# Patient Record
Sex: Male | Born: 1970 | Race: Black or African American | Hispanic: No | Marital: Married | Smoking: Never smoker
Health system: Southern US, Community
[De-identification: ages and names within clinical notes are randomized; demographics above are authoritative.]

## PROBLEM LIST (undated history)

## (undated) DIAGNOSIS — Z87442 Personal history of urinary calculi: Secondary | ICD-10-CM

## (undated) DIAGNOSIS — R7303 Prediabetes: Secondary | ICD-10-CM

## (undated) DIAGNOSIS — K222 Esophageal obstruction: Secondary | ICD-10-CM

## (undated) DIAGNOSIS — K760 Fatty (change of) liver, not elsewhere classified: Secondary | ICD-10-CM

## (undated) DIAGNOSIS — I10 Essential (primary) hypertension: Secondary | ICD-10-CM

## (undated) DIAGNOSIS — F419 Anxiety disorder, unspecified: Secondary | ICD-10-CM

## (undated) DIAGNOSIS — M7989 Other specified soft tissue disorders: Secondary | ICD-10-CM

## (undated) DIAGNOSIS — N189 Chronic kidney disease, unspecified: Secondary | ICD-10-CM

## (undated) DIAGNOSIS — K219 Gastro-esophageal reflux disease without esophagitis: Secondary | ICD-10-CM

## (undated) DIAGNOSIS — M2550 Pain in unspecified joint: Secondary | ICD-10-CM

## (undated) DIAGNOSIS — E7800 Pure hypercholesterolemia, unspecified: Secondary | ICD-10-CM

## (undated) DIAGNOSIS — G4730 Sleep apnea, unspecified: Secondary | ICD-10-CM

## (undated) HISTORY — DX: Esophageal obstruction: K22.2

## (undated) HISTORY — DX: Prediabetes: R73.03

## (undated) HISTORY — PX: KNEE ARTHROPLASTY: SHX992

## (undated) HISTORY — DX: Anxiety disorder, unspecified: F41.9

## (undated) HISTORY — DX: Sleep apnea, unspecified: G47.30

## (undated) HISTORY — DX: Personal history of urinary calculi: Z87.442

## (undated) HISTORY — DX: Pain in unspecified joint: M25.50

## (undated) HISTORY — DX: Pure hypercholesterolemia, unspecified: E78.00

## (undated) HISTORY — DX: Fatty (change of) liver, not elsewhere classified: K76.0

## (undated) HISTORY — DX: Chronic kidney disease, unspecified: N18.9

## (undated) HISTORY — DX: Other specified soft tissue disorders: M79.89

---

## 2000-04-26 ENCOUNTER — Ambulatory Visit (HOSPITAL_BASED_OUTPATIENT_CLINIC_OR_DEPARTMENT_OTHER): Admission: RE | Admit: 2000-04-26 | Discharge: 2000-04-26 | Payer: Self-pay | Admitting: Orthopaedic Surgery

## 2002-07-30 ENCOUNTER — Encounter: Payer: Self-pay | Admitting: Emergency Medicine

## 2002-07-30 ENCOUNTER — Emergency Department (HOSPITAL_COMMUNITY): Admission: EM | Admit: 2002-07-30 | Discharge: 2002-07-30 | Payer: Self-pay | Admitting: Emergency Medicine

## 2004-11-03 ENCOUNTER — Ambulatory Visit: Payer: Self-pay | Admitting: Family Medicine

## 2006-01-23 ENCOUNTER — Ambulatory Visit: Payer: Self-pay | Admitting: Internal Medicine

## 2006-02-19 ENCOUNTER — Ambulatory Visit: Payer: Self-pay | Admitting: Family Medicine

## 2006-03-06 ENCOUNTER — Ambulatory Visit: Payer: Self-pay | Admitting: Gastroenterology

## 2006-03-16 ENCOUNTER — Encounter (INDEPENDENT_AMBULATORY_CARE_PROVIDER_SITE_OTHER): Payer: Self-pay | Admitting: Specialist

## 2006-03-16 ENCOUNTER — Ambulatory Visit: Payer: Self-pay | Admitting: Gastroenterology

## 2006-03-16 HISTORY — PX: COLONOSCOPY: SHX174

## 2006-08-01 ENCOUNTER — Ambulatory Visit: Payer: Self-pay | Admitting: Family Medicine

## 2007-07-12 ENCOUNTER — Encounter: Payer: Self-pay | Admitting: Internal Medicine

## 2008-03-10 ENCOUNTER — Ambulatory Visit: Payer: Self-pay | Admitting: Family Medicine

## 2008-03-10 DIAGNOSIS — K219 Gastro-esophageal reflux disease without esophagitis: Secondary | ICD-10-CM | POA: Insufficient documentation

## 2008-03-10 DIAGNOSIS — R1031 Right lower quadrant pain: Secondary | ICD-10-CM | POA: Insufficient documentation

## 2008-03-10 DIAGNOSIS — Z8679 Personal history of other diseases of the circulatory system: Secondary | ICD-10-CM | POA: Insufficient documentation

## 2008-03-10 DIAGNOSIS — Z87442 Personal history of urinary calculi: Secondary | ICD-10-CM | POA: Insufficient documentation

## 2008-04-14 DIAGNOSIS — K649 Unspecified hemorrhoids: Secondary | ICD-10-CM | POA: Insufficient documentation

## 2008-04-15 ENCOUNTER — Ambulatory Visit: Payer: Self-pay | Admitting: Gastroenterology

## 2008-04-15 DIAGNOSIS — K625 Hemorrhage of anus and rectum: Secondary | ICD-10-CM | POA: Insufficient documentation

## 2009-07-12 ENCOUNTER — Telehealth: Payer: Self-pay | Admitting: Family Medicine

## 2009-09-21 ENCOUNTER — Ambulatory Visit: Payer: Self-pay | Admitting: Family Medicine

## 2009-09-21 DIAGNOSIS — I10 Essential (primary) hypertension: Secondary | ICD-10-CM | POA: Insufficient documentation

## 2009-12-06 ENCOUNTER — Ambulatory Visit: Payer: Self-pay | Admitting: Family Medicine

## 2009-12-06 LAB — CONVERTED CEMR LAB
Bilirubin Urine: NEGATIVE
Blood in Urine, dipstick: NEGATIVE
Glucose, Urine, Semiquant: NEGATIVE
Ketones, urine, test strip: NEGATIVE
Nitrite: NEGATIVE
Specific Gravity, Urine: >=1.03
Urobilinogen, UA: 0.2
WBC Urine, dipstick: NEGATIVE
pH: 6.5

## 2009-12-09 LAB — CONVERTED CEMR LAB
ALT: 33 units/L (ref 0–53)
AST: 29 units/L (ref 0–37)
Albumin: 4.3 g/dL (ref 3.5–5.2)
Alkaline Phosphatase: 63 units/L (ref 39–117)
BUN: 8 mg/dL (ref 6–23)
Basophils Absolute: 0.1 10*3/uL (ref 0.0–0.1)
Basophils Relative: 0.7 % (ref 0.0–3.0)
Bilirubin, Direct: 0 mg/dL (ref 0.0–0.3)
CO2: 28 meq/L (ref 19–32)
Calcium: 9.3 mg/dL (ref 8.4–10.5)
Chloride: 110 meq/L (ref 96–112)
Cholesterol: 193 mg/dL (ref 0–200)
Creatinine, Ser: 1 mg/dL (ref 0.4–1.5)
Eosinophils Absolute: 0.1 10*3/uL (ref 0.0–0.7)
Eosinophils Relative: 1.1 % (ref 0.0–5.0)
GFR calc non Af Amer: 107.03 mL/min (ref >60)
Glucose, Bld: 87 mg/dL (ref 70–99)
HCT: 43.1 % (ref 39.0–52.0)
HDL: 35.7 mg/dL — ABNORMAL LOW (ref >39.00)
Hemoglobin: 14.5 g/dL (ref 13.0–17.0)
LDL Cholesterol: 126 mg/dL — ABNORMAL HIGH (ref 0–99)
Lymphocytes Relative: 25.5 % (ref 12.0–46.0)
Lymphs Abs: 2.2 10*3/uL (ref 0.7–4.0)
MCHC: 33.6 g/dL (ref 30.0–36.0)
MCV: 93.8 fL (ref 78.0–100.0)
Monocytes Absolute: 0.7 10*3/uL (ref 0.1–1.0)
Monocytes Relative: 8.3 % (ref 3.0–12.0)
Neutro Abs: 5.5 10*3/uL (ref 1.4–7.7)
Neutrophils Relative %: 64.4 % (ref 43.0–77.0)
Platelets: 178 10*3/uL (ref 150.0–400.0)
Potassium: 4.1 meq/L (ref 3.5–5.1)
RBC: 4.6 M/uL (ref 4.22–5.81)
RDW: 11.9 % (ref 11.5–14.6)
Sodium: 143 meq/L (ref 135–145)
TSH: 1.36 microintl units/mL (ref 0.35–5.50)
Total Bilirubin: 0.7 mg/dL (ref 0.3–1.2)
Total CHOL/HDL Ratio: 5
Total Protein: 7.4 g/dL (ref 6.0–8.3)
Triglycerides: 156 mg/dL — ABNORMAL HIGH (ref 0.0–149.0)
VLDL: 31.2 mg/dL (ref 0.0–40.0)
WBC: 8.6 10*3/uL (ref 4.5–10.5)

## 2009-12-14 ENCOUNTER — Ambulatory Visit: Payer: Self-pay | Admitting: Family Medicine

## 2010-09-23 ENCOUNTER — Ambulatory Visit: Payer: Self-pay | Admitting: Family Medicine

## 2010-11-22 NOTE — Assessment & Plan Note (Signed)
Summary: BP running high/nn   Vital Signs:  Patient profile:   40 year old male Weight:      227 pounds O2 Sat:      98 % Temp:     98.7 degrees F Pulse rate:   75 / minute BP sitting:   160 / 100  (left arm) Cuff size:   large  Vitals Entered By: Pura Spice, RN (September 23, 2010 10:50 AM) CC: BP   running high c/o headaches   History of Present Illness: Here with his wife for elevated BPs. He has put on some weight in the past few months, and he has not exercised as much as usual due to some plantar fasciitis. he has had some HAs, but no chest pain or SOB.   Allergies (verified): No Known Drug Allergies  Past History:  Past Medical History: GERD Nephrolithiasis, hx of Hx of heart murmur Hypertension plantar fasciitis, sees Dr. Forest Becker  Past Surgical History: Reviewed history from 03/10/2008 and no changes required. Colonoscopy 03-16-06 per Dr. Arlyce Dice, showed nonspecific colitis and int. hemorrhoids Lateral release surgery lt knee 2001  Review of Systems  The patient denies anorexia, fever, weight loss, weight gain, vision loss, decreased hearing, hoarseness, chest pain, syncope, dyspnea on exertion, peripheral edema, prolonged cough, hemoptysis, abdominal pain, melena, hematochezia, severe indigestion/heartburn, hematuria, incontinence, genital sores, muscle weakness, suspicious skin lesions, transient blindness, difficulty walking, depression, unusual weight change, abnormal bleeding, enlarged lymph nodes, angioedema, breast masses, and testicular masses.    Physical Exam  General:  Well-developed,well-nourished,in no acute distress; alert,appropriate and cooperative throughout examination Neck:  No deformities, masses, or tenderness noted. Lungs:  Normal respiratory effort, chest expands symmetrically. Lungs are clear to auscultation, no crackles or wheezes. Heart:  Normal rate and regular rhythm. S1 and S2 normal without gallop, murmur, click, rub or other extra  sounds.   Impression & Recommendations:  Problem # 1:  HYPERTENSION (ICD-401.9)  The following medications were removed from the medication list:    Norvasc 5 Mg Tabs (Amlodipine besylate) ..... Once daily His updated medication list for this problem includes:    Amlodipine Besylate 10 Mg Tabs (Amlodipine besylate) ..... Once daily    Hydrochlorothiazide 25 Mg Tabs (Hydrochlorothiazide) ..... Once daily  Complete Medication List: 1)  Omeprazole 40 Mg Cpdr (Omeprazole) .... Once daily 2)  Zantac 150 Maximum Strength 150 Mg Tabs (Ranitidine hcl) .... At bedtime 3)  Amlodipine Besylate 10 Mg Tabs (Amlodipine besylate) .... Once daily 4)  Hydrochlorothiazide 25 Mg Tabs (Hydrochlorothiazide) .... Once daily  Patient Instructions: 1)  Please schedule a follow-up appointment in 1 month.  2)  It is important that you exercise reguarly at least 20 minutes 5 times a week. If you develop chest pain, have severe difficulty breathing, or feel very tired, stop exercising immediately and seek medical attention.  3)  You need to lose weight. Consider a lower calorie diet and regular exercise.  Prescriptions: HYDROCHLOROTHIAZIDE 25 MG TABS (HYDROCHLOROTHIAZIDE) once daily  #30 x 11   Entered and Authorized by:   Nelwyn Salisbury MD   Signed by:   Nelwyn Salisbury MD on 09/23/2010   Method used:   Electronically to        [facility] [phone]* (retail)       [address]       [place], [place]  [address]       Ph: [phone] or  [phone]       Fax: [phone]   RxID:   [id number] AMLODIPINE BESYLATE 10 MG TABS (AMLODIPINE BESYLATE) once daily  #30 x 11   Entered and Authorized by:   Nelwyn Salisbury MD   Signed by:   Nelwyn Salisbury MD on 09/23/2010   Method used:   Electronically to        [facility] [phone]* (retail)       [address]       [place], [place]  [address]       Ph: [phone] or [phone]       Fax:  [phone]   RxID:   [id number]    Orders Added: 1)  Est. Patient Level IV [16010]

## 2010-11-22 NOTE — Assessment & Plan Note (Signed)
Summary: n/v/bp elevated/njr   Vital Signs:  Patient profile:   40 year old male Weight:      216.8 pounds BMI:     32.13 Temp:     98.9 degrees F oral BP sitting:   130 / 96  (left arm) Cuff size:   large  Vitals Entered By: Alfred Levins, CMA (September 21, 2009 4:19 PM) CC: bp check, pt ran out of norvasc a while back and has not been taking it   History of Present Illness: Here to follow up HTN after an absence of well over a year. He ran out of his meds 6 months ago, and just got around to checking his BP this week. His pressures have been a little high, not surprisingly. he has felt fine, although he had a few days of vomitting and diarrhea last week. This has resolved.   Current Medications (verified): 1)  Zegerid 40-1100 Mg Caps (Omeprazole-Sodium Bicarbonate) .Marland Kitchen.. 1 By Mouth Daily 2)  Norvasc 5 Mg Tabs (Amlodipine Besylate) .... Take 1 Tablet By Mouth Once A Day Needs Ov  Allergies (verified): No Known Drug Allergies  Past History:  Past Medical History: GERD Nephrolithiasis, hx of Hx of heart murmur Hypertension  Review of Systems  The patient denies anorexia, fever, weight loss, weight gain, vision loss, decreased hearing, hoarseness, chest pain, syncope, dyspnea on exertion, peripheral edema, prolonged cough, headaches, hemoptysis, abdominal pain, melena, hematochezia, severe indigestion/heartburn, hematuria, incontinence, genital sores, muscle weakness, suspicious skin lesions, transient blindness, difficulty walking, depression, unusual weight change, abnormal bleeding, enlarged lymph nodes, angioedema, breast masses, and testicular masses.    Physical Exam  General:  Well-developed,well-nourished,in no acute distress; alert,appropriate and cooperative throughout examination Neck:  No deformities, masses, or tenderness noted. Lungs:  Normal respiratory effort, chest expands symmetrically. Lungs are clear to auscultation, no crackles or wheezes. Heart:  Normal  rate and regular rhythm. S1 and S2 normal without gallop, murmur, click, rub or other extra sounds.   Impression & Recommendations:  Problem # 1:  HYPERTENSION (ICD-401.9)  His updated medication list for this problem includes:    Norvasc 5 Mg Tabs (Amlodipine besylate) ..... Once daily  Complete Medication List: 1)  Zegerid 40-1100 Mg Caps (Omeprazole-sodium bicarbonate) .Marland Kitchen.. 1 by mouth daily 2)  Norvasc 5 Mg Tabs (Amlodipine besylate) .... Once daily  Patient Instructions: 1)  Refilled his meds as before. Asked him to set up a cpx with fasting labs sometime soon.  Prescriptions: NORVASC 5 MG TABS (AMLODIPINE BESYLATE) once daily  #30 x 11   Entered and Authorized by:   Nelwyn Salisbury MD   Signed by:   Nelwyn Salisbury MD on 09/21/2009   Method used:   Electronically to        [facility] [phone]* (retail)       [address]       [place], [place]  [address]       Ph: [phone] or [phone]       Fax: [phone]   RxID:   [id number]

## 2010-11-22 NOTE — Miscellaneous (Signed)
  Medications Added NEXIUM 40 MG  PACK (ESOMEPRAZOLE MAGNESIUM) 1 once daily       Clinical Lists Changes  Medications: Added new medication of NEXIUM 40 MG  PACK (ESOMEPRAZOLE MAGNESIUM) 1 once daily

## 2010-11-22 NOTE — Procedures (Signed)
Summary: colonoscopy  Patient Name: Mildred, Bollard. MRN:  Procedure Procedures: Colonoscopy CPT: [phone].    with biopsy. CPT: [id number].  Personnel: Endoscopist: Barbette Hair. Arlyce Dice, MD.  Referred By: Gershon Crane, MD.  Patient Consent: Procedure, Alternatives, Risks and Benefits discussed, consent obtained, from patient.  Indications Symptoms: Diarrhea Hematochezia.  History  Current Medications: Patient is not currently taking Coumadin.  Pre-Exam Physical: Performed Mar 16, 2006. Cardio-pulmonary exam, HEENT exam , Abdominal exam, Mental status exam WNL.  Comments: Patient history reviewed/updated, physical performed prior to initiation of sedation?Yes Exam Exam: Extent of exam reached: Cecum, extent intended: Cecum.  The cecum was identified by IC valve. The Cecum was reached at 8:46 AM. ended at 8: 52 AM. Time for Withdrawl: 00:06. Colon retroflexion performed. ASA Classification: I. Tolerance: good.  Monitoring: Pulse and BP monitoring, Oximetry used. Supplemental O2 given. at 2 Liters.  Colon Prep Used Miralax for colon prep. Prep results: excellent.  Sedation Meds: Patient assessed and found to be appropriate for moderate (conscious) sedation. Sedation was managed by the Endoscopist. Fentanyl 100 mcg. given IV. Versed 10 mg. given IV.  Findings NORMAL EXAM: Cecum.  - MUCOSAL ABNORMALITY: Rectum. Erythema present. Edema present. Biopsy/Mucosal Abn. taken. ICD9: Colitis, Unspecified: 558.9. Comments: Questionable minimal erythema and edema just inside the anal canal.  HEMORRHOIDS: Internal. ICD9: Hemorrhoids, Internal: 455.0.   Assessment Abnormal examination, see findings above.  Diagnoses: 455.0: Hemorrhoids, Internal.  558.9: Colitis, Unspecified.   Events  Unplanned Interventions: No intervention was required.  Unplanned Events: There were no complications. Plans  Post Exam Instructions: Post sedation instructions given.  Medication Plan: Await  pathology. Hemorrhoidal Medications: [place] HC 1 HS, starting Mar 16, 2006 for 10 days.   Disposition: After procedure patient sent to recovery. After recovery patient sent home.  Scheduling/Referral: Office Visit, to [facility]. Arlyce Dice, MD, around Mar 30, 2006.

## 2010-11-22 NOTE — Assessment & Plan Note (Signed)
Summary: RLQ PAIN/YF    History of Present Illness Visit Type: new patient Primary GI MD: Melvia Heaps MD [facility] Primary Provider: Dr. Gershon Crane Requesting Provider: Dr. Gershon Crane Chief Complaint: Patient c/o several weeks RLQ abdominal ache.  Patient says pain in constant and is made worse by having a bowel movement.  Patient notes a large amount of brbpr that last for several days.  He does deny, however, having any change in bowel habits.  Patient also denies any nausea or vomiting History of Present Illness:   Mr. Iglesia is a pleasant 40 year old Afro-American male referred for evaluation of abdominal pain and rectal bleeding. In 2007 he underwent colonoscopy for rectal bleeding and diarrhea. Mild erythema just inside the anal canal was seen. Biopsies were consistent with rectal prolapse. At this time he has very intermittent, mild right lower quadrant pain. There has been no change in his bowel habits. He also has noted moderate amounts of bright red blood per rectum that had lasted 3-4 days that time. The bleeding is not correlated with his abdominal pain. Stools remain solid.    GI Review of Systems    Reports abdominal pain, acid reflux, belching, bloating, and  heartburn.     Location of  Abdominal pain: RLQ.    Denies chest pain, dysphagia with liquids, dysphagia with solids, loss of appetite, nausea, vomiting, vomiting blood, weight loss, and  weight gain.           Prior Medications Reviewed Using: Patient Recall  Updated Prior Medication List: NEXIUM 40 MG  PACK (ESOMEPRAZOLE MAGNESIUM) Take 1 tablet by mouth once a day NORVASC 5 MG TABS (AMLODIPINE BESYLATE) Take 1 tablet by mouth once a day  Current Allergies (reviewed today): No known allergies   Past Medical History:    Reviewed history from 03/10/2008 and no changes required:       GERD       Nephrolithiasis, hx of       Hx of heart murmur  Past Surgical History:    Reviewed history from 03/10/2008 and  no changes required:       Colonoscopy 03-16-06 per Dr. Arlyce Dice, showed nonspecific colitis and int. hemorrhoids       Lateral release surgery lt knee 2001   Family History:    Family History of CAD Male 1st degree relative <50    Family History Diabetes 1st degree relative    Family History High cholesterol    Family History Hypertension    Family History Kidney disease    Family History of Prostate CA 1st degree relative <50    Family History of Colon Cancer: Maternal Aunt    Family History of Pancreatic Cancer: Maternal Grandmother    Family History of Prostate Cancer: Maternal Grandfather    Family History of Colitis/Crohn's: Niece (crohns)    Family History of Diabetes: Mother, Brother, Maternal Grandmother, Maternal Grandfather, Uncles x 2, Aunt x1    Family History of Heart Disease: Mother    Family History of Kidney Disease: Mother  Social History:    Married    Never Smoked    Alcohol use-no    Drug use-no    Occupation:  Social research officer, 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   Risk Factors:  Exercise:  yes   Review of Systems       The patient complains of fatigue.     Vital Signs:  Patient Profile:  40 Years Old Male Height:     69 inches Weight:      223 pounds BMI:     33.05 BSA:     2.17 Pulse rate:   72 / minute Pulse rhythm:   regular BP sitting:   126 / 80  (left arm)  Vitals Entered By: Hortense Ramal CMA (April 15, 2008 9:24 AM)                  Physical Exam  He is a healthy-appearing male  skin: anicteric HEENT: normocephalic; PEERLA; no nasal or pharyngeal abnormalities neck: supple nodes: no cervical lymphadenopathy chest: clear to ausculatation and percussion heart: no murmurs, gallops, or rubs abd: soft, nontender; BS normoactive; no abdominal masses, tenderness, organomegaly rectal: deferred ext: no cynanosis, clubbing, edema skeletal: no deformities neuro: oriented x 3; no  focal abnormalities     Impression & Recommendations:  Problem # 1:  ABDOMINAL PAIN, RIGHT LOWER QUADRANT (ICD-789.03) Pain is quite a nonspecific. I think it is unlikely that he has underlying small or large bowel lesion  Recommendations #1 trial of hyoscyamine  Problem # 2:  RECTAL BLEEDING (ICD-569.3) Rectal bleeding is for a likely to 2 internal hemorrhoids.  Recommendations #1 Anusol HC suppositories #2 I carefully instructed Mr. Robarts to return in approximately one month for reevaluation. If bleeding persists then I would consider sigmoidoscopy and possible band ligation     Prescriptions: HYOSCYAMINE SULFATE 0.125 MG/5ML  ELIX (HYOSCYAMINE SULFATE) 2 tabs sublingual q.4 H. p.r.n.  #15 x 2   Entered and Authorized by:   Louis Meckel MD   Signed by:   Louis Meckel MD on 04/15/2008   Method used:   Electronically sent to ...       [facility] [address]       [address] [place], [place]  [address]       Ph: [phone]       Fax: [phone]   RxID:   [id number] ANUSOL-HC 25 MG  SUPP (HYDROCORTISONE ACETATE) 1 pr at bedtime  #10 x 2   Entered and Authorized by:   Louis Meckel MD   Signed by:   Louis Meckel MD on 04/15/2008   Method used:   Electronically sent to ...       [facility] [address]*       [address] [place], [place]  [address]       Ph: [phone]       Fax: [phone]   RxID:   [id number]  ]

## 2010-11-22 NOTE — Assessment & Plan Note (Signed)
Summary: STOMACH PAIN/MHF   Vital Signs:  Patient Profile:   40 Years Old Male Weight:      227 pounds Temp:     98.6 degrees F oral Pulse rate:   74 / minute Pulse rhythm:   regular BP sitting:   134 / 82  (left arm) Cuff size:   large  Vitals Entered By: Alfred Levins, CMA (Mar 10, 2008 2:08 PM)                 Chief Complaint:  stomach pain, swelling on rt, no nause, and no fever.  History of Present Illness: For the past 2 months has had intermittent burning type pains in the RLQ which sometimes radiate up to the umbilicus. No change in BM's, although he may on occasion have a little blood in the stool. No urinary problems. No fever or nausea. Weight is stable. Appetite normal. Takes Nexium every am, but gets a lot of breakthrough heartburn in the evenings. Had a colonoscopy in 2007 showing internal hemorrhids and nonspecific inflammation. Eating food has no effect on this pain either way.    Past Medical History:    Reviewed history and no changes required:       GERD       Nephrolithiasis, hx of       Hx of heart murmur  Past Surgical History:    Reviewed history and no changes required:       Colonoscopy 03-16-06 per Dr. Arlyce Dice, showed nonspecific colitis and int. hemorrhoids       Lateral release surgery lt knee 2001   Family History:    Family History of CAD Male 1st degree relative <50    Family History Diabetes 1st degree relative    Family History High cholesterol    Family History Hypertension    Family History Kidney disease    Family History of Prostate CA 1st degree relative <50  Social History:    Married    Never Smoked    Alcohol use-no    Drug use-no   Risk Factors:  Tobacco use:  never Drug use:  no Alcohol use:  no   Review of Systems      See HPI   Physical Exam  General:     Well-developed,well-nourished,in no acute distress; alert,appropriate and cooperative throughout examination Abdomen:     soft, normal bowel sounds, no  distention, no masses, no guarding, no rigidity, no rebound tenderness, no abdominal hernia, no hepatomegaly, and no splenomegaly.  Mildly tender in RLQ.    Impression & Recommendations:  Problem # 1:  ABDOMINAL PAIN, RIGHT LOWER QUADRANT (ICD-789.03)  Orders: Gastroenterology Referral (GI)   Problem # 2:  GERD (ICD-530.81)  His updated medication list for this problem includes:    Nexium 40 Mg Pack (Esomeprazole magnesium) .Marland Kitchen... 1 once daily   Complete Medication List: 1)  Nexium 40 Mg Pack (Esomeprazole magnesium) .Marland Kitchen.. 1 once daily 2)  Norvasc 5 Mg Tabs (Amlodipine besylate)   Patient Instructions: 1)  Will try doubling up on Nexium to two times a day for a few weeks. His RLQ pain could represent inflamatory bowel disease. He has a cousin with Crohn's, and he is familiar with it. Will send him back to Dr. Arlyce Dice to evaluate.   ]

## 2010-11-22 NOTE — Progress Notes (Signed)
Summary: pt req refill of Norvasc called in to [facility] on [address]  Phone Note Call from Patient Call back at [phone]   Caller: Patient Call For: Nelwyn Salisbury MD Summary of Call: Pt called and wants refill of Norvasc 5mg . Pt has changed Pharmacies. Please call this in to [facility] on [address] Let pt know when this has been done.  Initial call taken by: Lucy Antigua,  July 12, 2009 1:01 PM  Follow-up for Phone Call        Phone Call Completed, Appt Scheduled Follow-up by: Alfred Levins, CMA,  July 12, 2009 1:33 PM

## 2010-11-22 NOTE — Assessment & Plan Note (Signed)
Summary: CPX/NJR----PT [facility] // RS   Vital Signs:  Patient profile:   40 year old male Height:      69 inches Weight:      223 pounds Temp:     98.9 degrees F oral Pulse rate:   67 / minute BP sitting:   122 / 84  (left arm) Cuff size:   large  Vitals Entered By: Alfred Levins, CMA (December 14, 2009 1:54 PM) CC: cpx   History of Present Illness: 40 yr old male for cpx. He feels good except for some nighttime breakthrough reflux symptoms. he takes OTC Zegerid in the am, and this does a reasonable job during the day. Sometimes he takes TUMS at night.  He remains active and plays basketball 5 days a week.    Current Medications (verified): 1)  Zegerid 40-1100 Mg Caps (Omeprazole-Sodium Bicarbonate) .Marland Kitchen.. 1 By Mouth Daily 2)  Norvasc 5 Mg Tabs (Amlodipine Besylate) .... Once Daily  Allergies (verified): No Known Drug Allergies  Past History:  Past Medical History: Reviewed history from 09/21/2009 and no changes required. GERD Nephrolithiasis, hx of Hx of heart murmur Hypertension  Past Surgical History: Reviewed history from 03/10/2008 and no changes required. Colonoscopy 03-16-06 per Dr. Arlyce Dice, showed nonspecific colitis and int. hemorrhoids Lateral release surgery lt knee 2001  Family History: Reviewed history from 04/15/2008 and no changes required. Family History of CAD Male 1st degree relative <50 Family History Diabetes 1st degree relative Family History High cholesterol Family History Hypertension Family History Kidney disease Family History of Prostate CA 1st degree relative <50 Family History of Colon Cancer: Maternal Aunt Family History of Pancreatic Cancer: Maternal Grandmother Family History of Prostate Cancer: Maternal Grandfather Family History of Colitis/Crohn's: Niece (crohns) Family History of Diabetes: Mother, Brother, Maternal Grandmother, Maternal Grandfather, Uncles x 2, Aunt x1 Family History of Heart Disease: Mother Family History of Kidney  Disease: Mother  Social History: Reviewed history from 04/15/2008 and no changes required. 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  Review of Systems  The patient denies anorexia, fever, weight loss, weight gain, vision loss, decreased hearing, hoarseness, chest pain, syncope, dyspnea on exertion, peripheral edema, prolonged cough, headaches, hemoptysis, abdominal pain, melena, hematochezia, severe indigestion/heartburn, hematuria, incontinence, genital sores, muscle weakness, suspicious skin lesions, transient blindness, difficulty walking, depression, unusual weight change, abnormal bleeding, enlarged lymph nodes, angioedema, breast masses, and testicular masses.    Physical Exam  General:  Well-developed,well-nourished,in no acute distress; alert,appropriate and cooperative throughout examination Head:  Normocephalic and atraumatic without obvious abnormalities. No apparent alopecia or balding. Eyes:  No corneal or conjunctival inflammation noted. EOMI. Perrla. Funduscopic exam benign, without hemorrhages, exudates or papilledema. Vision grossly normal. Ears:  External ear exam shows no significant lesions or deformities.  Otoscopic examination reveals clear canals, tympanic membranes are intact bilaterally without bulging, retraction, inflammation or discharge. Hearing is grossly normal bilaterally. Nose:  External nasal examination shows no deformity or inflammation. Nasal mucosa are pink and moist without lesions or exudates. Mouth:  Oral mucosa and oropharynx without lesions or exudates.  Teeth in good repair. Neck:  No deformities, masses, or tenderness noted. Chest Wall:  No deformities, masses, tenderness or gynecomastia noted. Lungs:  Normal respiratory effort, chest expands symmetrically. Lungs are clear to auscultation, no crackles or wheezes. Heart:  Normal rate  and regular rhythm. S1 and S2 normal without gallop, murmur, click, rub or other extra sounds. Abdomen:  Bowel sounds positive,abdomen soft and non-tender without masses, organomegaly or hernias noted. Genitalia:  Testes bilaterally descended without nodularity, tenderness or masses. No scrotal masses or lesions. No penis lesions or urethral discharge. Msk:  No deformity or scoliosis noted of thoracic or lumbar spine.   Pulses:  R and L carotid,radial,femoral,dorsalis pedis and posterior tibial pulses are full and equal bilaterally Extremities:  No clubbing, cyanosis, edema, or deformity noted with normal full range of motion of all joints.   Neurologic:  No cranial nerve deficits noted. Station and gait are normal. Plantar reflexes are down-going bilaterally. DTRs are symmetrical throughout. Sensory, motor and coordinative functions appear intact. Skin:  Intact without suspicious lesions or rashes. has some vitiligo on the genitals Cervical Nodes:  No lymphadenopathy noted Axillary Nodes:  No palpable lymphadenopathy Inguinal Nodes:  No significant adenopathy Psych:  Cognition and judgment appear intact. Alert and cooperative with normal attention span and concentration. No apparent delusions, illusions, hallucinations   Impression & Recommendations:  Problem # 1:  HEALTH MAINTENANCE EXAM (ICD-V70.0)  Complete Medication List: 1)  Norvasc 5 Mg Tabs (Amlodipine besylate) .... Once daily 2)  Omeprazole 40 Mg Cpdr (Omeprazole) .... Once daily 3)  Zantac 150 Maximum Strength 150 Mg Tabs (Ranitidine hcl) .... At bedtime  Patient Instructions: 1)  try adding some Zantac at night 2)  Please schedule a follow-up appointment in 6 months .  Prescriptions: OMEPRAZOLE 40 MG CPDR (OMEPRAZOLE) once daily  #30 x 11   Entered and Authorized by:   Nelwyn Salisbury MD   Signed by:   Nelwyn Salisbury MD on 12/14/2009   Method used:   Electronically to        [facility] [phone]* (retail)        [address]       [place], [place]  [address]       Ph: [phone] or [phone]       Fax: [phone]   RxID:   [id number]

## 2010-12-26 ENCOUNTER — Other Ambulatory Visit: Payer: Self-pay | Admitting: Family Medicine

## 2011-03-06 ENCOUNTER — Emergency Department (HOSPITAL_COMMUNITY): Payer: 59

## 2011-03-06 ENCOUNTER — Other Ambulatory Visit: Payer: Self-pay | Admitting: Orthopedic Surgery

## 2011-03-06 ENCOUNTER — Emergency Department (HOSPITAL_COMMUNITY)
Admission: EM | Admit: 2011-03-06 | Discharge: 2011-03-06 | Disposition: A | Discharge status: Home / Self Care | Payer: 59 | Attending: Emergency Medicine | Admitting: Emergency Medicine

## 2011-03-06 DIAGNOSIS — X500XXA Overexertion from strenuous movement or load, initial encounter: Secondary | ICD-10-CM | POA: Insufficient documentation

## 2011-03-06 DIAGNOSIS — K219 Gastro-esophageal reflux disease without esophagitis: Secondary | ICD-10-CM | POA: Insufficient documentation

## 2011-03-06 DIAGNOSIS — M25569 Pain in unspecified knee: Secondary | ICD-10-CM | POA: Insufficient documentation

## 2011-03-06 DIAGNOSIS — Y9367 Activity, basketball: Secondary | ICD-10-CM | POA: Insufficient documentation

## 2011-03-06 DIAGNOSIS — M25469 Effusion, unspecified knee: Secondary | ICD-10-CM | POA: Insufficient documentation

## 2011-03-06 DIAGNOSIS — M25562 Pain in left knee: Secondary | ICD-10-CM

## 2011-03-06 DIAGNOSIS — IMO0002 Reserved for concepts with insufficient information to code with codable children: Secondary | ICD-10-CM | POA: Insufficient documentation

## 2011-03-06 DIAGNOSIS — I10 Essential (primary) hypertension: Secondary | ICD-10-CM | POA: Insufficient documentation

## 2011-03-09 ENCOUNTER — Ambulatory Visit
Admission: RE | Admit: 2011-03-09 | Discharge: 2011-03-09 | Disposition: A | Discharge status: Home / Self Care | Payer: 59 | Source: Ambulatory Visit | Attending: Orthopedic Surgery | Admitting: Orthopedic Surgery

## 2011-03-09 DIAGNOSIS — M25562 Pain in left knee: Secondary | ICD-10-CM

## 2011-03-10 NOTE — Op Note (Signed)
[place]  Patient:    Andrew Sharp, Andrew Sharp                     MRN: [id number] Proc. Date: 04/26/00 Attending:  Lubertha Basque. Jerl Santos, M.D.                           Operative Report  PREOPERATIVE DIAGNOSIS:  Left knee chondromalacia patella.  POSTOPERATIVE DIAGNOSIS:  Left knee chondromalacia patella.  PROCEDURES: 1. Left knee chondroplasty. 2. Left knee arthroscopic lateral release.  ANESTHESIA:  Knee block.  ATTENDING SURGEON:  Lubertha Basque. Jerl Santos, M.D.  ASSISTANT:  Prince Rome, P.A.  INDICATIONS:  The patient is a 40 year old man with a very lengthy history of left anterior knee pain.  This has persisted despite extensive physical therapy, bracing, oral anti-inflammatories and activity restriction.  At this point, he is OFFERED operative intervention to consist of an arthroscopy with a lateral release.  The procedure was discussed with the patient and informed operative consent was obtained after discussion of possible complications of reaction to anesthesia and infection.  DESCRIPTION OF PROCEDURE:  The patient was taken to the operating suite, where a knee block anesthetic was applied without difficulty. He was positioned supine and prepped and draped in normal sterile fashion.  After the administration of preoperative IV antibiotics, an arthroscopy of the left knee was performed through a total of three portals.  Suprapatellar pouch was benign, while the patellofemoral joint tracked did track in a slightly lateral position.  He had some grade 3 chondromalacia of the inferior pole and the medial aspect, which was addressed with a thorough chondroplasty.  There was no exposed bone.  Medial and lateral compartment exhibited no evidence of meniscal or articular cartilage injury.  ACL and PCL were intact.  A lateral release was performed through the additional third portal.  Once this was accomplished, the knee cap did track in a  central position.  The knee was thoroughly irrigated at the end of the case.  Pump pressure was decreased and some small bleeders along the release site were cauterized.  Marcaine with epinephrine and morphine were injected at the end of the case.  Adaptic was placed on the portals, followed by dry gauze and loose Ace wrap.  Estimated blood loss and intraoperative fluids can be obtained from anesthesia records.  DISPOSITION:  The patient was taken to recovery room in stable condition.  PLANS:  For him to go home same day and follow up in the office in less than a week.  I will contact him by phone tonight. DD:  04/26/00 TD:  04/26/00 Job: [id number]

## 2011-09-19 ENCOUNTER — Observation Stay (HOSPITAL_COMMUNITY)
Admission: EM | Admit: 2011-09-19 | Discharge: 2011-09-19 | Disposition: A | Discharge status: Home / Self Care | Payer: 59 | Attending: Emergency Medicine | Admitting: Emergency Medicine

## 2011-09-19 ENCOUNTER — Emergency Department (HOSPITAL_COMMUNITY): Payer: 59

## 2011-09-19 DIAGNOSIS — R079 Chest pain, unspecified: Principal | ICD-10-CM | POA: Insufficient documentation

## 2011-09-19 DIAGNOSIS — K219 Gastro-esophageal reflux disease without esophagitis: Secondary | ICD-10-CM | POA: Insufficient documentation

## 2011-09-19 DIAGNOSIS — I10 Essential (primary) hypertension: Secondary | ICD-10-CM | POA: Insufficient documentation

## 2011-09-19 HISTORY — DX: Essential (primary) hypertension: I10

## 2011-09-19 HISTORY — PX: CT CORONARY CA SCORING: HXRAD806

## 2011-09-19 HISTORY — DX: Gastro-esophageal reflux disease without esophagitis: K21.9

## 2011-09-19 LAB — CBC
HCT: 42 % (ref 39.0–52.0)
Hemoglobin: 15 g/dL (ref 13.0–17.0)
MCH: 30.9 pg (ref 26.0–34.0)
MCHC: 35.7 g/dL (ref 30.0–36.0)
MCV: 86.4 fL (ref 78.0–100.0)
Platelets: 206 10*3/uL (ref 150–400)
RBC: 4.86 MIL/uL (ref 4.22–5.81)
RDW: 12.6 % (ref 11.5–15.5)
WBC: 6.9 10*3/uL (ref 4.0–10.5)

## 2011-09-19 LAB — DIFFERENTIAL
Basophils Absolute: 0 10*3/uL (ref 0.0–0.1)
Basophils Relative: 0 % (ref 0–1)
Eosinophils Absolute: 0.2 10*3/uL (ref 0.0–0.7)
Eosinophils Relative: 3 % (ref 0–5)
Lymphocytes Relative: 34 % (ref 12–46)
Lymphs Abs: 2.3 10*3/uL (ref 0.7–4.0)
Monocytes Absolute: 0.7 10*3/uL (ref 0.1–1.0)
Monocytes Relative: 10 % (ref 3–12)
Neutro Abs: 3.7 10*3/uL (ref 1.7–7.7)
Neutrophils Relative %: 53 % (ref 43–77)

## 2011-09-19 LAB — BASIC METABOLIC PANEL
BUN: 11 mg/dL (ref 6–23)
CO2: 26 mEq/L (ref 19–32)
Calcium: 9.2 mg/dL (ref 8.4–10.5)
Chloride: 103 mEq/L (ref 96–112)
Creatinine, Ser: 0.87 mg/dL (ref 0.50–1.35)
GFR calc Af Amer: >90 mL/min (ref >90)
GFR calc non Af Amer: >90 mL/min (ref >90)
Glucose, Bld: 114 mg/dL — ABNORMAL HIGH (ref 70–99)
Potassium: 3.4 mEq/L — ABNORMAL LOW (ref 3.5–5.1)
Sodium: 138 mEq/L (ref 135–145)

## 2011-09-19 LAB — POCT I-STAT TROPONIN I: Troponin i, poc: 0 ng/mL (ref 0.00–0.08)

## 2011-09-19 LAB — TROPONIN I: Troponin I: <0.3 ng/mL (ref <0.30)

## 2011-09-19 MED ORDER — METOPROLOL TARTRATE 25 MG PO TABS
100.0000 mg | ORAL_TABLET | Freq: Once | ORAL | Status: AC
  Administered 2011-09-19: 100 mg via ORAL
  Filled 2011-09-19: qty 4

## 2011-09-19 MED ORDER — MORPHINE SULFATE 4 MG/ML IJ SOLN
4.0000 mg | Freq: Once | INTRAMUSCULAR | Status: AC
  Administered 2011-09-19: 4 mg via INTRAVENOUS
  Filled 2011-09-19: qty 1

## 2011-09-19 MED ORDER — NITROGLYCERIN 0.4 MG SL SUBL
0.4000 mg | SUBLINGUAL_TABLET | Freq: Once | SUBLINGUAL | Status: AC
  Administered 2011-09-19: 0.4 mg via SUBLINGUAL
  Filled 2011-09-19: qty 25

## 2011-09-19 MED ORDER — METOPROLOL TARTRATE 1 MG/ML IV SOLN
5.0000 mg | Freq: Once | INTRAVENOUS | Status: AC
  Administered 2011-09-19: 5 mg via INTRAVENOUS
  Filled 2011-09-19: qty 10

## 2011-09-19 MED ORDER — SODIUM CHLORIDE 0.9 % IV SOLN
INTRAVENOUS | Status: DC
  Administered 2011-09-19: 14:00:00 via INTRAVENOUS
  Administered 2011-09-19: 1000 mL via INTRAVENOUS

## 2011-09-19 MED ORDER — IOHEXOL 350 MG/ML SOLN
80.0000 mL | Freq: Once | INTRAVENOUS | Status: AC | PRN
  Administered 2011-09-19: 80 mL via INTRAVENOUS

## 2011-09-19 MED ORDER — MORPHINE SULFATE 4 MG/ML IJ SOLN
4.0000 mg | Freq: Once | INTRAMUSCULAR | Status: AC
  Administered 2011-09-19: 4 mg via INTRAVENOUS
  Filled 2011-09-19 (×2): qty 1

## 2011-09-19 NOTE — Progress Notes (Signed)
Observation review completed. 

## 2011-09-19 NOTE — ED Provider Notes (Signed)
Medical screening examination/treatment/procedure(s) were conducted as a shared visit with non-physician practitioner(s) and myself.  I personally evaluated the patient during the encounter   Chapin Arduini A Aking Klabunde, MD 09/19/11 1551 

## 2011-09-19 NOTE — ED Provider Notes (Signed)
Pt with chest pressure, low risk for CAD except family history at young age in mother and grandparents.  Pt has normal ECG.  Will plan to place in CDU chest pain protocol for coronary CT.  Pt with normal physical exam.    Nat Christen, MD 09/19/11 1311

## 2011-09-19 NOTE — ED Notes (Signed)
Chest pain began last night, constant pressure radaites into lt. Arm  Denies any n/v/ or sob, skin is w/d,. Resp.e/u

## 2011-09-19 NOTE — ED Notes (Signed)
Pt here with c/o left sided chest pain under left breast as well as pain to back side of left upper arm.  Pt describes the pain as a nagging aches and reports that it has been constant since yesterday.  Pt denies any other symptoms and rates pain 6/10.

## 2011-09-19 NOTE — ED Provider Notes (Signed)
History     CSN: [id number] Arrival date & time: 09/19/2011 10:38 AM   First MD Initiated Contact with Patient 09/19/11 1101      Chief Complaint  Patient presents with  . Chest Pain    chest pain began last night constant radiates into lt. arm denies any sob or nausea     (Consider location/radiation/quality/duration/timing/severity/associated sxs/prior treatment) HPI  Past Medical History  Diagnosis Date  . Hypertension   . GERD (gastroesophageal reflux disease)     Past Surgical History  Procedure Date  . Knee arthroplasty     History reviewed. No pertinent family history.  History  Substance Use Topics  . Smoking status: Never Smoker   . Smokeless tobacco: Not on file  . Alcohol Use: No      Review of Systems  Allergies  Review of patient's allergies indicates no known allergies.  Home Medications   Current Outpatient Rx  Name Route Sig Dispense Refill  . AMLODIPINE BESYLATE 10 MG PO TABS Oral Take 10 mg by mouth daily.      Marland Kitchen HYDROCHLOROTHIAZIDE 25 MG PO TABS Oral Take 25 mg by mouth daily.      Marland Kitchen OMEPRAZOLE 40 MG PO CPDR  TAKE 1 CAPSULE BY MOUTH ONCE DAILY 30 capsule 11    BP 148/90  Pulse 80  Temp(Src) 98.5 F (36.9 C) (Oral)  Resp 20  Ht 5\' 9"  (1.753 m)  Wt 230 lb (104.327 kg)  BMI 33.96 kg/m2  SpO2 97%  Physical Exam  ED Course  Procedures (including critical care time)  Labs Reviewed  BASIC METABOLIC PANEL - Abnormal; Notable for the following:    Potassium 3.4 (*)    Glucose, Bld 114 (*)    All other components within normal limits  CBC  DIFFERENTIAL  POCT I-STAT TROPONIN I  I-STAT TROPONIN I  TROPONIN I   Dg Chest 2 View  09/19/2011  *RADIOLOGY REPORT*  Clinical Data: Chest pain.  Cough.  Hypertension.  CHEST - 2 VIEW  Comparison: None.  Findings: Poor inspiration.  Grossly normal sized heart.  Minimal linear density in the right lower lung zone.  Otherwise, clear lungs.  Lower thoracic spine degenerative changes.   IMPRESSION: Poor inspiration with minimal right basilar atelectasis.  Original Report Authenticated By: Darrol Angel, M.D.     No diagnosis found.    MDM          Nat Christen, MD 09/19/11 [id number]

## 2011-09-19 NOTE — ED Provider Notes (Signed)
History     CSN: [id number] Arrival date & time: 09/19/2011 10:38 AM   First MD Initiated Contact with Patient 09/19/11 1101      Chief Complaint  Patient presents with  . Chest Pain    chest pain began last night constant radiates into lt. arm denies any sob or nausea     (Consider location/radiation/quality/duration/timing/severity/associated sxs/prior treatment) Patient is a 40 y.o. male presenting with chest pain. The history is provided by the patient.  Chest Pain The chest pain began yesterday. Chest pain occurs constantly. The chest pain is resolved. At its most intense, the pain is at 8/10. The pain is currently at 0/10. The severity of the pain is moderate. The quality of the pain is described as pressure-like. The pain radiates to the left arm. Pertinent negatives for primary symptoms include no fever, no fatigue, no syncope, no shortness of breath, no cough, no wheezing, no palpitations, no abdominal pain, no nausea, no vomiting, no dizziness and no altered mental status.  Pertinent negatives for associated symptoms include no claudication, no diaphoresis, no lower extremity edema, no near-syncope, no numbness, no orthopnea, no paroxysmal nocturnal dyspnea and no weakness. He tried aspirin for the symptoms. Risk factors include no known risk factors.  Pertinent negatives for past medical history include no aortic dissection, no arrhythmia, no COPD, no CHF, no hypertension, no pacemaker, no PE and no TIA.     Past Medical History  Diagnosis Date  . Hypertension   . GERD (gastroesophageal reflux disease)     Past Surgical History  Procedure Date  . Knee arthroplasty     History reviewed. No pertinent family history.  History  Substance Use Topics  . Smoking status: Never Smoker   . Smokeless tobacco: Not on file  . Alcohol Use: No      Review of Systems  Constitutional: Negative for fever, diaphoresis and fatigue.  Respiratory: Negative for cough, shortness of  breath and wheezing.   Cardiovascular: Positive for chest pain. Negative for palpitations, orthopnea, claudication, syncope and near-syncope.  Gastrointestinal: Negative for nausea, vomiting and abdominal pain.  Neurological: Negative for dizziness, weakness and numbness.  Psychiatric/Behavioral: Negative for altered mental status.    Allergies  Review of patient's allergies indicates no known allergies.  Home Medications   Current Outpatient Rx  Name Route Sig Dispense Refill  . AMLODIPINE BESYLATE 10 MG PO TABS Oral Take 10 mg by mouth daily.      Marland Kitchen HYDROCHLOROTHIAZIDE 25 MG PO TABS Oral Take 25 mg by mouth daily.      Marland Kitchen OMEPRAZOLE 40 MG PO CPDR  TAKE 1 CAPSULE BY MOUTH ONCE DAILY 30 capsule 11    BP 148/90  Pulse 80  Temp(Src) 98.5 F (36.9 C) (Oral)  Resp 20  Ht 5\' 9"  (1.753 m)  Wt 230 lb (104.327 kg)  BMI 33.96 kg/m2  SpO2 97%  Physical Exam  Nursing note and vitals reviewed. Constitutional: He is oriented to person, place, and time. He appears well-developed and well-nourished.  HENT:  Head: Normocephalic and atraumatic.  Eyes: EOM are normal. Pupils are equal, round, and reactive to light.  Neck: Normal range of motion.  Cardiovascular: Normal rate and regular rhythm.   Pulmonary/Chest: Effort normal and breath sounds normal.  Abdominal: Soft. Bowel sounds are normal.  Musculoskeletal: Normal range of motion.  Neurological: He is alert and oriented to person, place, and time.  Skin: Skin is warm and dry.    ED Course  Procedures (including critical  care time)   Labs Reviewed  BASIC METABOLIC PANEL  CBC  DIFFERENTIAL  I-STAT TROPONIN I   No results found.   No diagnosis found.    MDM   Date: 09/19/2011  Rate: 86  Rhythm: normal sinus rhythm  QRS Axis: right  Intervals: normal  ST/T Wave abnormalities: nonspecific T wave changes  Conduction Disutrbances:none  Narrative Interpretation:   Old EKG Reviewed: none available    PT HAS BEEN  PLACED ON CHEST PAIN PROTOCOL AND MOVED TO CDU.       Dorthula Matas, PA 09/19/11 1350

## 2011-09-19 NOTE — ED Provider Notes (Signed)
Medical screening examination/treatment/procedure(s) were conducted as a shared visit with non-physician practitioner(s) and myself.  I personally evaluated the patient during the encounter   Nat Christen, MD 09/19/11 [phone]

## 2011-09-19 NOTE — ED Notes (Signed)
Patient transported to CT 

## 2011-09-19 NOTE — ED Provider Notes (Signed)
Pt care resumed from [facility] & Dr. Golda Acre. Plan is to rule out acute cardiac causes of CP via the CDU protocol. Pt is currently stable and has no complaints  EXTRACARDIAC FINDINGS: No adenopathy in the visualized mediastinum or hila. Heart is normal size. Lungs are clear. No pleural effusions. Early degenerative changes in the thoracic spine. No focal bony abnormality. Visualized pulmonary arteries and veins unremarkable.  IMPRESSION:  1. No significant coronary artery disease. The patient's total coronary artery calcium score is 0, which is 0 percentile for patient's matched age and gender. 2. No significant extracardiac abnormality. 3. Right coronary artery dominance.  Pt states he has no CP SOB or other complaints currently; Stable and will follow up with Dr. Abran Cantor.    Andrew Sharp, [place] 09/19/11 1452

## 2011-09-25 ENCOUNTER — Other Ambulatory Visit: Payer: Self-pay | Admitting: Family Medicine

## 2011-09-27 ENCOUNTER — Ambulatory Visit: Payer: 59 | Admitting: Family Medicine

## 2011-10-03 ENCOUNTER — Ambulatory Visit: Payer: 59 | Admitting: Family Medicine

## 2011-10-03 DIAGNOSIS — Z0289 Encounter for other administrative examinations: Secondary | ICD-10-CM

## 2011-12-17 ENCOUNTER — Other Ambulatory Visit: Payer: Self-pay | Admitting: Family Medicine

## 2011-12-18 NOTE — Telephone Encounter (Signed)
Can we refill this? 

## 2012-01-01 ENCOUNTER — Encounter: Payer: Self-pay | Admitting: Family Medicine

## 2012-01-01 ENCOUNTER — Ambulatory Visit (INDEPENDENT_AMBULATORY_CARE_PROVIDER_SITE_OTHER): Payer: 59 | Admitting: Family Medicine

## 2012-01-01 VITALS — BP 134/88 | HR 74 | Temp 98.3°F | Wt 238.0 lb

## 2012-01-01 DIAGNOSIS — K219 Gastro-esophageal reflux disease without esophagitis: Secondary | ICD-10-CM

## 2012-01-01 MED ORDER — OMEPRAZOLE 40 MG PO CPDR: 40.0000 mg | DELAYED_RELEASE_CAPSULE | Freq: Two times a day (BID) | ORAL | Status: DC

## 2012-01-01 NOTE — Progress Notes (Signed)
  Subjective:    Patient ID: Andrew Sharp, male    DOB: 12-24-1970, 40 y.o.   MRN: [id number]  HPI Here for 2 weeks of intermittent chest pressure and choking immediately after eating a meal. It feels like his food is coming back up into his mouth. No SOB or nausea. He takes Omeprazole once a day in the morning.    Review of Systems  Constitutional: Negative.   HENT: Positive for trouble swallowing. Negative for sore throat.   Eyes: Negative.   Respiratory: Negative.   Cardiovascular: Negative.   Gastrointestinal: Negative.        Objective:   Physical Exam  Constitutional: He appears well-developed and well-nourished.  Neck: No thyromegaly present.  Cardiovascular: Normal rate, regular rhythm, normal heart sounds and intact distal pulses.   Pulmonary/Chest: Effort normal and breath sounds normal.  Lymphadenopathy:    He has no cervical adenopathy.          Assessment & Plan:  This is probably either an esophageal stricture or a spasm, due to chronic GERD. Increase Omeprazole to bid before meals. Refer to GI, since he will likely need upper endoscopy.

## 2012-01-02 ENCOUNTER — Encounter: Payer: Self-pay | Admitting: Gastroenterology

## 2012-02-02 ENCOUNTER — Telehealth: Payer: Self-pay | Admitting: Family Medicine

## 2012-02-02 NOTE — Telephone Encounter (Signed)
Pt called and said that when he pick up his script for omeprazole (PRILOSEC) 40 MG capsule, the pharmacy left the instructions as take 1 pill a day, but pt was instructed by Dr Clent Ridges to take 1 pill two times a day, so pt is running out of med too early. Pt is needing to get a new script called in to [facility] on [address]. Pls call the pharmacy and make sure that the instructions are corrected.

## 2012-02-05 ENCOUNTER — Encounter: Payer: Self-pay | Admitting: Gastroenterology

## 2012-02-05 ENCOUNTER — Ambulatory Visit (INDEPENDENT_AMBULATORY_CARE_PROVIDER_SITE_OTHER): Payer: 59 | Admitting: Gastroenterology

## 2012-02-05 VITALS — BP 124/82 | HR 64 | Ht 69.0 in | Wt 247.0 lb

## 2012-02-05 DIAGNOSIS — R1310 Dysphagia, unspecified: Secondary | ICD-10-CM | POA: Insufficient documentation

## 2012-02-05 DIAGNOSIS — K625 Hemorrhage of anus and rectum: Secondary | ICD-10-CM

## 2012-02-05 DIAGNOSIS — K219 Gastro-esophageal reflux disease without esophagitis: Secondary | ICD-10-CM

## 2012-02-05 MED ORDER — OMEPRAZOLE 40 MG PO CPDR: 40.0000 mg | DELAYED_RELEASE_CAPSULE | Freq: Two times a day (BID) | ORAL | Status: DC

## 2012-02-05 MED ORDER — DEXLANSOPRAZOLE 60 MG PO CPDR: 60.0000 mg | DELAYED_RELEASE_CAPSULE | Freq: Two times a day (BID) | ORAL | Status: DC

## 2012-02-05 NOTE — Progress Notes (Signed)
History of Present Illness:  Andrew Sharp is a pleasant 41 year old white male referred at the request of Dr. Clent Ridges for evaluation of dysphagia and reflux.  Despite taking omeprazole twice a day he has frequent regurgitation of gastric contents. He complains of dysphagia to solids. He is on no gastric irritants including nonsteroidals.  About once every month or 2 he has minimal blood in the toilet water with the bowel movement. Colonoscopy in 2007 for similar complaints demonstrated mild erythema in the rectal vault with pathology showing possible rectal prolapse syndrome. He is without rectal pain.    Past Medical History  Diagnosis Date  . Hypertension   . GERD (gastroesophageal reflux disease)   . History of kidney stones    Past Surgical History  Procedure Date  . Knee arthroplasty     left  . Ct coronary ca scoring 09-19-11    for chest pains, negative   . Colonoscopy 03-16-06    per Dr. Arlyce Dice, nonspecific colitis and int. hemorrhoids   family history includes Colon cancer in his maternal aunt; Crohn's disease in his other; Diabetes in his brother; Heart disease in his mother; Kidney disease in his mother; Pancreatic cancer in his maternal grandmother; and Prostate cancer in his maternal grandfather. Current Outpatient Prescriptions  Medication Sig Dispense Refill  . amLODipine (NORVASC) 10 MG tablet TAKE 1 TABLET BY MOUTH ONCE DAILY  30 tablet  11  . hydrochlorothiazide (HYDRODIURIL) 25 MG tablet TAKE 1 TABLET BY MOUTH ONCE DAILY  30 tablet  11  . omeprazole (PRILOSEC) 40 MG capsule Take 1 capsule (40 mg total) by mouth 2 (two) times daily.  60 capsule  0   Allergies as of 02/05/2012  . (No Known Allergies)    reports that he has never smoked. He has never used smokeless tobacco. He reports that he does not drink alcohol or use illicit drugs.     Review of Systems: Pertinent positive and negative review of systems were noted in the above HPI section. All other review of  systems were otherwise negative.  Vital signs were reviewed in today's medical record Physical Exam: General: Well developed , well nourished, no acute distress Head: Normocephalic and atraumatic Eyes:  sclerae anicteric, EOMI Ears: Normal auditory acuity Mouth: No deformity or lesions Neck: Supple, no masses or thyromegaly Lungs: Clear throughout to auscultation Heart: Regular rate and rhythm; no murmurs, rubs or bruits Abdomen: Soft, non tender and non distended. No masses, hepatosplenomegaly or hernias noted. Normal Bowel sounds Rectal:deferred Musculoskeletal: Symmetrical with no gross deformities  Skin: No lesions on visible extremities Pulses:  Normal pulses noted Extremities: No clubbing, cyanosis, edema or deformities noted Neurological: Alert oriented x 4, grossly nonfocal Cervical Nodes:  No significant cervical adenopathy Inguinal Nodes: No significant inguinal adenopathy Psychological:  Alert and cooperative. Normal mood and affect

## 2012-02-05 NOTE — Assessment & Plan Note (Signed)
He remains symptomatic despite twice daily omeprazole  Recommendations #1 trial of Dexilant 60 mg twice a day

## 2012-02-05 NOTE — Patient Instructions (Signed)

## 2012-02-05 NOTE — Telephone Encounter (Signed)
Script sent e-scribe 

## 2012-02-05 NOTE — Assessment & Plan Note (Signed)
Rule out esophageal stricture  Recommendations #1 upper endoscopy with dilatation as indicated 

## 2012-02-05 NOTE — Assessment & Plan Note (Signed)
Minimal rectal bleeding is probably due to hemorrhoids. Will manage expectantly.

## 2012-02-06 ENCOUNTER — Ambulatory Visit (AMBULATORY_SURGERY_CENTER): Payer: 59 | Admitting: Gastroenterology

## 2012-02-06 ENCOUNTER — Encounter: Payer: Self-pay | Admitting: Gastroenterology

## 2012-02-06 VITALS — BP 119/78 | HR 79 | Temp 97.3°F | Resp 20 | Ht 69.0 in | Wt 247.0 lb

## 2012-02-06 DIAGNOSIS — K219 Gastro-esophageal reflux disease without esophagitis: Secondary | ICD-10-CM

## 2012-02-06 DIAGNOSIS — K222 Esophageal obstruction: Secondary | ICD-10-CM

## 2012-02-06 DIAGNOSIS — R1310 Dysphagia, unspecified: Secondary | ICD-10-CM

## 2012-02-06 MED ORDER — SODIUM CHLORIDE 0.9 % IV SOLN: 500.0000 mL | INTRAVENOUS | Status: DC

## 2012-02-06 NOTE — Progress Notes (Signed)
Patient did not experience any of the following events: a burn prior to discharge; a fall within the facility; wrong site/side/patient/procedure/implant event; or a hospital transfer or hospital admission upon discharge from the facility. (G8907) Patient did not have preoperative order for IV antibiotic SSI prophylaxis. (G8918)  

## 2012-02-06 NOTE — Patient Instructions (Addendum)
DILATATION DIET:  NOTHING TO EAT OR DRINK UNTIL 4:15 PM. 4:15 UNTIL 5:15 PM CLEAR LIQUIDS ONLY. 5:15 PM UNTIL MORNING SOFT FOODS ONLY. RESUME YOUR DIET IN AM.  CONTINUE YOUR DEXILANT.  CALL THE OFFICE FOR APPOINTMENT IN ONE MONTH. [phone].YOU HAD AN ENDOSCOPIC PROCEDURE TODAY AT THE [facility]: Refer to the procedure report that was given to you for any specific questions about what was found during the examination.  If the procedure report does not answer your questions, please call your gastroenterologist to clarify.  If you requested that your care partner not be given the details of your procedure findings, then the procedure report has been included in a sealed envelope for you to review at your convenience later.  YOU SHOULD EXPECT: Some feelings of bloating in the abdomen. Passage of more gas than usual.  Walking can help get rid of the air that was put into your GI tract during the procedure and reduce the bloating. If you had a lower endoscopy (such as a colonoscopy or flexible sigmoidoscopy) you may notice spotting of blood in your stool or on the toilet paper. If you underwent a bowel prep for your procedure, then you may not have a normal bowel movement for a few days.    Drink plenty of fluids but you should avoid alcoholic beverages for 24 hours.  ACTIVITY: Your care partner should take you home directly after the procedure.  You should plan to take it easy, moving slowly for the rest of the day.  You can resume normal activity the day after the procedure however you should NOT DRIVE or use heavy machinery for 24 hours (because of the sedation medicines used during the test).    SYMPTOMS TO REPORT IMMEDIATELY: A gastroenterologist can be reached at any hour.  During normal business hours, 8:30 AM to 5:00 PM Monday through Friday, call [phone].  After hours and on weekends, please call the GI answering service at [phone] who will take a message and have  the physician on call contact you.   Following lower endoscopy (colonoscopy or flexible sigmoidoscopy):  Excessive amounts of blood in the stool  Significant tenderness or worsening of abdominal pains  Swelling of the abdomen that is new, acute  Fever of 100F or higher  Following upper endoscopy (EGD)  Vomiting of blood or coffee ground material  New chest pain or pain under the shoulder blades  Painful or persistently difficult swallowing  New shortness of breath  Fever of 100F or higher  Black, tarry-looking stools  FOLLOW UP: If any biopsies were taken you will be contacted by phone or by letter within the next 1-3 weeks.  Call your gastroenterologist if you have not heard about the biopsies in 3 weeks.  Our staff will call the home number listed on your records the next business day following your procedure to check on you and address any questions or concerns that you may have at that time regarding the information given to you following your procedure. This is a courtesy call and so if there is no answer at the home number and we have not heard from you through the emergency physician on call, we will assume that you have returned to your regular daily activities without incident.  SIGNATURES/CONFIDENTIALITY: You and/or your care partner have signed paperwork which will be entered into your electronic medical record.  These signatures attest to the fact that that the information above on your After Visit Summary has been  reviewed and is understood.  Full responsibility of the confidentiality of this discharge information lies with you and/or your care-partner.

## 2012-02-06 NOTE — Progress Notes (Signed)
PROPOFOL PER S CAMP CRNA. SEE SCANNED INTRA PROCEDURE REPORT. EWM 

## 2012-02-06 NOTE — Op Note (Signed)
[facility] [address] [place], [place]  [address]  ENDOSCOPY PROCEDURE REPORT  PATIENT:  Andrew Sharp, Andrew Sharp  MR#:  [id number] BIRTHDATE:  08-11-1971, 41 yrs. old  GENDER:  male  ENDOSCOPIST:  Barbette Hair. Arlyce Dice, MD Referred by:  Tera Mater Clent Ridges, M.D.  PROCEDURE DATE:  02/06/2012 PROCEDURE:  EGD, diagnostic 43235, Maloney Dilation of Esophagus ASA CLASS:  Class II INDICATIONS:  dysphagia, reflux symptoms despite therapy  MEDICATIONS:   MAC sedation, administered by CRNA propofol 200mg IV, glycopyrrolate (Robinal) 0.2 mg IV, 0.6cc simethancone 0.6 cc PO TOPICAL ANESTHETIC:  DESCRIPTION OF PROCEDURE:   After the risks and benefits of the procedure were explained, informed consent was obtained.  The LB [id number] [id number] endoscope was introduced through the mouth and advanced to the third portion of the duodenum.  The instrument was slowly withdrawn as the mucosa was fully examined. <<PROCEDUREIMAGES>>  An esophageal ring was found at the gastroesophageal junction (see image3 and image5). Dilation with maloney dilator 19mm Mild resistance; minimal heme  Otherwise the examination was normal (see image2 and image4).    Retroflexed views revealed no abnormalities.    The scope was then withdrawn from the patient and the procedure completed.  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 Arlyce Dice, MD  CC:  n. eSIGNED:   Barbette Hair. Daleyssa Loiselle at 02/06/2012 03:16 PM  Thornell Sartorius, [phone]

## 2012-02-07 ENCOUNTER — Telehealth: Payer: Self-pay | Admitting: *Deleted

## 2012-02-07 NOTE — Telephone Encounter (Signed)
  Follow up Call-  Call back number 02/06/2012  Post procedure Call Back phone  # [phone]  Permission to leave phone message Yes     Patient questions:  Do you have a fever, pain , or abdominal swelling? no Pain Score  0 *  Have you tolerated food without any problems? yes  Have you been able to return to your normal activities? yes  Do you have any questions about your discharge instructions: Diet   no Medications  no Follow up visit  no  Do you have questions or concerns about your Care? no  Actions: * If pain score is 4 or above: No action needed, pain <4.

## 2012-03-19 ENCOUNTER — Telehealth: Payer: Self-pay | Admitting: Gastroenterology

## 2012-03-19 ENCOUNTER — Ambulatory Visit: Payer: 59 | Admitting: Gastroenterology

## 2012-03-19 NOTE — Telephone Encounter (Signed)
No charge. 

## 2012-04-24 ENCOUNTER — Other Ambulatory Visit: Payer: Self-pay | Admitting: Gastroenterology

## 2012-04-29 ENCOUNTER — Ambulatory Visit (INDEPENDENT_AMBULATORY_CARE_PROVIDER_SITE_OTHER): Payer: 59 | Admitting: Gastroenterology

## 2012-04-29 ENCOUNTER — Encounter: Payer: Self-pay | Admitting: Gastroenterology

## 2012-04-29 VITALS — BP 114/78 | HR 84 | Ht 69.0 in | Wt 245.1 lb

## 2012-04-29 DIAGNOSIS — K219 Gastro-esophageal reflux disease without esophagitis: Secondary | ICD-10-CM

## 2012-04-29 DIAGNOSIS — K222 Esophageal obstruction: Secondary | ICD-10-CM | POA: Insufficient documentation

## 2012-04-29 NOTE — Assessment & Plan Note (Signed)
Good control with twice a day dexilant. Patient was encouraged to try decreasing to daily or, perhaps, every other day

## 2012-04-29 NOTE — Progress Notes (Signed)
History of Present Illness:  [name] is here following upper endoscopy with dilatation of a distal esophageal stricture. Dysphagia has subsided. He has occasional breakthrough pyrosis on twice a day dexilant    Review of Systems: Pertinent positive and negative review of systems were noted in the above HPI section. All other review of systems were otherwise negative.    Current Medications, Allergies, Past Medical History, Past Surgical History, Family History and Social History were reviewed in [employer] electronic medical record  Vital signs were reviewed in today's medical record. Physical Exam: General: Well developed , well nourished, no acute distress

## 2012-04-29 NOTE — Assessment & Plan Note (Signed)
Repeat dilatation as needed 

## 2012-04-29 NOTE — Patient Instructions (Addendum)

## 2012-06-13 ENCOUNTER — Ambulatory Visit (INDEPENDENT_AMBULATORY_CARE_PROVIDER_SITE_OTHER): Payer: 59 | Admitting: Family Medicine

## 2012-06-13 ENCOUNTER — Encounter: Payer: Self-pay | Admitting: Family Medicine

## 2012-06-13 VITALS — BP 134/96 | HR 78 | Temp 99.0°F | Wt 244.0 lb

## 2012-06-13 DIAGNOSIS — R42 Dizziness and giddiness: Secondary | ICD-10-CM

## 2012-06-13 DIAGNOSIS — R51 Headache: Secondary | ICD-10-CM

## 2012-06-13 DIAGNOSIS — I10 Essential (primary) hypertension: Secondary | ICD-10-CM

## 2012-06-13 LAB — CBC WITH DIFFERENTIAL/PLATELET
Basophils Absolute: 0 10*3/uL (ref 0.0–0.1)
Basophils Relative: 0.3 % (ref 0.0–3.0)
Eosinophils Absolute: 0.2 10*3/uL (ref 0.0–0.7)
Eosinophils Relative: 3.1 % (ref 0.0–5.0)
HCT: 44.7 % (ref 39.0–52.0)
Hemoglobin: 14.9 g/dL (ref 13.0–17.0)
Lymphocytes Relative: 32.7 % (ref 12.0–46.0)
Lymphs Abs: 2.5 10*3/uL (ref 0.7–4.0)
MCHC: 33.4 g/dL (ref 30.0–36.0)
MCV: 91.9 fl (ref 78.0–100.0)
Monocytes Absolute: 1.1 10*3/uL — ABNORMAL HIGH (ref 0.1–1.0)
Monocytes Relative: 14.5 % — ABNORMAL HIGH (ref 3.0–12.0)
Neutro Abs: 3.8 10*3/uL (ref 1.4–7.7)
Neutrophils Relative %: 49.4 % (ref 43.0–77.0)
Platelets: 203 10*3/uL (ref 150.0–400.0)
RBC: 4.87 Mil/uL (ref 4.22–5.81)
RDW: 13.1 % (ref 11.5–14.6)
WBC: 7.8 10*3/uL (ref 4.5–10.5)

## 2012-06-13 LAB — BASIC METABOLIC PANEL
BUN: 16 mg/dL (ref 6–23)
CO2: 27 mEq/L (ref 19–32)
Calcium: 9.1 mg/dL (ref 8.4–10.5)
Chloride: 101 mEq/L (ref 96–112)
Creatinine, Ser: 1.1 mg/dL (ref 0.4–1.5)
GFR: 92.72 mL/min (ref >60.00)
Glucose, Bld: 90 mg/dL (ref 70–99)
Potassium: 3.4 mEq/L — ABNORMAL LOW (ref 3.5–5.1)
Sodium: 135 mEq/L (ref 135–145)

## 2012-06-13 LAB — POCT URINALYSIS DIPSTICK
Bilirubin, UA: NEGATIVE
Blood, UA: NEGATIVE
Glucose, UA: NEGATIVE
Ketones, UA: NEGATIVE
Leukocytes, UA: NEGATIVE
Nitrite, UA: NEGATIVE
Protein, UA: NEGATIVE
Spec Grav, UA: 1.02
Urobilinogen, UA: 1
pH, UA: 6

## 2012-06-13 LAB — HEPATIC FUNCTION PANEL
ALT: 27 U/L (ref 0–53)
AST: 30 U/L (ref 0–37)
Albumin: 4.3 g/dL (ref 3.5–5.2)
Alkaline Phosphatase: 53 U/L (ref 39–117)
Bilirubin, Direct: 0 mg/dL (ref 0.0–0.3)
Total Bilirubin: 0.7 mg/dL (ref 0.3–1.2)
Total Protein: 7.7 g/dL (ref 6.0–8.3)

## 2012-06-13 LAB — TSH: TSH: 1.71 u[IU]/mL (ref 0.35–5.50)

## 2012-06-13 LAB — LIPID PANEL
Cholesterol: 181 mg/dL (ref 0–200)
HDL: 32.3 mg/dL — ABNORMAL LOW (ref >39.00)
Total CHOL/HDL Ratio: 6
Triglycerides: 207 mg/dL — ABNORMAL HIGH (ref 0.0–149.0)
VLDL: 41.4 mg/dL — ABNORMAL HIGH (ref 0.0–40.0)

## 2012-06-13 LAB — LDL CHOLESTEROL, DIRECT: Direct LDL: 97.9 mg/dL

## 2012-06-13 NOTE — Progress Notes (Signed)
  Subjective:    Patient ID: Andrew Sharp, male    DOB: April 11, 1971, 41 y.o.   MRN: [id number]  HPI Here for 2 months of intermittent mild HAs in the back of the head, brief spells of lightheadedness, and occasional spells of short term memory losses. No change in vision, no chest pain or SOB, no nausea or vertigo. His BP in the GI office last month was normal.    Review of Systems  Constitutional: Negative.   Eyes: Negative.   Respiratory: Negative.   Cardiovascular: Negative.   Neurological: Positive for light-headedness and headaches. Negative for dizziness, tremors, seizures, syncope, facial asymmetry, speech difficulty, weakness and numbness.       Objective:   Physical Exam  Constitutional: He is oriented to person, place, and time. He appears well-developed and well-nourished.  Neck: Neck supple. No thyromegaly present.  Cardiovascular: Normal rate, regular rhythm, normal heart sounds and intact distal pulses.   Pulmonary/Chest: Effort normal and breath sounds normal.  Lymphadenopathy:    He has no cervical adenopathy.  Neurological: He is alert and oriented to person, place, and time. He has normal reflexes. No cranial nerve deficit. He exhibits normal muscle tone. Coordination normal.          Assessment & Plan:  HAs and lightheadedness of uncertain etiology. Certainly spikes in BP could account for these episodes. I asked him to check his BP 3-4 times a day every day for the next week and to call me back with the results. We will get some fasting labs today.

## 2012-06-19 ENCOUNTER — Encounter: Payer: Self-pay | Admitting: Family Medicine

## 2012-06-19 MED ORDER — POTASSIUM CHLORIDE ER 10 MEQ PO TBCR
10.0000 meq | EXTENDED_RELEASE_TABLET | Freq: Every day | ORAL | Status: DC
Start: 2012-06-19 — End: 2013-04-17

## 2012-06-19 NOTE — Addendum Note (Signed)
Addended by: Aniceto Boss A on: 06/19/2012 08:23 AM   Modules accepted: Orders

## 2012-06-19 NOTE — Progress Notes (Signed)
Quick Note:  I tried to reach pt again by phone, no answer. I put a copy of lab results in mail and I sent script e-scribe. ______

## 2012-07-08 ENCOUNTER — Other Ambulatory Visit: Payer: Self-pay | Admitting: Gastroenterology

## 2012-07-09 ENCOUNTER — Other Ambulatory Visit: Payer: Self-pay | Admitting: Gastroenterology

## 2012-07-22 ENCOUNTER — Ambulatory Visit (INDEPENDENT_AMBULATORY_CARE_PROVIDER_SITE_OTHER): Payer: 59 | Admitting: Family Medicine

## 2012-07-22 ENCOUNTER — Encounter: Payer: Self-pay | Admitting: Family Medicine

## 2012-07-22 ENCOUNTER — Telehealth: Payer: Self-pay | Admitting: Family Medicine

## 2012-07-22 VITALS — BP 126/88 | HR 88 | Temp 99.0°F | Wt 239.0 lb

## 2012-07-22 DIAGNOSIS — E876 Hypokalemia: Secondary | ICD-10-CM

## 2012-07-22 DIAGNOSIS — R209 Unspecified disturbances of skin sensation: Secondary | ICD-10-CM

## 2012-07-22 DIAGNOSIS — I10 Essential (primary) hypertension: Secondary | ICD-10-CM

## 2012-07-22 DIAGNOSIS — R202 Paresthesia of skin: Secondary | ICD-10-CM

## 2012-07-22 NOTE — Progress Notes (Signed)
  Subjective:    Patient ID: Andrew Sharp, male    DOB: 15-Sep-1971, 41 y.o.   MRN: [id number]  HPI Here for one week of tingling and itching in the hands and feet. He was here several weeks ago for lightheadedness and HAs, and labs revealed a mild hypokalemia. We started him on potassium supplementation, and he has been taking this for about a week. Now the HAs and lightheadedness have completely resolved. His BP at home is steady in the 130s over 80s.    Review of Systems  Constitutional: Negative.   Respiratory: Negative.   Cardiovascular: Negative.   Neurological: Positive for numbness. Negative for dizziness, tremors, seizures, syncope, speech difficulty, weakness, light-headedness and headaches.       Objective:   Physical Exam  Constitutional: He is oriented to person, place, and time. He appears well-developed and well-nourished.  Neck: No thyromegaly present.  Cardiovascular: Normal rate, regular rhythm, normal heart sounds and intact distal pulses.   Pulmonary/Chest: Effort normal and breath sounds normal.  Lymphadenopathy:    He has no cervical adenopathy.  Neurological: He is alert and oriented to person, place, and time. He has normal reflexes. No cranial nerve deficit. He exhibits normal muscle tone. Coordination normal.          Assessment & Plan:  He seems to be feeling better since we got him on a potassium supplement. I think the paresthesias may improve once he has been taking it a little longer. Check levels for vitamins B12 and D today.

## 2012-07-22 NOTE — Telephone Encounter (Signed)
Caller: [place]/Patient; Patient Name: Andrew Sharp; PCP: Gershon Crane [facility]); Best Callback Phone Number: [phone].  Call regarding itching Hands and Feet, onset 9-29 with numbness in Feet, onset 9-30.  All emergent symptoms ruled out per Foot non-injury protocol see in 24 hours.  Appointment scheduled at 1545 on 9-30 with Dr Clent Ridges.  Patient verbalized understanding.

## 2012-07-23 LAB — VITAMIN B12: Vitamin B-12: 769 pg/mL (ref 211–911)

## 2012-07-23 LAB — VITAMIN D 25 HYDROXY (VIT D DEFICIENCY, FRACTURES): Vit D, 25-Hydroxy: 26 ng/mL — ABNORMAL LOW (ref 30–89)

## 2012-07-24 ENCOUNTER — Telehealth: Payer: Self-pay | Admitting: Family Medicine

## 2012-07-24 ENCOUNTER — Encounter: Payer: Self-pay | Admitting: Family Medicine

## 2012-07-24 MED ORDER — ERGOCALCIFEROL 1.25 MG (50000 UT) PO CAPS: 50000.0000 [IU] | ORAL_CAPSULE | ORAL | Status: DC

## 2012-07-24 NOTE — Addendum Note (Signed)
Addended by: Aniceto Boss A on: 07/24/2012 10:51 AM   Modules accepted: Orders

## 2012-07-24 NOTE — Telephone Encounter (Signed)
Caller: [place]/Patient; Patient Name: Andrew Sharp; PCP: Gershon Crane [facility]); Best Callback Phone Number: [phone]. Patient returning a call from the office. Reviewed Epic, and there were lab results and orders to give. Reported per Dr. Claris Che instructions that the Vitamin B level is quite low, B12 is normal. Called in per orders Vitamin D 50,000 unit capsules by mouth, take one capsule weekly for 12 weeks, then have labs repeated. Pharmacy: [facility] on [address] Phone: [phone]. Spoke with Jarold Motto, pharmacist to give prescription. Patient verbalized understanding.

## 2012-07-24 NOTE — Telephone Encounter (Signed)
FYI

## 2012-07-24 NOTE — Progress Notes (Signed)
Quick Note:  I have tried to reach pt by phone, no answer. I put a copy of results in mail and I sent script e-scribe. ______

## 2012-07-30 ENCOUNTER — Telehealth: Payer: Self-pay | Admitting: Family Medicine

## 2012-07-30 MED ORDER — TRIAMCINOLONE ACETONIDE 0.1 % EX CREA: TOPICAL_CREAM | Freq: Two times a day (BID) | CUTANEOUS | Status: DC | PRN

## 2012-07-30 NOTE — Telephone Encounter (Signed)
I sent script e-scribe and spoke with pt. 

## 2012-07-30 NOTE — Telephone Encounter (Signed)
Pt called to get a med called in for itching hand and feet. Pt was in to see Dr Clent Ridges about this last week. Pt says that blister are forming on hands and feet. [facility] on [address]

## 2012-07-30 NOTE — Telephone Encounter (Signed)
Call in Triamcinolone 0.1% cream to apply bid prn, 60 grams with 2 rf

## 2012-10-28 ENCOUNTER — Other Ambulatory Visit: Payer: Self-pay | Admitting: Family Medicine

## 2012-11-04 ENCOUNTER — Other Ambulatory Visit: Payer: Self-pay | Admitting: Family Medicine

## 2012-12-02 IMAGING — CR DG KNEE COMPLETE 4+V*L*
4 series · 4 of 4 positions shown · non-contrast
Comparison: None.

CLINICAL DATA: Knee pain.  Basketball injury.  No prior injury.

LEFT KNEE - COMPLETE 4+ VIEW

[t knee ap left]
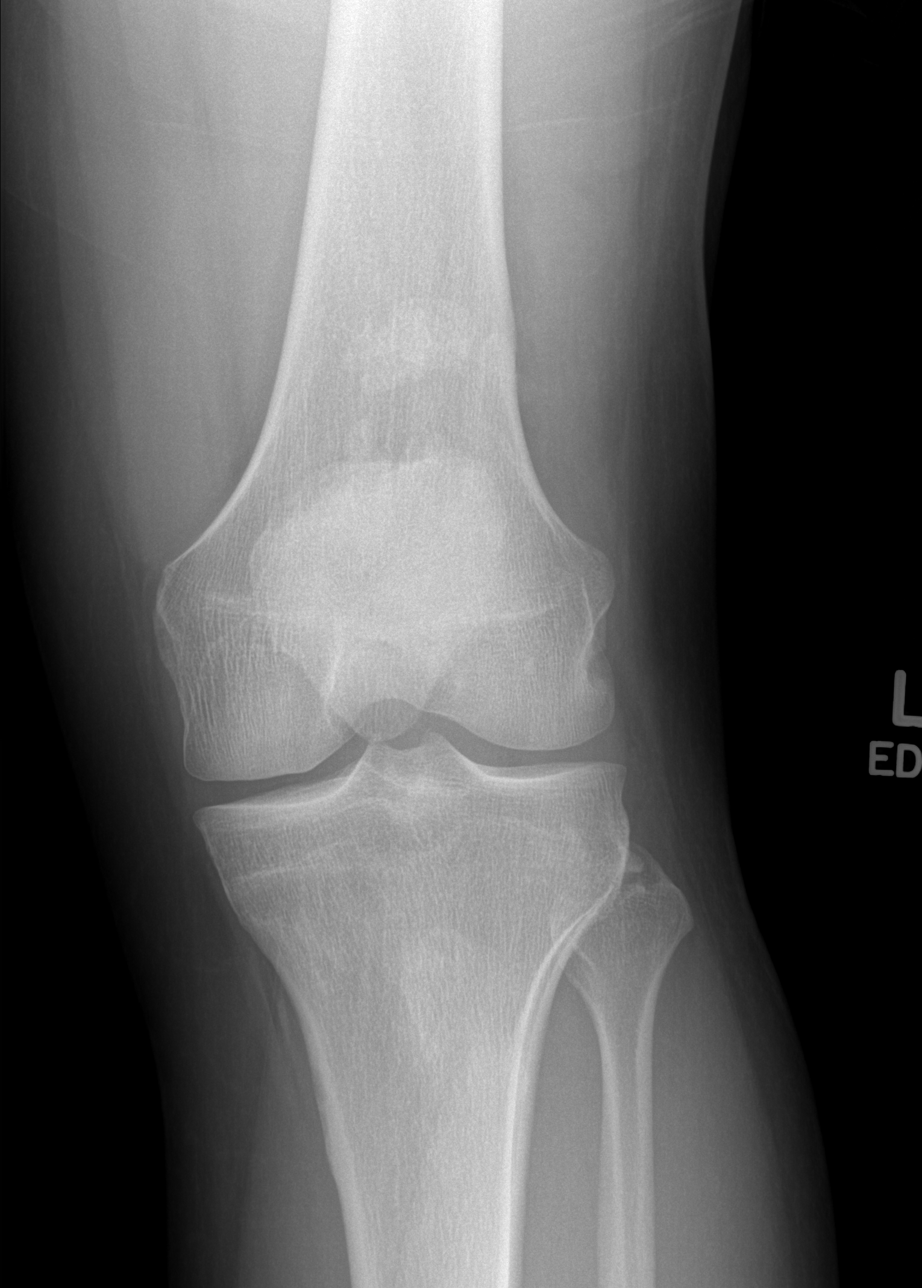

[t knee oblique left (1 of 2)]
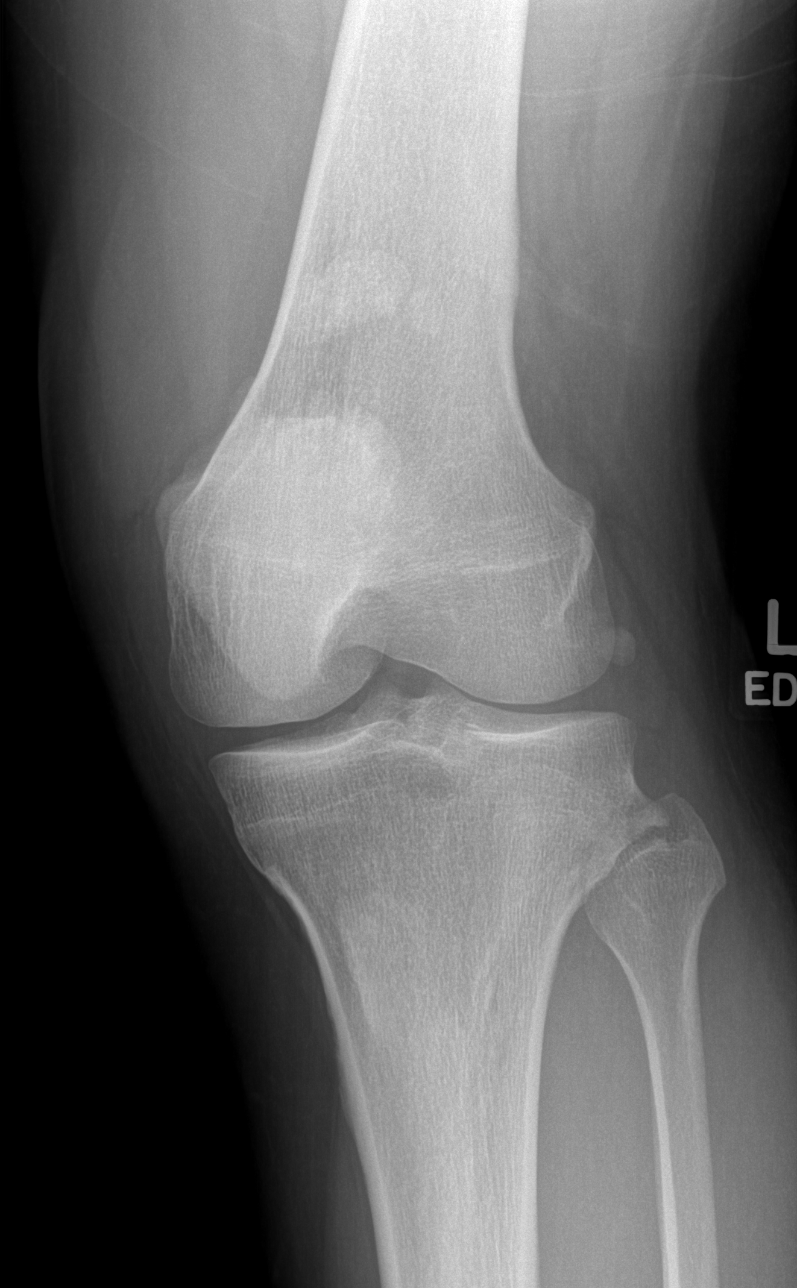

[t knee oblique left (2 of 2)]
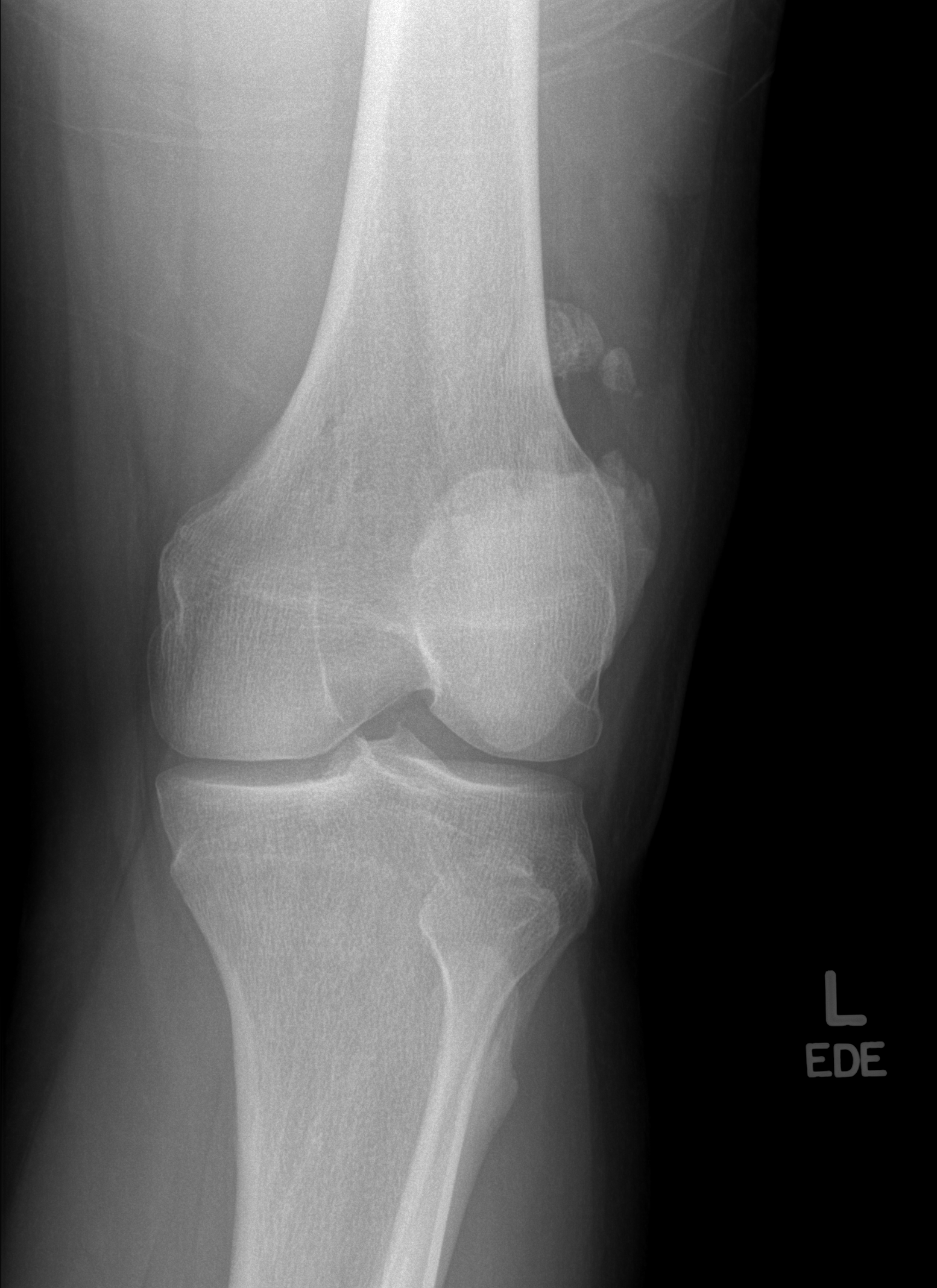

[t knee lat left]
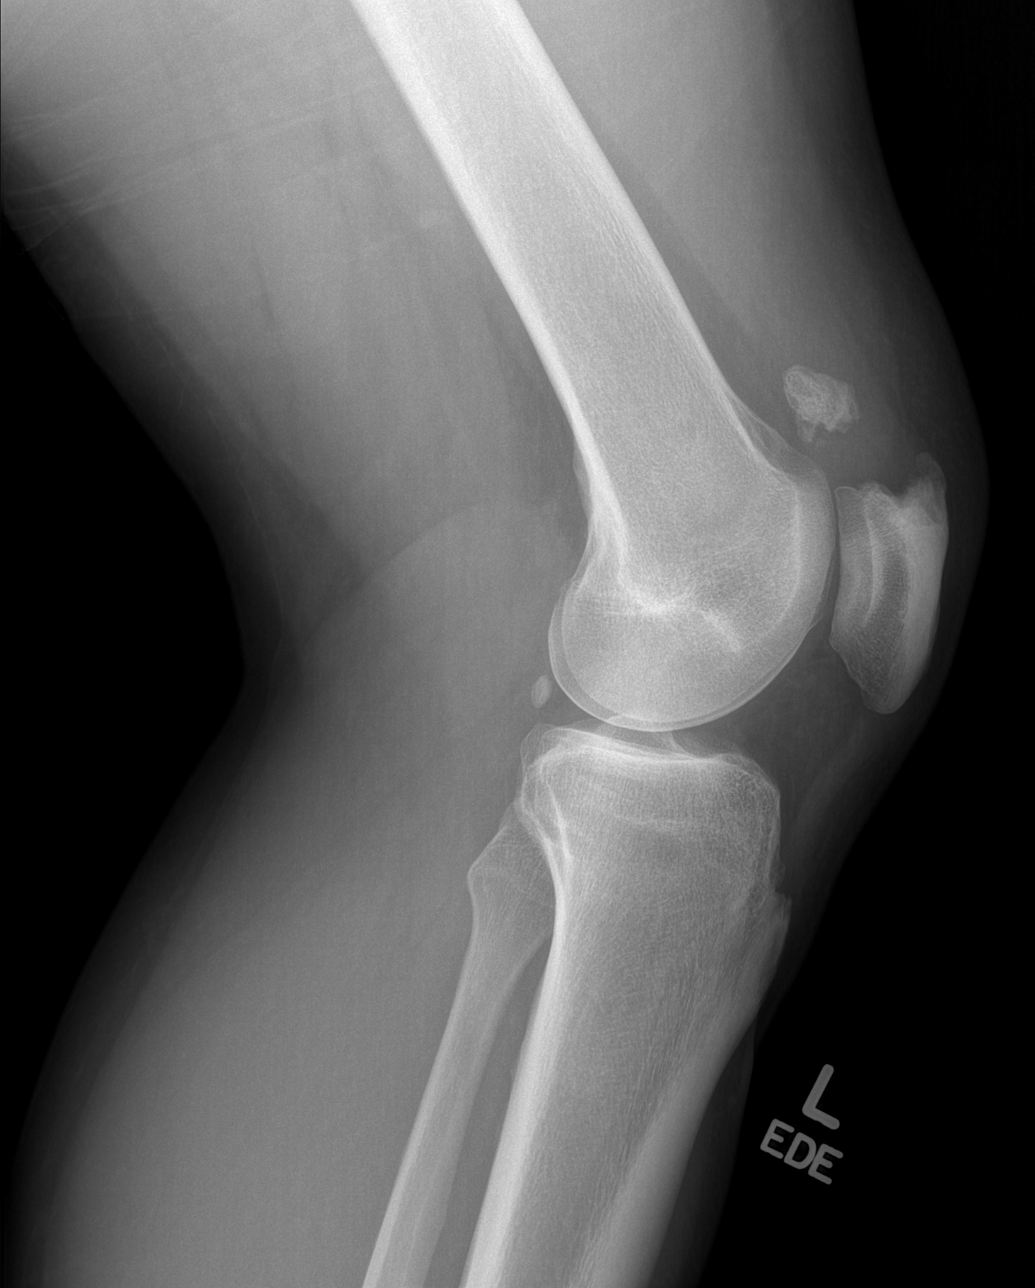

[4 of 4 positions shown; findings below may reference images not displayed]

FINDINGS: There is a small osteochondroma projecting laterally off
the lateral tibia adjacent to the proximal tibiofibular joint.
Irregular ossification is present measuring 19 mm x 14 mm overlying
the suprapatellar knee. Irregularity of the superior patellar pole
is present with enthesopathic changes.  No effusion is identified.
There is no fracture.
IMPRESSION: 1.  No acute osseous abnormality.
2.  Ossification in the region of the distal quadriceps tendon and
suprapatellar pouch.  Differential considerations include previous
quadriceps tendon injury with calcification, myositis ossificans,
old superior patellar avulsion, with loose body considered unlikely
based on the configuration.  Old patellar avulsion is favored.
3.  Small osteochondroma extending off the lateral tibia at the
proximal tibiofibular joint.

## 2013-02-17 ENCOUNTER — Telehealth: Payer: Self-pay | Admitting: Family Medicine

## 2013-02-17 NOTE — Telephone Encounter (Signed)
Patient Information:  Caller Name: Andrew Sharp  Phone: [phone]  Patient: Andrew Sharp  Gender: Male  DOB: 10-08-1971  Age: 42 Years  PCP: Andrew Sharp [facility])  Office Follow Up:  Does the office need to follow up with this patient?: No  Instructions For The Office: N/A  RN Note:  Advised pt to elevate legs when possible and avoid any prolong sitting or standing.  Symptoms  Reason For Call & Symptoms: Pt calling today 02/17/13 regarding having swelling leg during the evening hours.  No injuries  Reviewed Health History In EMR: Yes  Reviewed Medications In EMR: Yes  Reviewed Allergies In EMR: Yes  Reviewed Surgeries / Procedures: Yes  Date of Onset of Symptoms: 02/15/2013  Guideline(s) Used:  Leg Swelling and Edema  Disposition Per Guideline:   See Today in Office  Reason For Disposition Reached:   Moderate swelling of both ankles (e.g., swelling extends up to the knees) AND new onset or worsening  Advice Given:  Call Back If:  Swelling becomes worse  Swelling becomes red or painful to the touch  Calf pain occurs and becomes constant  You become worse.  Patient Refused Recommendation:  Patient Refused Appt, Patient Requests Appt At Later Date  Pt wants appt for Thursday.  Triage advised caller he should really be seen today.  Pt declined appt because he has to work and will call back later to make appt for Thursday.

## 2013-02-19 ENCOUNTER — Other Ambulatory Visit: Payer: Self-pay | Admitting: Family Medicine

## 2013-04-17 ENCOUNTER — Encounter: Payer: Self-pay | Admitting: Family Medicine

## 2013-04-17 ENCOUNTER — Ambulatory Visit (INDEPENDENT_AMBULATORY_CARE_PROVIDER_SITE_OTHER): Payer: 59 | Admitting: Family Medicine

## 2013-04-17 VITALS — BP 146/84 | HR 76 | Temp 98.4°F | Wt 245.0 lb

## 2013-04-17 DIAGNOSIS — I10 Essential (primary) hypertension: Secondary | ICD-10-CM

## 2013-04-17 MED ORDER — AMLODIPINE BESYLATE 10 MG PO TABS: 10.0000 mg | ORAL_TABLET | Freq: Every day | ORAL | Status: DC

## 2013-04-17 MED ORDER — LOSARTAN POTASSIUM-HCTZ 100-25 MG PO TABS: 1.0000 | ORAL_TABLET | Freq: Every day | ORAL | Status: DC

## 2013-04-17 MED ORDER — POTASSIUM CHLORIDE ER 10 MEQ PO TBCR: 10.0000 meq | EXTENDED_RELEASE_TABLET | Freq: Every day | ORAL | Status: DC

## 2013-04-17 NOTE — Progress Notes (Signed)
  Subjective:    Patient ID: Andrew Sharp, male    DOB: 1971-05-31, 42 y.o.   MRN: [id number]  HPI Here for high BP. At home for the past few weeks he has been getting readings of 150s over 90s, and he has been getting some mild HAs. No chest pain or SOB.    Review of Systems  Constitutional: Negative.   Respiratory: Negative.   Cardiovascular: Negative.   Neurological: Positive for headaches. Negative for dizziness, tremors, seizures, syncope, facial asymmetry, speech difficulty, weakness, light-headedness and numbness.       Objective:   Physical Exam  Constitutional: He appears well-developed and well-nourished.  Cardiovascular: Normal rate, regular rhythm, normal heart sounds and intact distal pulses.   Pulmonary/Chest: Effort normal and breath sounds normal.  Musculoskeletal: He exhibits no edema.          Assessment & Plan:  We will add Losartan to his regimen. Recheck one month

## 2013-05-15 ENCOUNTER — Ambulatory Visit: Payer: 59 | Admitting: Family Medicine

## 2013-08-06 ENCOUNTER — Other Ambulatory Visit: Payer: Self-pay | Admitting: *Deleted

## 2013-08-06 MED ORDER — DEXLANSOPRAZOLE 60 MG PO CPDR: 60.0000 mg | DELAYED_RELEASE_CAPSULE | Freq: Two times a day (BID) | ORAL | Status: DC

## 2013-12-24 ENCOUNTER — Ambulatory Visit (INDEPENDENT_AMBULATORY_CARE_PROVIDER_SITE_OTHER): Payer: 59 | Admitting: Family Medicine

## 2013-12-24 ENCOUNTER — Encounter: Payer: Self-pay | Admitting: Family Medicine

## 2013-12-24 VITALS — BP 130/90 | HR 92 | Temp 99.1°F | Ht 69.0 in | Wt 244.0 lb

## 2013-12-24 DIAGNOSIS — M545 Low back pain, unspecified: Secondary | ICD-10-CM

## 2013-12-24 DIAGNOSIS — M79605 Pain in left leg: Secondary | ICD-10-CM

## 2013-12-24 LAB — POCT URINALYSIS DIPSTICK
Bilirubin, UA: NEGATIVE
Blood, UA: NEGATIVE
Glucose, UA: NEGATIVE
Ketones, UA: NEGATIVE
Leukocytes, UA: NEGATIVE
Nitrite, UA: NEGATIVE
Spec Grav, UA: 1.025
Urobilinogen, UA: 0.2
pH, UA: 7

## 2013-12-24 MED ORDER — DICLOFENAC SODIUM 75 MG PO TBEC: 75.0000 mg | DELAYED_RELEASE_TABLET | Freq: Two times a day (BID) | ORAL | Status: DC

## 2013-12-24 NOTE — Progress Notes (Signed)
   Subjective:    Patient ID: Andrew Sharp, male    DOB: 01-10-1971, 43 y.o.   MRN: [id number]  HPI Here for 2 months of intermittent sharp left lower back pains that sometimes radiate to the left buttock and sometimes to the left groin region. No numbness or weakness in the legs. No bowel or urinary sx. He had been seeing a chiropractor but this has not helped. Using heat and Advil.    Review of Systems  Constitutional: Negative.   Genitourinary: Negative.   Musculoskeletal: Positive for back pain.       Objective:   Physical Exam  Constitutional: He appears well-developed and well-nourished.  Abdominal: Soft. Bowel sounds are normal. He exhibits no distension and no mass. There is no tenderness. There is no rebound and no guarding.  Musculoskeletal:  Tender in the left lower back, full ROM          Assessment & Plan:  Try Diclofenac bid. Set up a lumbar MRI.

## 2013-12-31 NOTE — Addendum Note (Signed)
Addended by: Alysia Penna A on: 12/31/2013 05:58 PM   Modules accepted: Orders

## 2014-03-24 ENCOUNTER — Other Ambulatory Visit: Payer: Self-pay | Admitting: *Deleted

## 2014-03-24 MED ORDER — DEXLANSOPRAZOLE 60 MG PO CPDR: 60.0000 mg | DELAYED_RELEASE_CAPSULE | Freq: Two times a day (BID) | ORAL | Status: DC

## 2014-04-27 ENCOUNTER — Other Ambulatory Visit: Payer: Self-pay | Admitting: Family Medicine

## 2014-05-22 ENCOUNTER — Ambulatory Visit: Payer: 59 | Admitting: Gastroenterology

## 2014-07-12 ENCOUNTER — Other Ambulatory Visit: Payer: Self-pay | Admitting: Family Medicine

## 2014-07-14 ENCOUNTER — Ambulatory Visit: Payer: 59 | Admitting: Gastroenterology

## 2014-08-20 ENCOUNTER — Other Ambulatory Visit: Payer: Self-pay | Admitting: Family Medicine

## 2014-09-09 ENCOUNTER — Ambulatory Visit: Payer: 59 | Admitting: Gastroenterology

## 2014-10-26 ENCOUNTER — Other Ambulatory Visit: Payer: Self-pay | Admitting: *Deleted

## 2014-10-26 MED ORDER — DEXLANSOPRAZOLE 60 MG PO CPDR: 60.0000 mg | DELAYED_RELEASE_CAPSULE | Freq: Two times a day (BID) | ORAL | Status: DC

## 2014-11-29 ENCOUNTER — Other Ambulatory Visit: Payer: Self-pay | Admitting: Family Medicine

## 2015-02-04 ENCOUNTER — Telehealth: Payer: Self-pay | Admitting: Gastroenterology

## 2015-02-04 MED ORDER — DEXLANSOPRAZOLE 60 MG PO CPDR: 60.0000 mg | DELAYED_RELEASE_CAPSULE | Freq: Two times a day (BID) | ORAL | Status: DC

## 2015-02-04 NOTE — Telephone Encounter (Signed)
Med sent. Office appt made

## 2015-03-01 ENCOUNTER — Ambulatory Visit: Payer: Self-pay | Admitting: Gastroenterology

## 2015-03-07 ENCOUNTER — Other Ambulatory Visit: Payer: Self-pay | Admitting: Family Medicine

## 2015-03-25 ENCOUNTER — Ambulatory Visit (INDEPENDENT_AMBULATORY_CARE_PROVIDER_SITE_OTHER): Payer: 59 | Admitting: Nurse Practitioner

## 2015-03-25 ENCOUNTER — Encounter: Payer: Self-pay | Admitting: Nurse Practitioner

## 2015-03-25 VITALS — BP 130/80 | HR 80 | Ht 69.0 in | Wt 246.2 lb

## 2015-03-25 DIAGNOSIS — IMO0001 Reserved for inherently not codable concepts without codable children: Secondary | ICD-10-CM

## 2015-03-25 DIAGNOSIS — Z8719 Personal history of other diseases of the digestive system: Secondary | ICD-10-CM | POA: Diagnosis not present

## 2015-03-25 DIAGNOSIS — K219 Gastro-esophageal reflux disease without esophagitis: Secondary | ICD-10-CM | POA: Diagnosis not present

## 2015-03-25 DIAGNOSIS — R1110 Vomiting, unspecified: Secondary | ICD-10-CM

## 2015-03-25 DIAGNOSIS — R112 Nausea with vomiting, unspecified: Secondary | ICD-10-CM

## 2015-03-25 MED ORDER — SUCRALFATE 1 G PO TABS: 1.0000 g | ORAL_TABLET | Freq: Three times a day (TID) | ORAL | Status: DC | PRN

## 2015-03-25 NOTE — Patient Instructions (Signed)
We have sent the following medications to your pharmacy for you to pick up at your convenience:Carafate.  You have been scheduled for an endoscopy. Please follow written instructions given to you at your visit today. If you use inhalers (even only as needed), please bring them with you on the day of your procedure. Your physician has requested that you go to www.startemmi.com and enter the access code given to you at your visit today. This web site gives a general overview about your procedure. However, you should still follow specific instructions given to you by our office regarding your preparation for the procedure.

## 2015-03-28 ENCOUNTER — Encounter: Payer: Self-pay | Admitting: Nurse Practitioner

## 2015-03-28 DIAGNOSIS — Z8719 Personal history of other diseases of the digestive system: Secondary | ICD-10-CM | POA: Insufficient documentation

## 2015-03-28 DIAGNOSIS — R1110 Vomiting, unspecified: Secondary | ICD-10-CM

## 2015-03-28 DIAGNOSIS — IMO0001 Reserved for inherently not codable concepts without codable children: Secondary | ICD-10-CM | POA: Insufficient documentation

## 2015-03-28 NOTE — Progress Notes (Signed)
     History of Present Illness:   Patient is a 44 year old male known to Dr. Deatra Ina. 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 Patient did well until approximately 3 months ago when he began having "gagging" episodes AFTER meals. Food doesn't get stuck in esophagus during meals. No significant pyrosis. No nausea, but spits up undigested food during these post-prandial episodes of "gagging". Episodes reminiscent of when he saw [place] in 2013.    Current Medications, Allergies, Past Medical History, Past Surgical History, Family History and Social History were reviewed in [employer] record.   Physical Exam: General: Pleasant, well developed , male in no acute distress Head: Normocephalic and atraumatic Eyes:  sclerae anicteric, conjunctiva pink  Ears: Normal auditory acuity Lungs: Clear throughout to auscultation Heart: Regular rate and rhythm Abdomen: Soft, non distended, non-tender. No masses, no hepatomegaly. Normal bowel sounds Musculoskeletal: Symmetrical with no gross deformities  Extremities: No edema  Neurological: Alert oriented x 4, grossly nonfocal Psychological:  Alert and cooperative. Normal mood and affect  Assessment and Recommendations:   44 year old male with GERD / hx of esophageal ring in 2013. Now with recurrent post-prandial "gagging" associated with regurgitation of undigested food. No nausea. Rule out recurrent ring. Patient will be scheduled for EGD with possible dilation. The benefits, risks, and potential complications of EGD with possible biopsies and/or dilation were discussed with the patient and he agrees to proceed.

## 2015-03-29 NOTE — Progress Notes (Signed)
Reviewed and agree with management. Selin Eisler D. Shamicka Inga, M.D., FACG  

## 2015-04-07 ENCOUNTER — Other Ambulatory Visit: Payer: Self-pay | Admitting: Family Medicine

## 2015-04-07 NOTE — Telephone Encounter (Signed)
Can we refill these? Looks like pt is due for office visit.

## 2015-04-08 NOTE — Telephone Encounter (Signed)
Refill both for 90 days only, he needs an OV soon

## 2015-05-03 ENCOUNTER — Other Ambulatory Visit: Payer: Self-pay | Admitting: Family Medicine

## 2015-05-10 ENCOUNTER — Encounter: Payer: Self-pay | Admitting: Gastroenterology

## 2015-05-10 ENCOUNTER — Telehealth: Payer: Self-pay | Admitting: Gastroenterology

## 2015-05-10 MED ORDER — DEXLANSOPRAZOLE 60 MG PO CPDR: 60.0000 mg | DELAYED_RELEASE_CAPSULE | Freq: Two times a day (BID) | ORAL | Status: DC

## 2015-05-10 NOTE — Telephone Encounter (Signed)
Med sent to pharmacy Patient aware that med was sent

## 2015-05-26 ENCOUNTER — Ambulatory Visit (AMBULATORY_SURGERY_CENTER): Payer: 59 | Admitting: Gastroenterology

## 2015-05-26 ENCOUNTER — Encounter: Payer: Self-pay | Admitting: Gastroenterology

## 2015-05-26 VITALS — BP 132/93 | HR 77 | Temp 97.1°F | Resp 18 | Ht 69.0 in | Wt 246.0 lb

## 2015-05-26 DIAGNOSIS — K219 Gastro-esophageal reflux disease without esophagitis: Secondary | ICD-10-CM | POA: Diagnosis not present

## 2015-05-26 MED ORDER — SODIUM CHLORIDE 0.9 % IV SOLN: 500.0000 mL | INTRAVENOUS | Status: DC

## 2015-05-26 NOTE — Op Note (Signed)
[facility]  [address] [place], [address]   ENDOSCOPY PROCEDURE REPORT  PATIENT: Andrew, Sharp  MR#: [id number] BIRTHDATE: [birth year] , 74  yrs. old GENDER: male ENDOSCOPIST: Inda Castle, MD REFERRED BY:  Laurey Morale, M.D. PROCEDURE DATE:  05/26/2015 PROCEDURE:  EGD, diagnostic ASA CLASS:     Class II INDICATIONS:  dysphagia (choking with swallowing), h/o stricture. MEDICATIONS: Monitored anesthesia care, Propofol 200 mg IV, and Lidocaine 200 mg IV TOPICAL ANESTHETIC:  DESCRIPTION OF PROCEDURE: After the risks benefits and alternatives of the procedure were thoroughly explained, informed consent was obtained.  The LB [id number] endoscope was introduced through the mouth and advanced to the second portion of the duodenum , Without limitations.  The instrument was slowly withdrawn as the mucosa was fully examined.      EXAM: The esophagus and gastroesophageal junction were completely normal in appearance.  The stomach was entered and closely examined.The antrum, angularis, and lesser curvature were well visualized, including a retroflexed view of the cardia and fundus. The stomach wall was normally distensable.  The scope passed easily through the pylorus into the duodenum.  Retroflexed views revealed no abnormalities.     The scope was then withdrawn from the patient and the procedure completed.  COMPLICATIONS: There were no immediate complications.  ENDOSCOPIC IMPRESSION: Normal appearing esophagus and GE junction, the stomach was well visualized and normal in appearance, normal appearing duodenum  RECOMMENDATIONS: My office will arrange for you to have a Barium Esophagram performed.  This is a radiology test to examine your esophagus.   REPEAT EXAM:  eSigned:  Inda Castle, MD 05/26/2015 11:20 AM    CC:

## 2015-05-26 NOTE — Patient Instructions (Signed)
YOU HAD AN ENDOSCOPIC PROCEDURE TODAY AT THE [facility]:   Refer to the procedure report that was given to you for any specific questions about what was found during the examination.  If the procedure report does not answer your questions, please call your gastroenterologist to clarify.  If you requested that your care partner not be given the details of your procedure findings, then the procedure report has been included in a sealed envelope for you to review at your convenience later.  YOU SHOULD EXPECT: Some feelings of bloating in the abdomen. Passage of more gas than usual.  Walking can help get rid of the air that was put into your GI tract during the procedure and reduce the bloating. If you had a lower endoscopy (such as a colonoscopy or flexible sigmoidoscopy) you may notice spotting of blood in your stool or on the toilet paper. If you underwent a bowel prep for your procedure, you may not have a normal bowel movement for a few days.  Please Note:  You might notice some irritation and congestion in your nose or some drainage.  This is from the oxygen used during your procedure.  There is no need for concern and it should clear up in a day or so.  SYMPTOMS TO REPORT IMMEDIATELY:    Following upper endoscopy (EGD)  Vomiting of blood or coffee ground material  New chest pain or pain under the shoulder blades  Painful or persistently difficult swallowing  New shortness of breath  Fever of 100F or higher  Black, tarry-looking stools  For urgent or emergent issues, a gastroenterologist can be reached at any hour by calling [phone].   DIET: Your first meal following the procedure should be a small meal and then it is ok to progress to your normal diet. Heavy or fried foods are harder to digest and may make you feel nauseous or bloated.  Likewise, meals heavy in dairy and vegetables can increase bloating.  Drink plenty of fluids but you should avoid alcoholic beverages  for 24 hours.  ACTIVITY:  You should plan to take it easy for the rest of today and you should NOT DRIVE or use heavy machinery until tomorrow (because of the sedation medicines used during the test).    FOLLOW UP: Our staff will call the number listed on your records the next business day following your procedure to check on you and address any questions or concerns that you may have regarding the information given to you following your procedure. If we do not reach you, we will leave a message.  However, if you are feeling well and you are not experiencing any problems, there is no need to return our call.  We will assume that you have returned to your regular daily activities without incident.  If any biopsies were taken you will be contacted by phone or by letter within the next 1-3 weeks.  Please call us at [phone] if you have not heard about the biopsies in 3 weeks.    SIGNATURES/CONFIDENTIALITY: You and/or your care partner have signed paperwork which will be entered into your electronic medical record.  These signatures attest to the fact that that the information above on your After Visit Summary has been reviewed and is understood.  Full responsibility of the confidentiality of this discharge information lies with you and/or your care-partner. 

## 2015-05-26 NOTE — Progress Notes (Signed)
Report to PACU, RN, vss, BBS= Clear.  

## 2015-05-27 ENCOUNTER — Telehealth: Payer: Self-pay

## 2015-05-27 NOTE — Telephone Encounter (Signed)
  Follow up Call-  Call back number 05/26/2015  Post procedure Call Back phone  # [phone]  Permission to leave phone message Yes     Patient questions:  Do you have a fever, pain , or abdominal swelling? No. Pain Score  0 *  Have you tolerated food without any problems? Yes.    Have you been able to return to your normal activities? Yes.    Do you have any questions about your discharge instructions: Diet   No. Medications  No. Follow up visit  No.  Do you have questions or concerns about your Care? No.  Actions: * If pain score is 4 or above: No action needed, pain <4.

## 2015-05-31 ENCOUNTER — Other Ambulatory Visit: Payer: Self-pay

## 2015-05-31 DIAGNOSIS — R1314 Dysphagia, pharyngoesophageal phase: Secondary | ICD-10-CM

## 2015-06-07 ENCOUNTER — Ambulatory Visit (HOSPITAL_COMMUNITY)
Admission: RE | Admit: 2015-06-07 | Discharge: 2015-06-07 | Disposition: A | Discharge status: Home / Self Care | Payer: 59 | Source: Ambulatory Visit | Attending: Gastroenterology | Admitting: Gastroenterology

## 2015-06-07 DIAGNOSIS — K219 Gastro-esophageal reflux disease without esophagitis: Secondary | ICD-10-CM | POA: Insufficient documentation

## 2015-06-07 DIAGNOSIS — R1110 Vomiting, unspecified: Secondary | ICD-10-CM | POA: Insufficient documentation

## 2015-06-07 DIAGNOSIS — R1314 Dysphagia, pharyngoesophageal phase: Secondary | ICD-10-CM

## 2015-06-09 ENCOUNTER — Other Ambulatory Visit: Payer: Self-pay

## 2015-06-09 ENCOUNTER — Other Ambulatory Visit: Payer: Self-pay | Admitting: Family Medicine

## 2015-06-09 DIAGNOSIS — IMO0001 Reserved for inherently not codable concepts without codable children: Secondary | ICD-10-CM

## 2015-06-09 DIAGNOSIS — R1110 Vomiting, unspecified: Principal | ICD-10-CM

## 2015-06-17 ENCOUNTER — Ambulatory Visit (HOSPITAL_COMMUNITY)
Admission: RE | Admit: 2015-06-17 | Discharge: 2015-06-17 | Disposition: A | Discharge status: Home / Self Care | Payer: 59 | Source: Ambulatory Visit | Attending: Gastroenterology | Admitting: Gastroenterology

## 2015-06-17 ENCOUNTER — Other Ambulatory Visit (HOSPITAL_COMMUNITY): Payer: Self-pay | Admitting: Gastroenterology

## 2015-06-17 DIAGNOSIS — R1314 Dysphagia, pharyngoesophageal phase: Secondary | ICD-10-CM | POA: Insufficient documentation

## 2015-06-17 DIAGNOSIS — IMO0001 Reserved for inherently not codable concepts without codable children: Secondary | ICD-10-CM

## 2015-06-17 DIAGNOSIS — R1110 Vomiting, unspecified: Secondary | ICD-10-CM

## 2015-07-01 ENCOUNTER — Encounter: Payer: Self-pay | Admitting: Gastroenterology

## 2015-07-01 ENCOUNTER — Ambulatory Visit (INDEPENDENT_AMBULATORY_CARE_PROVIDER_SITE_OTHER): Payer: 59 | Admitting: Gastroenterology

## 2015-07-01 DIAGNOSIS — K219 Gastro-esophageal reflux disease without esophagitis: Secondary | ICD-10-CM

## 2015-07-01 MED ORDER — OMEPRAZOLE-SODIUM BICARBONATE 40-1100 MG PO CAPS: ORAL_CAPSULE | ORAL | Status: DC

## 2015-07-01 NOTE — Progress Notes (Signed)
      History of Present Illness:  Andrew Sharp is returned for follow-up of gagging.  Endoscopy, Barrett's swallow in dysphagia study were all entirely unremarkable.  He had an episode of gagging and regurgitation immediately following the dysphagia study.  He is on dexilant twice a day.  He complains of occasional chest burning, especially after retiring.  Burning may also trigger these gagging episodes.    Review of Systems: Pertinent positive and negative review of systems were noted in the above HPI section. All other review of systems were otherwise negative.    Current Medications, Allergies, Past Medical History, Past Surgical History, Family History and Social History were reviewed in [facility] record  Vital signs were reviewed in today's medical record. Physical Exam: General: Well developed , well nourished, no acute distress  See Assessment and Plan under Problem List

## 2015-07-01 NOTE — Patient Instructions (Signed)
Thank You for choosing [facility] to care for you.

## 2015-07-01 NOTE — Assessment & Plan Note (Signed)
Gagging and regurgitation episodes may be triggered by mild regurgitation.  It appears that he is having an exaggerated response to this.  At the same time he is having some breakthrough pyrosis, especially at night.  Recommendations #1 switch from dexilant 2 Zegerid 40 mg daily at bedtime #2 and acids when necessary

## 2015-07-12 ENCOUNTER — Other Ambulatory Visit: Payer: Self-pay | Admitting: Family Medicine

## 2015-08-05 ENCOUNTER — Ambulatory Visit: Payer: 59 | Admitting: Family Medicine

## 2015-08-10 ENCOUNTER — Ambulatory Visit (INDEPENDENT_AMBULATORY_CARE_PROVIDER_SITE_OTHER): Payer: 59 | Admitting: Family Medicine

## 2015-08-10 ENCOUNTER — Encounter: Payer: Self-pay | Admitting: Family Medicine

## 2015-08-10 VITALS — BP 137/88 | HR 74 | Temp 98.7°F | Ht 68.5 in | Wt 247.0 lb

## 2015-08-10 DIAGNOSIS — I10 Essential (primary) hypertension: Secondary | ICD-10-CM

## 2015-08-10 LAB — CBC WITH DIFFERENTIAL/PLATELET
Basophils Absolute: 0 10*3/uL (ref 0.0–0.1)
Basophils Relative: 0.5 % (ref 0.0–3.0)
Eosinophils Absolute: 0.3 10*3/uL (ref 0.0–0.7)
Eosinophils Relative: 4.1 % (ref 0.0–5.0)
HCT: 43.1 % (ref 39.0–52.0)
Hemoglobin: 14.9 g/dL (ref 13.0–17.0)
Lymphocytes Relative: 35.3 % (ref 12.0–46.0)
Lymphs Abs: 2.9 10*3/uL (ref 0.7–4.0)
MCHC: 34.6 g/dL (ref 30.0–36.0)
MCV: 90 fl (ref 78.0–100.0)
Monocytes Absolute: 0.9 10*3/uL (ref 0.1–1.0)
Monocytes Relative: 10.5 % (ref 3.0–12.0)
Neutro Abs: 4.1 10*3/uL (ref 1.4–7.7)
Neutrophils Relative %: 49.6 % (ref 43.0–77.0)
Platelets: 213 10*3/uL (ref 150.0–400.0)
RBC: 4.79 Mil/uL (ref 4.22–5.81)
RDW: 13.4 % (ref 11.5–15.5)
WBC: 8.3 10*3/uL (ref 4.0–10.5)

## 2015-08-10 LAB — POCT URINALYSIS DIPSTICK
Bilirubin, UA: NEGATIVE
Blood, UA: NEGATIVE
Glucose, UA: NEGATIVE
Ketones, UA: NEGATIVE
Leukocytes, UA: NEGATIVE
Nitrite, UA: NEGATIVE
Spec Grav, UA: 1.02
Urobilinogen, UA: 0.2
pH, UA: 6.5

## 2015-08-10 LAB — LIPID PANEL
Cholesterol: 162 mg/dL (ref 0–200)
HDL: 29.2 mg/dL — ABNORMAL LOW (ref >39.00)
LDL Cholesterol: 94 mg/dL (ref 0–99)
NonHDL: 133.01
Total CHOL/HDL Ratio: 6
Triglycerides: 197 mg/dL — ABNORMAL HIGH (ref 0.0–149.0)
VLDL: 39.4 mg/dL (ref 0.0–40.0)

## 2015-08-10 LAB — HEPATIC FUNCTION PANEL
ALT: 28 U/L (ref 0–53)
AST: 24 U/L (ref 0–37)
Albumin: 4.1 g/dL (ref 3.5–5.2)
Alkaline Phosphatase: 68 U/L (ref 39–117)
Bilirubin, Direct: 0.1 mg/dL (ref 0.0–0.3)
Total Bilirubin: 0.5 mg/dL (ref 0.2–1.2)
Total Protein: 7.1 g/dL (ref 6.0–8.3)

## 2015-08-10 LAB — BASIC METABOLIC PANEL
BUN: 10 mg/dL (ref 6–23)
CO2: 32 mEq/L (ref 19–32)
Calcium: 9.3 mg/dL (ref 8.4–10.5)
Chloride: 103 mEq/L (ref 96–112)
Creatinine, Ser: 1.07 mg/dL (ref 0.40–1.50)
GFR: 96.29 mL/min (ref >60.00)
Glucose, Bld: 119 mg/dL — ABNORMAL HIGH (ref 70–99)
Potassium: 3.2 mEq/L — ABNORMAL LOW (ref 3.5–5.1)
Sodium: 141 mEq/L (ref 135–145)

## 2015-08-10 LAB — TSH: TSH: 1.59 u[IU]/mL (ref 0.35–4.50)

## 2015-08-10 MED ORDER — LOSARTAN POTASSIUM-HCTZ 100-25 MG PO TABS: 1.0000 | ORAL_TABLET | Freq: Every day | ORAL | Status: DC

## 2015-08-10 MED ORDER — AMLODIPINE BESYLATE 10 MG PO TABS: 10.0000 mg | ORAL_TABLET | Freq: Every day | ORAL | Status: DC

## 2015-08-10 NOTE — Progress Notes (Signed)
   Subjective:    Patient ID: Andrew Sharp, male    DOB: 20-Jan-1971, 44 y.o.   MRN: [id number]  HPI Here to follow up HTN. He feels good and his BP is stable at home.    Review of Systems  Constitutional: Negative.   Respiratory: Negative.   Cardiovascular: Negative.   Neurological: Negative.        Objective:   Physical Exam  Constitutional: He is oriented to person, place, and time. He appears well-developed and well-nourished.  Neck: No thyromegaly present.  Cardiovascular: Normal rate, regular rhythm, normal heart sounds and intact distal pulses.   Pulmonary/Chest: Effort normal and breath sounds normal.  Lymphadenopathy:    He has no cervical adenopathy.  Neurological: He is alert and oriented to person, place, and time.          Assessment & Plan:  HTN is stable. Refilled meds. Get fasting labs today.

## 2015-08-10 NOTE — Progress Notes (Signed)
Pre visit review using our clinic review tool, if applicable. No additional management support is needed unless otherwise documented below in the visit note. 

## 2015-08-12 ENCOUNTER — Other Ambulatory Visit: Payer: Self-pay | Admitting: *Deleted

## 2015-08-12 MED ORDER — POTASSIUM CHLORIDE CRYS ER 20 MEQ PO TBCR: 20.0000 meq | EXTENDED_RELEASE_TABLET | Freq: Every day | ORAL | Status: DC

## 2015-08-17 ENCOUNTER — Telehealth: Payer: Self-pay | Admitting: Gastroenterology

## 2015-08-17 MED ORDER — DEXLANSOPRAZOLE 30 MG PO CPDR
30.0000 mg | DELAYED_RELEASE_CAPSULE | Freq: Two times a day (BID) | ORAL | Status: DC
Start: 2015-08-17 — End: 2015-12-14

## 2015-08-17 NOTE — Telephone Encounter (Signed)
Ok to refill until her can followup with new MD

## 2015-08-17 NOTE — Telephone Encounter (Signed)
Med sent to pharmacy Dexilant ok'd by Dr Hilarie Fredrickson

## 2015-08-17 NOTE — Telephone Encounter (Signed)
Tried to call patient back.. Voice mail not been set up yet. Could not leave a message

## 2015-08-17 NOTE — Telephone Encounter (Signed)
Dr Hilarie Fredrickson, You are doc of the day (Morning) .Marland Kitchen... This patient seen Dr Deatra Ina in September but needs a refill on his dexilant. Is it ok to refill for him?

## 2015-10-01 ENCOUNTER — Encounter: Payer: Self-pay | Admitting: Family Medicine

## 2015-10-01 ENCOUNTER — Ambulatory Visit (INDEPENDENT_AMBULATORY_CARE_PROVIDER_SITE_OTHER): Payer: 59 | Admitting: Family Medicine

## 2015-10-01 VITALS — BP 159/72 | HR 77 | Temp 98.9°F | Ht 68.5 in | Wt 245.0 lb

## 2015-10-01 DIAGNOSIS — R319 Hematuria, unspecified: Secondary | ICD-10-CM

## 2015-10-01 DIAGNOSIS — N390 Urinary tract infection, site not specified: Secondary | ICD-10-CM

## 2015-10-01 LAB — POCT URINALYSIS DIPSTICK
Bilirubin, UA: NEGATIVE
Glucose, UA: NEGATIVE
Ketones, UA: NEGATIVE
Nitrite, UA: NEGATIVE
Spec Grav, UA: 1.02
Urobilinogen, UA: 1
pH, UA: 6

## 2015-10-01 MED ORDER — CIPROFLOXACIN HCL 500 MG PO TABS: 500.0000 mg | ORAL_TABLET | Freq: Two times a day (BID) | ORAL | Status: DC

## 2015-10-01 NOTE — Progress Notes (Signed)
Pre visit review using our clinic review tool, if applicable. No additional management support is needed unless otherwise documented below in the visit note. 

## 2015-10-01 NOTE — Progress Notes (Signed)
   Subjective:    Patient ID: Andrew Sharp, male    DOB: 1970/12/18, 44 y.o.   MRN: [id number]  HPI Here for 2 days of seeing blood in his urine. He also has noticed some mild urinary urgency and burning for 2 weeks. No fever or back pain.    Review of Systems  Constitutional: Negative.   Genitourinary: Positive for dysuria, urgency, frequency and hematuria. Negative for flank pain, discharge, difficulty urinating and testicular pain.       Objective:   Physical Exam  Constitutional: He appears well-developed and well-nourished.  Abdominal: Soft. Bowel sounds are normal. He exhibits no distension and no mass. There is no tenderness. There is no rebound and no guarding.  Genitourinary: Rectum normal and prostate normal.          Assessment & Plan:  UTI, treat with Cipro for 14 days. Culture the sample and recheck in 14 days

## 2015-10-03 LAB — URINE CULTURE
Colony Count: NO GROWTH
Organism ID, Bacteria: NO GROWTH

## 2015-10-14 ENCOUNTER — Ambulatory Visit: Payer: 59 | Admitting: Family Medicine

## 2015-12-14 ENCOUNTER — Telehealth: Payer: Self-pay | Admitting: Gastroenterology

## 2015-12-14 MED ORDER — DEXLANSOPRAZOLE 30 MG PO CPDR
30.0000 mg | DELAYED_RELEASE_CAPSULE | Freq: Two times a day (BID) | ORAL | Status: DC
Start: 2015-12-14 — End: 2016-01-18

## 2015-12-14 NOTE — Telephone Encounter (Signed)
Unable to reach patient.

## 2015-12-14 NOTE — Telephone Encounter (Signed)
Rx sent to pharmacy. Line busy for patient. Will try again later.

## 2015-12-15 NOTE — Telephone Encounter (Signed)
Unable to reach patient because no mailbox set up.

## 2015-12-16 NOTE — Telephone Encounter (Signed)
Patient did not call back.   

## 2016-01-17 ENCOUNTER — Telehealth: Payer: Self-pay | Admitting: Family Medicine

## 2016-01-17 NOTE — Telephone Encounter (Signed)
Looks like a FYI 

## 2016-01-17 NOTE — Telephone Encounter (Signed)
Patient Name: Andrew Sharp  DOB: [birth year]    Initial Comment Caller states his BP is 190/110. Headache.   Nurse Assessment  Nurse: Raphael Gibney, RN, Vanita Ingles Date/Time (Eastern Time): 01/17/2016 11:27:43 AM  Confirm and document reason for call. If symptomatic, describe symptoms. You must click the next button to save text entered. ---Caller states he has BP 190/110 last night. BP 160/100 today. He has a headache. He takes antihypertensive medications.  Has the patient traveled out of the country within the last 30 days? ---No  Does the patient have any new or worsening symptoms? ---Yes  Will a triage be completed? ---Yes  Related visit to physician within the last 2 weeks? ---No  Does the PT have any chronic conditions? (i.e. diabetes, asthma, etc.) ---Yes  List chronic conditions. ---HTN, acid reflux  Is this a behavioral health or substance abuse call? ---No     Guidelines    Guideline Title Affirmed Question Affirmed Notes  High Blood Pressure BP ? 160/100    Final Disposition User   See PCP When Office is Open (within 3 days) Raphael Gibney, RN, Vanita Ingles    Comments  Appt scheduled at 2 pm on 01/18/16 with Dr. Alysia Penna   Disagree/Comply: Comply

## 2016-01-18 ENCOUNTER — Encounter: Payer: Self-pay | Admitting: Gastroenterology

## 2016-01-18 ENCOUNTER — Ambulatory Visit (INDEPENDENT_AMBULATORY_CARE_PROVIDER_SITE_OTHER): Payer: 59 | Admitting: Family Medicine

## 2016-01-18 ENCOUNTER — Encounter: Payer: Self-pay | Admitting: Family Medicine

## 2016-01-18 ENCOUNTER — Ambulatory Visit (INDEPENDENT_AMBULATORY_CARE_PROVIDER_SITE_OTHER): Payer: 59 | Admitting: Gastroenterology

## 2016-01-18 VITALS — BP 130/100 | HR 80 | Ht 69.0 in | Wt 250.0 lb

## 2016-01-18 VITALS — BP 154/96 | Temp 99.3°F | Ht 69.0 in | Wt 247.0 lb

## 2016-01-18 DIAGNOSIS — Z1211 Encounter for screening for malignant neoplasm of colon: Secondary | ICD-10-CM

## 2016-01-18 DIAGNOSIS — K219 Gastro-esophageal reflux disease without esophagitis: Secondary | ICD-10-CM

## 2016-01-18 DIAGNOSIS — I10 Essential (primary) hypertension: Secondary | ICD-10-CM | POA: Diagnosis not present

## 2016-01-18 MED ORDER — NA SULFATE-K SULFATE-MG SULF 17.5-3.13-1.6 GM/177ML PO SOLN: 1.0000 | Freq: Once | ORAL | Status: DC

## 2016-01-18 MED ORDER — DEXLANSOPRAZOLE 30 MG PO CPDR: 30.0000 mg | DELAYED_RELEASE_CAPSULE | Freq: Every day | ORAL | Status: DC

## 2016-01-18 MED ORDER — BACLOFEN 10 MG PO TABS: 10.0000 mg | ORAL_TABLET | Freq: Every day | ORAL | Status: DC

## 2016-01-18 MED ORDER — METOPROLOL SUCCINATE ER 50 MG PO TB24: 50.0000 mg | ORAL_TABLET | Freq: Every day | ORAL | Status: DC

## 2016-01-18 NOTE — Progress Notes (Signed)
HPI :  45 y/o male with a history of GERD and HTN, former patient of Dr. Deatra Ina, new to me, here for a follow up  He has had some GERD longstanding. He has some nocturnal symptoms of regurgitation and pyrosis at time. He usually feels this the most at night. He is taking Dexilant 30mg  BID - he thinks this works well for him. He has tried nexium, prilosec in the past - he did not think they provided as much benefit as the Dexilant does, and likes how it controls his symptoms for the most part when he takes it. If he stops the Dexliant he will have symptoms of pyrosis / regurgitation. He has been on Dexilant for about 2 years at this dose. He denies any dysphagia at this time. No odynophagia. No nausea or vomiting. He has had a normal barium study in the past and modified barium swallow showing no oropharyngeal dysphagia when he had previously complained of dysphagia. He had a EGD in 2013 with a ? GEJ ring which was dilated to 50mm and he had benefit at the time.  His last EGD done in 05/2015 was normal without stenosis or evidence of Barrett's.  He has an aunt who had colon cancer. No other family members with colon cancer. Normal bowel habits without blood in the stools at present time. He had a colonoscopy 10 years ago for rectal bleeding, hemorrhoids noted.   Endoscopic history: EGD 05/26/15 - normal exam EGD 02/06/12 - ? GEJ mild ring, dilated to 7mm Savory, he reported benefit Colonoscopy 03/18/06 - mild rectal erythema with nonspecific inflammation, hemorrhoids  Past Medical History  Diagnosis Date  . Hypertension   . GERD (gastroesophageal reflux disease)   . History of kidney stones   . Esophageal stricture      Past Surgical History  Procedure Laterality Date  . Knee arthroplasty Left   . Ct coronary ca scoring  09-19-11    for chest pains, negative   . Colonoscopy  03-16-06    per Dr. Deatra Ina, nonspecific colitis and int. hemorrhoids   Family History  Problem Relation Age of  Onset  . Colon cancer Maternal Aunt   . Pancreatic cancer Maternal Grandmother   . Crohn's disease Other     niece  . Diabetes Brother     MOTHER,AUNT  . Heart disease Mother   . Kidney disease Mother   . Prostate cancer Maternal Grandfather   . Esophageal cancer Neg Hx   . Rectal cancer Neg Hx   . Stomach cancer Neg Hx    Social History  Substance Use Topics  . Smoking status: Never Smoker   . Smokeless tobacco: Never Used  . Alcohol Use: No   Current Outpatient Prescriptions  Medication Sig Dispense Refill  . amLODipine (NORVASC) 10 MG tablet Take 1 tablet (10 mg total) by mouth daily. for high blood pressure 90 tablet 3  . Ascorbic Acid (VITAMIN C) 1000 MG tablet Take 1,000 mg by mouth daily.    Marland Kitchen Dexlansoprazole 30 MG capsule Take 1 capsule (30 mg total) by mouth 2 (two) times daily. 60 capsule 0  . losartan-hydrochlorothiazide (HYZAAR) 100-25 MG tablet Take 1 tablet by mouth daily. 90 tablet 3  . potassium chloride SA (K-DUR,KLOR-CON) 20 MEQ tablet Take 1 tablet (20 mEq total) by mouth daily. 90 tablet 3   No current facility-administered medications for this visit.   No Known Allergies   Review of Systems: All systems reviewed and negative except where  noted in HPI.   Lab Results  Component Value Date   CREATININE 1.07 08/10/2015   BUN 10 08/10/2015   NA 141 08/10/2015   K 3.2* 08/10/2015   CL 103 08/10/2015   CO2 32 08/10/2015    Lab Results  Component Value Date   WBC 8.3 08/10/2015   HGB 14.9 08/10/2015   HCT 43.1 08/10/2015   MCV 90.0 08/10/2015   PLT 213.0 08/10/2015    Lab Results  Component Value Date   ALT 28 08/10/2015   AST 24 08/10/2015   ALKPHOS 68 08/10/2015   BILITOT 0.5 08/10/2015    Physical Exam: BP 130/100 mmHg  Pulse 80  Ht 5\' 9"  (1.753 m)  Wt 250 lb (113.399 kg)  BMI 36.90 kg/m2 Constitutional: Pleasant,well-developed, male in no acute distress. HEENT: Normocephalic and atraumatic. Conjunctivae are normal. No scleral  icterus. Neck supple.  Cardiovascular: Normal rate, regular rhythm.  Pulmonary/chest: Effort normal and breath sounds normal. No wheezing, rales or rhonchi. Abdominal: Soft, nondistended, nontender. Bowel sounds active throughout. There are no masses palpable.  Extremities: no edema Lymphadenopathy: No cervical adenopathy noted. Neurological: Alert and oriented to person place and time. Skin: Skin is warm and dry. No rashes noted. Psychiatric: Normal mood and affect. Behavior is normal.   ASSESSMENT AND PLAN: 45 y/o AA male here for follow up for GERD, also in need of CRC screening.   GERD - longstanding, no history of Barrett's, on high dosing of Dexilant as outlined. I discussed the long term risks of PPIs with him. The risks of long term PPIs with current data include increased risk for chronic kidney disease, increased risk of fracture, increased risk of C diff, increased risk of pneumonia (short term usage), potentially increased risk of B12 / calcium deficiency, and rare risk of hypomagnesemia. Recent studies have also shown an association with increased risk of dementia and cardiovascular outcomes including stroke. These studies have showed an association between PPIs and several of these outcomes but no evidence of causality. Overall, I recommend the patient  to use the lowest daily use of PPI needed to control symptoms, or try a different regimen. Renal function is currently normal and would recommend checking this periodically while on PPI. At this time he will try reducing Dexilant to 30mg  once in the day to see how he does. For his nocturnal symptoms, I offered him a trial of baclofen for which there is evidence for benefit of nocturnal GERD and improves sleep quality. He will try this regimen and follow up as needed for symptoms.   CRC screening - average risk, due for CRC screening. I discussed modalities and he wished to proceed with optical colonoscopy. The indications, risks, and  benefits of colonoscopy were explained to the patient in detail. Risks include but are not limited to bleeding, perforation, adverse reaction to medications, and cardiopulmonary compromise. Sequelae include but are not limited to the possibility of surgery, hospitalization, and mortality. The patient verbalized understanding and wished to proceed. All questions answered, referred to the scheduler and bowel prep ordered. Further recommendations pending results of the exam.   [place] Cellar, MD [facility] Pager [phone]

## 2016-01-18 NOTE — Progress Notes (Signed)
   Subjective:    Patient ID: Andrew Sharp, male    DOB: [birth year], 45 y.o.   MRN: [id number]  HPI Here for high BP. He had not checked it for a few months until he had a few headaches last weekend. He got readings in the 160s over 100s. No chest pain or SOB. His weight is unchanged.    Review of Systems  Constitutional: Negative.   Respiratory: Negative.   Cardiovascular: Negative.   Endocrine: Negative.   Neurological: Negative.        Objective:   Physical Exam  Constitutional: He is oriented to person, place, and time. He appears well-developed and well-nourished.  Neck: No thyromegaly present.  Cardiovascular: Normal rate, regular rhythm, normal heart sounds and intact distal pulses.   Pulmonary/Chest: Effort normal and breath sounds normal.  Musculoskeletal: He exhibits no edema.  Lymphadenopathy:    He has no cervical adenopathy.  Neurological: He is alert and oriented to person, place, and time.          Assessment & Plan:  HTN. Add Metoprolol succinate 50 mg daily. Recheck one month

## 2016-01-18 NOTE — Patient Instructions (Signed)
We have sent Dexilant and Baclofen to your pharmacy today  You have been scheduled for a colonoscopy. Please follow written instructions given to you at your visit today.  Please pick up your prep supplies at the pharmacy within the next 1-3 days. If you use inhalers (even only as needed), please bring them with you on the day of your procedure. Your physician has requested that you go to www.startemmi.com and enter the access code given to you at your visit today. This web site gives a general overview about your procedure. However, you should still follow specific instructions given to you by our office regarding your preparation for the procedure.

## 2016-01-18 NOTE — Progress Notes (Signed)
Pre visit review using our clinic review tool, if applicable. No additional management support is needed unless otherwise documented below in the visit note. 

## 2016-01-27 ENCOUNTER — Ambulatory Visit (INDEPENDENT_AMBULATORY_CARE_PROVIDER_SITE_OTHER): Payer: 59 | Admitting: Family Medicine

## 2016-01-27 ENCOUNTER — Encounter: Payer: Self-pay | Admitting: Family Medicine

## 2016-01-27 ENCOUNTER — Telehealth: Payer: Self-pay | Admitting: Family Medicine

## 2016-01-27 VITALS — BP 144/100 | HR 76 | Temp 98.2°F | Wt 254.2 lb

## 2016-01-27 DIAGNOSIS — R310 Gross hematuria: Secondary | ICD-10-CM | POA: Diagnosis not present

## 2016-01-27 DIAGNOSIS — E876 Hypokalemia: Secondary | ICD-10-CM | POA: Diagnosis not present

## 2016-01-27 DIAGNOSIS — N200 Calculus of kidney: Secondary | ICD-10-CM | POA: Diagnosis not present

## 2016-01-27 DIAGNOSIS — I10 Essential (primary) hypertension: Secondary | ICD-10-CM

## 2016-01-27 LAB — URINALYSIS, MICROSCOPIC ONLY

## 2016-01-27 LAB — BASIC METABOLIC PANEL
BUN: 9 mg/dL (ref 6–23)
CO2: 31 mEq/L (ref 19–32)
Calcium: 9.5 mg/dL (ref 8.4–10.5)
Chloride: 102 mEq/L (ref 96–112)
Creatinine, Ser: 1.04 mg/dL (ref 0.40–1.50)
GFR: 99.29 mL/min (ref >60.00)
Glucose, Bld: 75 mg/dL (ref 70–99)
Potassium: 3.3 mEq/L — ABNORMAL LOW (ref 3.5–5.1)
Sodium: 141 mEq/L (ref 135–145)

## 2016-01-27 LAB — POC URINALSYSI DIPSTICK (AUTOMATED)
Bilirubin, UA: NEGATIVE
Glucose, UA: NEGATIVE
Ketones, UA: NEGATIVE
Nitrite, UA: NEGATIVE
Spec Grav, UA: 1.02
Urobilinogen, UA: 0.2
pH, UA: 7

## 2016-01-27 NOTE — Telephone Encounter (Signed)
Spoke to the pt.  He denied Back pain, flank pain, abdominal pain, dysuria and fever.  Appt scheduled with Dr. [place] at 1 PM today.

## 2016-01-27 NOTE — Patient Instructions (Addendum)
.   Blood in urine: Urology appt will be arranged. Adequate hydration.    - DASH diet recommended: high in vegetables, fruits, low-fat dairy products, whole grains, poultry, fish, and nuts; and low in sweets, sugar-sweetened beverages, and red meats.  Blood pressure goal < 140/90. Elevated blood pressure increases the risk of strokes, heart and kidney disease, and eye problems.   No changes today since medications were adjusted a week ago. Caution with Aleve, Acetaminophen is safer for kidneys. Go to ER if any or warning signs presents.  Monitor BP at home.

## 2016-01-27 NOTE — Progress Notes (Addendum)
Subjective:    Patient ID: Andrew Sharp, male    DOB: 18-Mar-1971, 45 y.o.   MRN: [id number]  HPI  Andrew  YOHANNES Sharp is a 45 y.o.male here today complaining of 3 days of gross hematuria with every urination and occasionally blood clots. He denies dysuria, nocturia, urine frequency. + Urine dribbling, reports prostate examination done recently. He has had similar episode about 2 months ago. He denies associated fever, chills, abdominal pain, nausea, vomiting, or diarrhea. Hx of nephrolithiasis, has not had an episode in many years. No other site of bleeding noted. No tobacco use. FHx + renal cancer maternal aunt.  Hypertension: Blood pressure is elevated today, according to patient his blood pressure medication was adjusted about a week ago. He is not checking his blood pressure at home, she has improved his diet and following a low-salt diet, and he is taking his medications as instructed, has tolerated it well with no side effects reported. In general his blood pressure has improved, last office visit his blood pressure was 160/110 (reported by patient). Currently he is on losartan-HCTZ 10-25 mg, Amlodipine 10 mg daily, and Metoprolol Succinate 50 mg daily. He takes Aleve as needed for knee pain.   K+ was low in 07/2015. He is on KCL 20 meq daily.   Lab Results  Component Value Date   CREATININE 1.07 08/10/2015   BUN 10 08/10/2015   NA 141 08/10/2015   K 3.2* 08/10/2015   CL 103 08/10/2015   CO2 32 08/10/2015   Lab Results  Component Value Date   WBC 8.3 08/10/2015   HGB 14.9 08/10/2015   HCT 43.1 08/10/2015   MCV 90.0 08/10/2015   PLT 213.0 08/10/2015     Review of Systems  Constitutional: Negative for fever, activity change, appetite change, fatigue and unexpected weight change.  HENT: Negative for mouth sores, nosebleeds, sore throat and trouble swallowing.   Eyes: Negative for redness and visual disturbance.  Respiratory: Negative for apnea, cough,  shortness of breath and wheezing.   Cardiovascular: Negative for chest pain, palpitations and leg swelling.  Gastrointestinal: Negative for nausea, vomiting, abdominal pain, diarrhea and blood in stool.  Genitourinary: Positive for urgency and hematuria. Negative for dysuria, frequency, flank pain, decreased urine volume, discharge and testicular pain.       Occasional urine dribbling.  Musculoskeletal: Positive for arthralgias (knee pain,chronic). Negative for myalgias and back pain.  Neurological: Negative for dizziness, seizures, weakness, numbness and headaches.  Hematological: Negative for adenopathy. Does not bruise/bleed easily.  Psychiatric/Behavioral: Negative.         Current Outpatient Prescriptions on File Prior to Visit  Medication Sig Dispense Refill  . amLODipine (NORVASC) 10 MG tablet Take 1 tablet (10 mg total) by mouth daily. for high blood pressure 90 tablet 3  . Ascorbic Acid (VITAMIN C) 1000 MG tablet Take 1,000 mg by mouth daily.    . baclofen (LIORESAL) 10 MG tablet Take 1 tablet (10 mg total) by mouth at bedtime. 90 each 0  . Dexlansoprazole (DEXILANT) 30 MG capsule Take 1 capsule (30 mg total) by mouth daily. 90 capsule 3  . losartan-hydrochlorothiazide (HYZAAR) 100-25 MG tablet Take 1 tablet by mouth daily. 90 tablet 3  . metoprolol succinate (TOPROL-XL) 50 MG 24 hr tablet Take 1 tablet (50 mg total) by mouth daily. Take with or immediately following a meal. 90 tablet 3  . Na Sulfate-K Sulfate-Mg Sulf (SUPREP BOWEL PREP) SOLN Take 1 kit by mouth once. 1 Bottle 0  .  potassium chloride SA (K-DUR,KLOR-CON) 20 MEQ tablet Take 1 tablet (20 mEq total) by mouth daily. 90 tablet 3   No current facility-administered medications on file prior to visit.     Past Medical History  Diagnosis Date  . Hypertension   . GERD (gastroesophageal reflux disease)   . History of kidney stones   . Esophageal stricture     Social History   Social History  . Marital Status:  Married    Spouse Name: N/A  . Number of Children: 1  . Years of Education: N/A   Occupational History  . MANAGER    Social History Main Topics  . Smoking status: Never Smoker   . Smokeless tobacco: Never Used  . Alcohol Use: No  . Drug Use: No  . Sexual Activity: Not Asked   Other Topics Concern  . None   Social History Narrative      Filed Vitals:   01/27/16 1310  BP: 144/100  Pulse: 76  Temp: 98.2 F (36.8 C)   Body mass index is 37.52 kg/(m^2).    Objective:   Physical Exam  Constitutional: He is oriented to person, place, and time. He appears well-developed and well-nourished. No distress.  HENT:  Head: Atraumatic.  Mouth/Throat: Uvula is midline and oropharynx is clear and moist. No oral lesions.  Eyes: Conjunctivae and EOM are normal. Pupils are equal, round, and reactive to light.  Cardiovascular: Normal rate and regular rhythm.   No murmur heard. Pulses:      Dorsalis pedis pulses are 2+ on the right side, and 2+ on the left side.  Trace pitting edema LE bilateral.  Pulmonary/Chest: Effort normal. He has no wheezes. He has no rales.  Abdominal: Soft. He exhibits no distension and no mass. There is no hepatosplenomegaly. There is no tenderness. There is no CVA tenderness.  Lymphadenopathy:    He has no cervical adenopathy.  Neurological: He is alert and oriented to person, place, and time. Coordination and gait normal.  Skin:  No ecchymosis or erythematous lesions appreciated.  Psychiatric: He has a normal mood and affect.  Vitals reviewed.  Laboratory: Urine dip done today abnormal, blood 3+     Assessment & Plan:    Evaristo was seen today for hematuria.  Diagnoses and all orders for this visit:  Gross hematuria Possible causes discussed. Avoid food/drinks that can irritate bladder (spicy food, caffeine drinks). Clearly instructed about warning signs.  -     POCT Urinalysis Dipstick (Automated) -     Basic Metabolic Panel -      Culture, Urine -     Urine Microscopic Only -     Ambulatory referral to Urology  Essential hypertension Re-checked 145/95. Since his medications were adjusted a week ago, no changes today. Caution with NSAID's. Possible complications of elevated BP discussed and instructed about warning signs. F/U in 4 weeks.  -     Basic Metabolic Panel  Nephrolithiasis Remote Hx. No symptoms that suggest nephrolithiasis. Adequate hydration.  Hypokalemia Mild. Will give further recommendations according to lab results.  -     Basic Metabolic Panel   Lab Results  Component Value Date   CREATININE 1.04 01/27/2016   BUN 9 01/27/2016   NA 141 01/27/2016   K 3.3* 01/27/2016   CL 102 01/27/2016   CO2 31 01/27/2016     Andrew Trim G. Martinique, MD  [facility]. [place] office.

## 2016-01-27 NOTE — Progress Notes (Signed)
Pre visit review using our clinic review tool, if applicable. No additional management support is needed unless otherwise documented below in the visit note. 

## 2016-01-27 NOTE — Telephone Encounter (Signed)
Pt came in on 10/01/15 with blood in urine and the blood has come back but heavier.  Would like a call back.

## 2016-01-28 ENCOUNTER — Other Ambulatory Visit: Payer: Self-pay | Admitting: *Deleted

## 2016-01-28 MED ORDER — POTASSIUM CHLORIDE CRYS ER 20 MEQ PO TBCR: 20.0000 meq | EXTENDED_RELEASE_TABLET | Freq: Two times a day (BID) | ORAL | Status: DC

## 2016-01-28 NOTE — Addendum Note (Signed)
Addended by: Martinique, Andie Mungin G on: 01/28/2016 03:16 PM   Modules accepted: Miquel Dunn

## 2016-01-29 LAB — URINE CULTURE
Colony Count: NO GROWTH
Organism ID, Bacteria: NO GROWTH

## 2016-02-25 ENCOUNTER — Encounter: Payer: Self-pay | Admitting: Gastroenterology

## 2016-03-09 ENCOUNTER — Encounter: Payer: 59 | Admitting: Gastroenterology

## 2016-03-10 ENCOUNTER — Telehealth: Payer: Self-pay | Admitting: Gastroenterology

## 2016-03-10 NOTE — Telephone Encounter (Signed)
No that's okay but please reschedule him if possible. Thanks

## 2016-03-13 ENCOUNTER — Encounter: Payer: 59 | Admitting: Gastroenterology

## 2016-03-13 NOTE — Telephone Encounter (Signed)
Patient has rescheduled for 03-27-16

## 2016-03-23 ENCOUNTER — Encounter: Payer: 59 | Admitting: Gastroenterology

## 2016-03-27 ENCOUNTER — Ambulatory Visit (AMBULATORY_SURGERY_CENTER): Payer: 59 | Admitting: Gastroenterology

## 2016-03-27 ENCOUNTER — Encounter: Payer: Self-pay | Admitting: Gastroenterology

## 2016-03-27 VITALS — BP 136/96 | HR 69 | Temp 98.4°F | Resp 26 | Ht 69.0 in | Wt 250.0 lb

## 2016-03-27 DIAGNOSIS — D120 Benign neoplasm of cecum: Secondary | ICD-10-CM

## 2016-03-27 DIAGNOSIS — Z1211 Encounter for screening for malignant neoplasm of colon: Secondary | ICD-10-CM

## 2016-03-27 MED ORDER — SODIUM CHLORIDE 0.9 % IV SOLN: 500.0000 mL | INTRAVENOUS | Status: DC

## 2016-03-27 NOTE — Patient Instructions (Addendum)
YOU HAD AN ENDOSCOPIC PROCEDURE TODAY AT [facility]:   Refer to the procedure report that was given to you for any specific questions about what was found during the examination.  If the procedure report does not answer your questions, please call your gastroenterologist to clarify.  If you requested that your care partner not be given the details of your procedure findings, then the procedure report has been included in a sealed envelope for you to review at your convenience later.  YOU SHOULD EXPECT: Some feelings of bloating in the abdomen. Passage of more gas than usual.  Walking can help get rid of the air that was put into your GI tract during the procedure and reduce the bloating. If you had a lower endoscopy (such as a colonoscopy or flexible sigmoidoscopy) you may notice spotting of blood in your stool or on the toilet paper. If you underwent a bowel prep for your procedure, you may not have a normal bowel movement for a few days.  Please Note:  You might notice some irritation and congestion in your nose or some drainage.  This is from the oxygen used during your procedure.  There is no need for concern and it should clear up in a day or so.  SYMPTOMS TO REPORT IMMEDIATELY:   Following lower endoscopy (colonoscopy or flexible sigmoidoscopy):  Excessive amounts of blood in the stool  Significant tenderness or worsening of abdominal pains  Swelling of the abdomen that is new, acute  Fever of 100F or higher   For urgent or emergent issues, a gastroenterologist can be reached at any hour by calling [phone].   DIET: Your first meal following the procedure should be a small meal and then it is ok to progress to your normal diet. Heavy or fried foods are harder to digest and may make you feel nauseous or bloated.  Likewise, meals heavy in dairy and vegetables can increase bloating.  Drink plenty of fluids but you should avoid alcoholic beverages for 24  hours.  ACTIVITY:  You should plan to take it easy for the rest of today and you should NOT DRIVE or use heavy machinery until tomorrow (because of the sedation medicines used during the test).    FOLLOW UP: Our staff will call the number listed on your records the next business day following your procedure to check on you and address any questions or concerns that you may have regarding the information given to you following your procedure. If we do not reach you, we will leave a message.  However, if you are feeling well and you are not experiencing any problems, there is no need to return our call.  We will assume that you have returned to your regular daily activities without incident.  If any biopsies were taken you will be contacted by phone or by letter within the next 1-3 weeks.  Please call us at [phone] if you have not heard about the biopsies in 3 weeks.    SIGNATURES/CONFIDENTIALITY: You and/or your care partner have signed paperwork which will be entered into your electronic medical record.  These signatures attest to the fact that that the information above on your After Visit Summary has been reviewed and is understood.  Full responsibility of the confidentiality of this discharge information lies with you and/or your care-partner.    Handouts were given to your care partner on polyps, diverticulosis, hemorrhoids, and a high fiber diet with liberal fluid intake. No aspirin,  aspirin products,  ibuprofen, naproxen, advil, motrin, aleve, or other non-steroidal anti-inflammatory drugs for 14 days after polyp removal.  Tylenol is ok if needed. You may resume your other current medications today. Await biopsy results. Please call if any questions or concerns.

## 2016-03-27 NOTE — Op Note (Signed)
[facility] Patient Name: Andrew Sharp Procedure Date: 03/27/2016 3:44 PM MRN: [id number] Endoscopist: Remo Lipps P. Havery Moros , MD Age: 45 Referring MD:  Date of Birth: 07/24/1971 Gender: Male Procedure:                Colonoscopy Indications:              Screening for malignant neoplasm in the colon Medicines:                Monitored Anesthesia Care Procedure:                Pre-Anesthesia Assessment:                           - Prior to the procedure, a History and Physical                            was performed, and patient medications and                            allergies were reviewed. The patient's tolerance of                            previous anesthesia was also reviewed. The risks                            and benefits of the procedure and the sedation                            options and risks were discussed with the patient.                            All questions were answered, and informed consent                            was obtained. Prior Anticoagulants: The patient has                            taken no previous anticoagulant or antiplatelet                            agents. ASA Grade Assessment: II - A patient with                            mild systemic disease. After reviewing the risks                            and benefits, the patient was deemed in                            satisfactory condition to undergo the procedure.                           After obtaining informed consent, the colonoscope  was passed under direct vision. Throughout the                            procedure, the patient's blood pressure, pulse, and                            oxygen saturations were monitored continuously. The                            Model CF-HQ190L [id number]) scope was introduced                            through the anus and advanced to the the terminal                            ileum, with identification of  the appendiceal                            orifice and IC valve. The colonoscopy was performed                            without difficulty. The patient tolerated the                            procedure well. The quality of the bowel                            preparation was good. The terminal ileum, ileocecal                            valve, appendiceal orifice, and rectum were                            photographed. Scope In: 3:52:36 PM Scope Out: 4:10:42 PM Scope Withdrawal Time: 0 hours 15 minutes 47 seconds  Total Procedure Duration: 0 hours 18 minutes 6 seconds  Findings:                 The perianal and digital rectal examinations were                            normal.                           A 4 mm polyp was found in the cecum. The polyp was                            sessile. The polyp was removed with a cold snare.                            Resection and retrieval were complete.                           A few small-mouthed diverticula were found in the  sigmoid colon.                           Non-bleeding internal hemorrhoids were found during                            retroflexion.                           The terminal ileum appeared normal.                           The exam was otherwise normal throughout the                            examined colon. Complications:            No immediate complications. Estimated blood loss:                            Minimal. Estimated Blood Loss:     Estimated blood loss was minimal. Impression:               - One 4 mm polyp in the cecum, removed with a cold                            snare. Resected and retrieved.                           - Diverticulosis in the sigmoid colon.                           - Non-bleeding internal hemorrhoids.                           - The examined portion of the ileum was normal. Recommendation:           - Patient has a contact number available for                             emergencies. The signs and symptoms of potential                            delayed complications were discussed with the                            patient. Return to normal activities tomorrow.                            Written discharge instructions were provided to the                            patient.                           - Resume previous diet.                           -  Continue present medications.                           - No aspirin, ibuprofen, naproxen, or other                            non-steroidal anti-inflammatory drugs for 2 weeks                            after polyp removal.                           - Await pathology results.                           - Repeat colonoscopy is recommended for                            surveillance. The colonoscopy date will be                            determined after pathology results from today's                            exam become available for review. Remo Lipps P. Prakash Kimberling, MD 03/27/2016 4:14:29 PM This report has been signed electronically.

## 2016-03-27 NOTE — Progress Notes (Signed)
Pt's wife requested a work note, she had to leave work early today for her husband's colonoscopy.  Note given to pt's wife.   No problems noted in the recovery room. maw

## 2016-03-27 NOTE — Progress Notes (Signed)
Report given to PACU RN, vss 

## 2016-03-27 NOTE — Progress Notes (Signed)
Called to room to assist during endoscopic procedure.  Patient ID and intended procedure confirmed with present staff. Received instructions for my participation in the procedure from the performing physician.  

## 2016-03-28 ENCOUNTER — Telehealth: Payer: Self-pay

## 2016-03-28 NOTE — Telephone Encounter (Signed)
  Follow up Call-  Call back number 03/27/2016 05/26/2015  Post procedure Call Back phone  # [phone]  Permission to leave phone message Yes Yes     Patient questions:  Do you have a fever, pain , or abdominal swelling? No. Pain Score  0 *  Have you tolerated food without any problems? Yes.    Have you been able to return to your normal activities? Yes.    Do you have any questions about your discharge instructions: Diet   No. Medications  No. Follow up visit  No.  Do you have questions or concerns about your Care? No.  Actions: * If pain score is 4 or above: No action needed, pain <4.

## 2016-03-30 ENCOUNTER — Encounter: Payer: Self-pay | Admitting: Gastroenterology

## 2016-06-30 ENCOUNTER — Encounter: Payer: Self-pay | Admitting: Oncology

## 2016-06-30 ENCOUNTER — Telehealth: Payer: Self-pay | Admitting: Oncology

## 2016-06-30 NOTE — Telephone Encounter (Signed)
Offered appt date/time but pt will be out of town until 9/13, offered 1 st available, Pt confirmed appt, completed intake, mailed letter, and faxed referring provider appt date/time.

## 2016-07-11 ENCOUNTER — Telehealth: Payer: Self-pay | Admitting: Oncology

## 2016-07-11 ENCOUNTER — Ambulatory Visit (HOSPITAL_BASED_OUTPATIENT_CLINIC_OR_DEPARTMENT_OTHER): Payer: 59 | Admitting: Oncology

## 2016-07-11 VITALS — BP 149/100 | HR 65 | Temp 98.6°F | Resp 18 | Ht 69.0 in | Wt 253.5 lb

## 2016-07-11 DIAGNOSIS — R319 Hematuria, unspecified: Secondary | ICD-10-CM

## 2016-07-11 DIAGNOSIS — I10 Essential (primary) hypertension: Secondary | ICD-10-CM | POA: Diagnosis not present

## 2016-07-11 DIAGNOSIS — R591 Generalized enlarged lymph nodes: Secondary | ICD-10-CM

## 2016-07-11 NOTE — Telephone Encounter (Signed)
Gave patient avs report and appointments for October. Central radiology will call re bx - patient aware.

## 2016-07-11 NOTE — Progress Notes (Signed)
Reason for Referral: Lymphadenopathy.   HPI: 45 year old gentleman currently of [place] where he lived the majority of his life. He is a gentleman with history of hypertension, obesity and GERD. He developed a hematuria in May 2017 and was evaluated by Dr. Matilde Sprang and underwent a CT scan of the abdomen and pelvis without contrast on 06/12/2016. His CT scan showed left external and iliac inguinal adenopathy measuring 2.2 cm in the left external iliac as well as left inguinal lymph node measuring 2.1 cm. No other lymphadenopathy noted. No other malignancies noted in any other organs. His urological evaluation was unremarkable for any other malignancy. She was referred to me for evaluation regarding these adenopathy. He does not report any recent trauma or surgery. He did have quadriceps injury close to 5 years ago. He does not report any constitutional symptoms or recent infections. Has not reported any weight loss or appetite changes. Has not reported a decline his energy or performance status. He does not report any urination difficulties or change in his bowel habits.  He does not report any headaches, blurry vision, syncope or seizures. He does not report any fevers or chills or sweats. He does not report any cough, wheezing or hemoptysis. He does not report any chest pain, palpitation orthopnea. He does not report any nausea, vomiting, abdominal pain or changes bowel habits. He does not report any skeletal complaints. He denied any palpable adenopathy, petechiae or bruising. Remaining review of systems unremarkable.   Past Medical History:  Diagnosis Date  . Esophageal stricture   . GERD (gastroesophageal reflux disease)   . History of kidney stones   . Hypertension   :  Past Surgical History:  Procedure Laterality Date  . COLONOSCOPY  03-16-06   per Dr. Deatra Ina, nonspecific colitis and int. hemorrhoids  . CT CORONARY CA SCORING  09-19-11   for chest pains, negative   . KNEE ARTHROPLASTY  Left   :   Current Outpatient Prescriptions:  .  amLODipine (NORVASC) 10 MG tablet, Take 1 tablet (10 mg total) by mouth daily. for high blood pressure, Disp: 90 tablet, Rfl: 3 .  Ascorbic Acid (VITAMIN C) 1000 MG tablet, Take 1,000 mg by mouth daily., Disp: , Rfl:  .  baclofen (LIORESAL) 10 MG tablet, Take 1 tablet (10 mg total) by mouth at bedtime., Disp: 90 each, Rfl: 0 .  Dexlansoprazole (DEXILANT) 30 MG capsule, Take 1 capsule (30 mg total) by mouth daily., Disp: 90 capsule, Rfl: 3 .  losartan-hydrochlorothiazide (HYZAAR) 100-25 MG tablet, Take 1 tablet by mouth daily., Disp: 90 tablet, Rfl: 3 .  metoprolol succinate (TOPROL-XL) 50 MG 24 hr tablet, Take 1 tablet (50 mg total) by mouth daily. Take with or immediately following a meal., Disp: 90 tablet, Rfl: 3 .  potassium chloride SA (K-DUR,KLOR-CON) 20 MEQ tablet, Take 1 tablet (20 mEq total) by mouth 2 (two) times daily., Disp: 180 tablet, Rfl: 3:  No Known Allergies:  Family History  Problem Relation Age of Onset  . Colon cancer Maternal Aunt   . Pancreatic cancer Maternal Grandmother   . Crohn's disease Other     niece  . Diabetes Brother     MOTHER,AUNT  . Heart disease Mother   . Kidney disease Mother   . Prostate cancer Maternal Grandfather   . Esophageal cancer Neg Hx   . Rectal cancer Neg Hx   . Stomach cancer Neg Hx   :  Social History   Social History  . Marital status: Married  Spouse name: N/A  . Number of children: 1  . Years of education: N/A   Occupational History  . [employer]    Social History Main Topics  . Smoking status: Never Smoker  . Smokeless tobacco: Never Used  . Alcohol use No  . Drug use: No  . Sexual activity: Not on file   Other Topics Concern  . Not on file   Social History Narrative  . No narrative on file  :  Pertinent items are noted in HPI.  Exam: Blood pressure (!) 149/100, pulse 65, temperature 98.6 F (37 C), temperature source Oral, resp.  rate 18, height 5\' 9"  (1.753 m), weight 253 lb 8 oz (115 kg), SpO2 99 %. General appearance: alert and cooperative Head: Normocephalic, without obvious abnormality Throat: lips, mucosa, and tongue normal; teeth and gums normal Neck: no adenopathy Back: negative Resp: clear to auscultation bilaterally Chest wall: no tenderness Cardio: regular rate and rhythm, S1, S2 normal, no murmur, click, rub or gallop GI: soft, non-tender; bowel sounds normal; no masses,  no organomegaly Extremities: extremities normal, atraumatic, no cyanosis or edema  CBC    Component Value Date/Time   WBC 8.3 08/10/2015 0856   RBC 4.79 08/10/2015 0856   HGB 14.9 08/10/2015 0856   HCT 43.1 08/10/2015 0856   PLT 213.0 08/10/2015 0856   MCV 90.0 08/10/2015 0856   MCH 30.9 09/19/2011 1125   MCHC 34.6 08/10/2015 0856   RDW 13.4 08/10/2015 0856   LYMPHSABS 2.9 08/10/2015 0856   MONOABS 0.9 08/10/2015 0856   EOSABS 0.3 08/10/2015 0856   BASOSABS 0.0 08/10/2015 0856      Chemistry      Component Value Date/Time   NA 141 01/27/2016 1352   K 3.3 (L) 01/27/2016 1352   CL 102 01/27/2016 1352   CO2 31 01/27/2016 1352   BUN 9 01/27/2016 1352   CREATININE 1.04 01/27/2016 1352      Component Value Date/Time   CALCIUM 9.5 01/27/2016 1352   ALKPHOS 68 08/10/2015 0856   AST 24 08/10/2015 0856   ALT 28 08/10/2015 0856   BILITOT 0.5 08/10/2015 0856       Assessment and Plan:   45 year old gentleman with the following issues:  1. Left external iliac and left inguinal adenopathy noted on the CT scan incidentally in August 2017. His lymph nodes measuring around 2.2 and 2.1 cm. He has no other signs or symptoms at this time to suggest malignancy or lymphoma. He denied any recent trauma, hospitalizations or infection.  The differential diagnosis was discussed today with the patient. This could be reactive lymphadenopathy related to previous trauma, surgery or infection. Scattered also be related to nephrolithiasis  and irritation regarding that. Malignancy cannot be ruled out at this time. This would include metastatic malignancy into the lymph glands from a GI or GU etiology. This could also be a lymphoproliferative disorder or lymphoma.  Options of treatment were discussed today which include observation and surveillance and repeat imaging studies in 3 months or obtaining tissue biopsy now. After discussing these options he elected to proceed with a biopsy now. I will make the appropriate referral for interventional radiology for a tissue biopsy. If this cannot be done, surgical excision might be needed.  Upon completing the biopsy, he will return to discuss the results.  2. Hematuria: Related to nephrolithiasis and appear to have subsided at this time.  3. Follow-up: Will be in the next few weeks to discuss the results of  the biopsy.

## 2016-07-18 ENCOUNTER — Other Ambulatory Visit: Payer: Self-pay | Admitting: Radiology

## 2016-07-20 ENCOUNTER — Ambulatory Visit (HOSPITAL_COMMUNITY)
Admission: RE | Admit: 2016-07-20 | Discharge: 2016-07-20 | Disposition: A | Discharge status: Home / Self Care | Payer: 59 | Source: Ambulatory Visit | Attending: Oncology | Admitting: Oncology

## 2016-07-20 ENCOUNTER — Encounter (HOSPITAL_COMMUNITY): Payer: Self-pay

## 2016-07-20 DIAGNOSIS — K219 Gastro-esophageal reflux disease without esophagitis: Secondary | ICD-10-CM | POA: Diagnosis not present

## 2016-07-20 DIAGNOSIS — K222 Esophageal obstruction: Secondary | ICD-10-CM | POA: Diagnosis not present

## 2016-07-20 DIAGNOSIS — I10 Essential (primary) hypertension: Secondary | ICD-10-CM | POA: Insufficient documentation

## 2016-07-20 DIAGNOSIS — Z79899 Other long term (current) drug therapy: Secondary | ICD-10-CM | POA: Insufficient documentation

## 2016-07-20 DIAGNOSIS — Z9889 Other specified postprocedural states: Secondary | ICD-10-CM | POA: Insufficient documentation

## 2016-07-20 DIAGNOSIS — Z87442 Personal history of urinary calculi: Secondary | ICD-10-CM | POA: Insufficient documentation

## 2016-07-20 DIAGNOSIS — R591 Generalized enlarged lymph nodes: Secondary | ICD-10-CM | POA: Diagnosis present

## 2016-07-20 LAB — CBC WITH DIFFERENTIAL/PLATELET
Basophils Absolute: 0 10*3/uL (ref 0.0–0.1)
Basophils Relative: 0 %
Eosinophils Absolute: 0.3 10*3/uL (ref 0.0–0.7)
Eosinophils Relative: 3 %
HCT: 40.5 % (ref 39.0–52.0)
Hemoglobin: 14.6 g/dL (ref 13.0–17.0)
Lymphocytes Relative: 30 %
Lymphs Abs: 2.7 10*3/uL (ref 0.7–4.0)
MCH: 31.1 pg (ref 26.0–34.0)
MCHC: 36 g/dL (ref 30.0–36.0)
MCV: 86.2 fL (ref 78.0–100.0)
Monocytes Absolute: 0.9 10*3/uL (ref 0.1–1.0)
Monocytes Relative: 10 %
Neutro Abs: 5 10*3/uL (ref 1.7–7.7)
Neutrophils Relative %: 57 %
Platelets: 215 10*3/uL (ref 150–400)
RBC: 4.7 MIL/uL (ref 4.22–5.81)
RDW: 13.1 % (ref 11.5–15.5)
WBC: 8.9 10*3/uL (ref 4.0–10.5)

## 2016-07-20 LAB — APTT: aPTT: 29 seconds (ref 24–36)

## 2016-07-20 LAB — PROTIME-INR
INR: 0.95
Prothrombin Time: 12.7 seconds (ref 11.4–15.2)

## 2016-07-20 MED ORDER — FENTANYL CITRATE (PF) 100 MCG/2ML IJ SOLN
INTRAMUSCULAR | Status: AC
  Filled 2016-07-20: qty 2

## 2016-07-20 MED ORDER — NALOXONE HCL 0.4 MG/ML IJ SOLN
INTRAMUSCULAR | Status: AC
  Filled 2016-07-20: qty 1

## 2016-07-20 MED ORDER — MIDAZOLAM HCL 2 MG/2ML IJ SOLN
INTRAMUSCULAR | Status: AC | PRN
  Administered 2016-07-20 (×2): 1 mg via INTRAVENOUS

## 2016-07-20 MED ORDER — MIDAZOLAM HCL 2 MG/2ML IJ SOLN
INTRAMUSCULAR | Status: AC
Start: 2016-07-20 — End: 2016-07-21
  Filled 2016-07-20: qty 4

## 2016-07-20 MED ORDER — SODIUM CHLORIDE 0.9 % IV SOLN: INTRAVENOUS | Status: DC

## 2016-07-20 MED ORDER — FLUMAZENIL 0.5 MG/5ML IV SOLN
INTRAVENOUS | Status: AC
  Filled 2016-07-20: qty 5

## 2016-07-20 MED ORDER — FENTANYL CITRATE (PF) 100 MCG/2ML IJ SOLN
INTRAMUSCULAR | Status: AC | PRN
  Administered 2016-07-20 (×2): 50 ug via INTRAVENOUS

## 2016-07-20 NOTE — Procedures (Signed)
L inguinal LN Bx 18 g times 5 No comp/EBL

## 2016-07-20 NOTE — Consult Note (Signed)
Chief Complaint: Patient was seen in consultation today for US guided left inguinal lymph node biopsy  Referring Physician(s): Wyatt Portela  Supervising Physician: Marybelle Killings  Patient Status: Outpatient  History of Present Illness: Andrew Sharp is a 45 y.o. male with history of GERD, nephrolithiasis and hypertension who underwent CT scan of the abdomen/ pelvis in May of this year secondary to hematuria. This revealed left external  Iliac/ inguinal adenopathy . There was no other lymphadenopathy noted. He has no prior history of cancer, recent surgery/ infections or trauma. He presents today for image guided biopsy of the enlarged left inguinal lymph node for further evaluation.  Past Medical History:  Diagnosis Date  . Esophageal stricture   . GERD (gastroesophageal reflux disease)   . History of kidney stones   . Hypertension     Past Surgical History:  Procedure Laterality Date  . COLONOSCOPY  03-16-06   per Dr. Deatra Ina, nonspecific colitis and int. hemorrhoids  . CT CORONARY CA SCORING  09-19-11   for chest pains, negative   . KNEE ARTHROPLASTY Left     Allergies: Review of patient's allergies indicates no known allergies.  Medications: Prior to Admission medications   Medication Sig Start Date End Date Taking? Authorizing Provider  amLODipine (NORVASC) 10 MG tablet Take 1 tablet (10 mg total) by mouth daily. for high blood pressure 08/10/15   Laurey Morale, MD  Ascorbic Acid (VITAMIN C) 1000 MG tablet Take 1,000 mg by mouth daily.    Historical Provider, MD  baclofen (LIORESAL) 10 MG tablet Take 1 tablet (10 mg total) by mouth at bedtime. 01/18/16   Manus Gunning, MD  Dexlansoprazole (DEXILANT) 30 MG capsule Take 1 capsule (30 mg total) by mouth daily. 01/18/16   Manus Gunning, MD  losartan-hydrochlorothiazide (HYZAAR) 100-25 MG tablet Take 1 tablet by mouth daily. 08/10/15   Laurey Morale, MD  metoprolol succinate (TOPROL-XL) 50 MG 24 hr  tablet Take 1 tablet (50 mg total) by mouth daily. Take with or immediately following a meal. 01/18/16   Laurey Morale, MD  potassium chloride SA (K-DUR,KLOR-CON) 20 MEQ tablet Take 1 tablet (20 mEq total) by mouth 2 (two) times daily. 01/28/16   Betty G Martinique, MD     Family History  Problem Relation Age of Onset  . Colon cancer Maternal Aunt   . Pancreatic cancer Maternal Grandmother   . Crohn's disease Other     niece  . Diabetes Brother     MOTHER,AUNT  . Heart disease Mother   . Kidney disease Mother   . Prostate cancer Maternal Grandfather   . Esophageal cancer Neg Hx   . Rectal cancer Neg Hx   . Stomach cancer Neg Hx     Social History   Social History  . Marital status: Married    Spouse name: N/A  . Number of children: 1  . Years of education: N/A   Occupational History  . [employer]    Social History Main Topics  . Smoking status: Never Smoker  . Smokeless tobacco: Never Used  . Alcohol use No  . Drug use: No  . Sexual activity: Not on file   Other Topics Concern  . Not on file   Social History Narrative  . No narrative on file      Review of Systems patient currently denies fever, headache, chest pain, dyspnea, cough, abdominal pain, back pain, nausea, vomiting or abnormal bleeding. He does report  some achiness,"pulling " sensation in left groin/thigh region over past several months.   Vital Signs:   Physical Exam patient awake, alert. Chest clear to auscultation bilaterally. Heart with regular rate and rhythm. Abdomen soft, positive bowel sounds, nontender. Lower extremities with no significant edema.  Mallampati Score:     Imaging: No results found.  Labs:  CBC:  Recent Labs  08/10/15 0856  WBC 8.3  HGB 14.9  HCT 43.1  PLT 213.0    COAGS: No results for input(s): INR, APTT in the last 8760 hours.  BMP:  Recent Labs  08/10/15 0856 01/27/16 1352  NA 141 141  K 3.2* 3.3*  CL 103 102  CO2 32 31  GLUCOSE  119* 75  BUN 10 9  CALCIUM 9.3 9.5  CREATININE 1.07 1.04    LIVER FUNCTION TESTS:  Recent Labs  08/10/15 0856  BILITOT 0.5  AST 24  ALT 28  ALKPHOS 68  PROT 7.1  ALBUMIN 4.1    TUMOR MARKERS: No results for input(s): AFPTM, CEA, CA199, CHROMGRNA in the last 8760 hours.  Assessment and Plan: 45 y.o. male with history of GERD, nephrolithiasis and hypertension who underwent CT scan of the abdomen/ pelvis in May of this year secondary to hematuria. This revealed left external  Iliac/ inguinal adenopathy . There was no other lymphadenopathy noted. He has no prior history of cancer, recent surgery/ infections or trauma. He presents today for image guided biopsy of the enlarged left inguinal lymph node for further evaluation.Risks and benefits discussed with the patient including, but not limited to bleeding, infection, damage to adjacent structures or low yield requiring additional tests.All of the patient's questions were answered, patient is agreeable to proceed.Consent signed and in chart.     Thank you for this interesting consult.  I greatly enjoyed meeting Andrew Sharp and look forward to participating in their care.  A copy of this report was sent to the requesting provider on this date.  Electronically Signed: D. Rowe Robert 07/20/2016, 10:47 AM   I spent a total of  25 minutes   in face to face in clinical consultation, greater than 50% of which was counseling/coordinating care for image guided biopsy of left inguinal lymph node

## 2016-07-20 NOTE — Discharge Instructions (Signed)
Needle Biopsy, Care After °These instructions give you information about caring for yourself after your procedure. Your doctor may also give you more specific instructions. Call your doctor if you have any problems or questions after your procedure. °HOME CARE °· Rest as told by your doctor. °· Take medicines only as told by your doctor. °· There are many different ways to close and cover the biopsy site, including stitches (sutures), skin glue, and adhesive strips. Follow instructions from your doctor about: °¨ How to take care of your biopsy site. °¨ When and how you should change your bandage (dressing). °¨ When you should remove your dressing. °¨ Removing whatever was used to close your biopsy site. °· Check your biopsy site every day for signs of infection. Watch for: °¨ Redness, swelling, or pain. °¨ Fluid, blood, or pus. °GET HELP IF: °· You have a fever. °· You have redness, swelling, or pain at the biopsy site, and it lasts longer than a few days. °· You have fluid, blood, or pus coming from the biopsy site. °· You feel sick to your stomach (nauseous). °· You throw up (vomit). °GET HELP RIGHT AWAY IF: °· You are short of breath. °· You have trouble breathing. °· Your chest hurts. °· You feel dizzy or you pass out (faint). °· You have bleeding that does not stop with pressure or a bandage. °· You cough up blood. °· Your belly (abdomen) hurts. °  °This information is not intended to replace advice given to you by your health care provider. Make sure you discuss any questions you have with your health care provider. °  °Document Released: 09/21/2008 Document Revised: 02/23/2015 Document Reviewed: 10/05/2014 °Elsevier Interactive Patient Education ©2016 [employer] °Moderate Conscious Sedation, Adult, Care After °Refer to this sheet in the next few weeks. These instructions provide you with information on caring for yourself after your procedure. Your health care provider may also give you more specific  instructions. Your treatment has been planned according to current medical practices, but problems sometimes occur. Call your health care provider if you have any problems or questions after your procedure. °WHAT TO EXPECT AFTER THE PROCEDURE  °After your procedure: °· You may feel sleepy, clumsy, and have poor balance for several hours. °· Vomiting may occur if you eat too soon after the procedure. °HOME CARE INSTRUCTIONS °· Do not participate in any activities where you could become injured for at least 24 hours. Do not: °· Drive. °· Swim. °· Ride a bicycle. °· Operate heavy machinery. °· Cook. °· Use power tools. °· Climb ladders. °· Work from a high place. °· Do not make important decisions or sign legal documents until you are improved. °· If you vomit, drink water, juice, or soup when you can drink without vomiting. Make sure you have little or no nausea before eating solid foods. °· Only take over-the-counter or prescription medicines for pain, discomfort, or fever as directed by your health care provider. °· Make sure you and your family fully understand everything about the medicines given to you, including what side effects may occur. °· You should not drink alcohol, take sleeping pills, or take medicines that cause drowsiness for at least 24 hours. °· If you smoke, do not smoke without supervision. °· If you are feeling better, you may resume normal activities 24 hours after you were sedated. °· Keep all appointments with your health care provider. °SEEK MEDICAL CARE IF: °· Your skin is pale or bluish in color. °· You   continue to feel nauseous or vomit. °· Your pain is getting worse and is not helped by medicine. °· You have bleeding or swelling. °· You are still sleepy or feeling clumsy after 24 hours. °SEEK IMMEDIATE MEDICAL CARE IF: °· You develop a rash. °· You have difficulty breathing. °· You develop any type of allergic problem. °· You have a fever. °MAKE SURE YOU: °· Understand these  instructions. °· Will watch your condition. °· Will get help right away if you are not doing well or get worse. °  °This information is not intended to replace advice given to you by your health care provider. Make sure you discuss any questions you have with your health care provider. °  °Document Released: 07/30/2013 Document Revised: 10/30/2014 Document Reviewed: 07/30/2013 °Elsevier Interactive Patient Education ©2016 Elsevier Inc. ° °

## 2016-07-28 ENCOUNTER — Telehealth: Payer: Self-pay | Admitting: Oncology

## 2016-07-28 ENCOUNTER — Ambulatory Visit (HOSPITAL_BASED_OUTPATIENT_CLINIC_OR_DEPARTMENT_OTHER): Payer: 59 | Admitting: Oncology

## 2016-07-28 VITALS — BP 154/90 | HR 79 | Temp 99.0°F | Resp 20 | Ht 69.0 in | Wt 253.1 lb

## 2016-07-28 DIAGNOSIS — R591 Generalized enlarged lymph nodes: Secondary | ICD-10-CM | POA: Diagnosis not present

## 2016-07-28 DIAGNOSIS — Z87442 Personal history of urinary calculi: Secondary | ICD-10-CM

## 2016-07-28 DIAGNOSIS — R310 Gross hematuria: Secondary | ICD-10-CM

## 2016-07-28 NOTE — Telephone Encounter (Signed)
Gv pt appts for April 2018. Advised radiology will call to make ct appt. Gv pt contrast.

## 2016-07-28 NOTE — Progress Notes (Signed)
Hematology and Oncology Follow Up Visit  LEXX ETZEL [id number] 09-Jun-1971 45 y.o. 07/28/2016 3:34 PM Laurey Morale, MDFry, Ishmael Holter, MD   Principle Diagnosis: 45 year old with left external iliac adenopathy noted on a CT scan in August 2017. He has a lymph gland measuring 2.2 cm and another one measuring 2.1. 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   Prior Therapy: He is status post ultrasound-guided biopsy done on 07/20/2016.  Current therapy: Observation and surveillance.  Interim History: Mr. Milum presents today for a follow-up visit with his wife. Since his last visit, he underwent the ultrasound-guided biopsy and tolerated it well. He denied any complications since that time. He denied any pain or discomfort. He denied any constitutional symptoms related to adenopathy. He remains active and attends to activities of daily living.  He does not report any headaches, blurry vision, syncope or seizures. He does not report any fevers or chills or sweats. He does not report any cough, wheezing or hemoptysis. He does not report any chest pain, palpitation orthopnea. He does not report any nausea, vomiting, abdominal pain or changes bowel habits. He does not report any skeletal complaints. He denied any palpable adenopathy, petechiae or bruising. Remaining review of systems unremarkable.    Medications: I have reviewed the patient's current medications.  Current Outpatient Prescriptions  Medication Sig Dispense Refill  . amLODipine (NORVASC) 10 MG tablet Take 1 tablet (10 mg total) by mouth daily. for high blood pressure 90 tablet 3  . Ascorbic Acid (VITAMIN C) 1000 MG tablet Take 1,000 mg by mouth daily.    . baclofen (LIORESAL) 10 MG tablet Take 1 tablet (10 mg total) by mouth at bedtime. 90 each 0  . Dexlansoprazole (DEXILANT) 30 MG capsule Take 1 capsule (30 mg total) by mouth daily. 90 capsule 3  . losartan-hydrochlorothiazide (HYZAAR) 100-25 MG tablet  Take 1 tablet by mouth daily. 90 tablet 3  . metoprolol succinate (TOPROL-XL) 50 MG 24 hr tablet Take 1 tablet (50 mg total) by mouth daily. Take with or immediately following a meal. 90 tablet 3  . naproxen sodium (ANAPROX) 220 MG tablet Take 220 mg by mouth 2 (two) times daily with a meal.    . potassium chloride SA (K-DUR,KLOR-CON) 20 MEQ tablet Take 1 tablet (20 mEq total) by mouth 2 (two) times daily. 180 tablet 3   No current facility-administered medications for this visit.      Allergies: No Known Allergies  Past Medical History, Surgical history, Social history, and Family History were reviewed and updated.   Physical Exam: Blood pressure (!) 154/90, pulse 79, temperature 99 F (37.2 C), temperature source Oral, resp. rate 20, height 5\' 9"  (1.753 m), weight 253 lb 1.6 oz (114.8 kg), SpO2 99 %. ECOG: 0 General appearance: alert and cooperative Head: Normocephalic, without obvious abnormality Neck: no adenopathy Lymph nodes: Cervical, supraclavicular, and axillary nodes normal. Heart:regular rate and rhythm, S1, S2 normal, no murmur, click, rub or gallop Lung:chest clear, no wheezing, rales, normal symmetric air entry Abdomin: soft, non-tender, without masses or organomegaly EXT:no erythema, induration, or nodules   Lab Results: Lab Results  Component Value Date   WBC 8.9 07/20/2016   HGB 14.6 07/20/2016   HCT 40.5 07/20/2016   MCV 86.2 07/20/2016   PLT 215 07/20/2016     Chemistry      Component Value Date/Time   NA 141 01/27/2016 1352   K 3.3 (L) 01/27/2016 1352   CL 102 01/27/2016 1352  CO2 31 01/27/2016 1352   BUN 9 01/27/2016 1352   CREATININE 1.04 01/27/2016 1352      Component Value Date/Time   CALCIUM 9.5 01/27/2016 1352   ALKPHOS 68 08/10/2015 0856   AST 24 08/10/2015 0856   ALT 28 08/10/2015 0856   BILITOT 0.5 08/10/2015 0856       Impression and Plan:  45 year old gentleman with the following issues:  1. Left external iliac and left  inguinal adenopathy noted on the CT scan incidentally in August 2017. His lymph nodes measuring around 2.2 and 2.1 cm.  He is status post ultrasound-guided biopsy on 07/20/2016. The pathology was discussed with the patient and his wife today and showed no evidence of malignancy. His findings are suggestive of hyperplasia related to reactive process. The possibility of a false-negative is less likely but for completeness I will repeat CT scan in 6 months to ensure resolution of these lymphadenopathy. If his CT scan in 6 months showed resolution or near resolution of this lymphadenopathy, no further workup will be needed.  2. Follow-up: Be in 6 months after repeat CT scan.    Zola Button, MD 10/6/20173:34 PM

## 2016-08-14 ENCOUNTER — Other Ambulatory Visit: Payer: Self-pay | Admitting: Family Medicine

## 2016-08-14 ENCOUNTER — Other Ambulatory Visit: Payer: Self-pay | Admitting: Gastroenterology

## 2016-08-15 NOTE — Telephone Encounter (Signed)
Yes can refill it if it works for him, using it for nocturnal GERD. Thanks

## 2016-08-15 NOTE — Telephone Encounter (Signed)
Pt is requesting 90 day supply of Baclofen. Refill?

## 2016-11-07 ENCOUNTER — Ambulatory Visit (INDEPENDENT_AMBULATORY_CARE_PROVIDER_SITE_OTHER): Payer: 59 | Admitting: Family Medicine

## 2016-11-07 ENCOUNTER — Encounter: Payer: Self-pay | Admitting: Family Medicine

## 2016-11-07 VITALS — BP 138/88 | Temp 98.5°F | Ht 69.0 in | Wt 257.0 lb

## 2016-11-07 DIAGNOSIS — Z23 Encounter for immunization: Secondary | ICD-10-CM

## 2016-11-07 DIAGNOSIS — Z833 Family history of diabetes mellitus: Secondary | ICD-10-CM

## 2016-11-07 DIAGNOSIS — R5383 Other fatigue: Secondary | ICD-10-CM | POA: Diagnosis not present

## 2016-11-07 LAB — CBC WITH DIFFERENTIAL/PLATELET
Basophils Absolute: 0 10*3/uL (ref 0.0–0.1)
Basophils Relative: 0.5 % (ref 0.0–3.0)
Eosinophils Absolute: 0.3 10*3/uL (ref 0.0–0.7)
Eosinophils Relative: 3.5 % (ref 0.0–5.0)
HCT: 41.3 % (ref 39.0–52.0)
Hemoglobin: 14.5 g/dL (ref 13.0–17.0)
Lymphocytes Relative: 41.6 % (ref 12.0–46.0)
Lymphs Abs: 3.3 10*3/uL (ref 0.7–4.0)
MCHC: 35 g/dL (ref 30.0–36.0)
MCV: 91.3 fl (ref 78.0–100.0)
Monocytes Absolute: 1 10*3/uL (ref 0.1–1.0)
Monocytes Relative: 13 % — ABNORMAL HIGH (ref 3.0–12.0)
Neutro Abs: 3.3 10*3/uL (ref 1.4–7.7)
Neutrophils Relative %: 41.4 % — ABNORMAL LOW (ref 43.0–77.0)
Platelets: 212 10*3/uL (ref 150.0–400.0)
RBC: 4.52 Mil/uL (ref 4.22–5.81)
RDW: 13.1 % (ref 11.5–15.5)
WBC: 8 10*3/uL (ref 4.0–10.5)

## 2016-11-07 LAB — POC URINALSYSI DIPSTICK (AUTOMATED)
Bilirubin, UA: NEGATIVE
Blood, UA: NEGATIVE
Glucose, UA: NEGATIVE
Ketones, UA: NEGATIVE
Leukocytes, UA: NEGATIVE
Nitrite, UA: NEGATIVE
Protein, UA: NEGATIVE
Spec Grav, UA: 1.015
Urobilinogen, UA: 0.2
pH, UA: 7

## 2016-11-07 LAB — BASIC METABOLIC PANEL
BUN: 7 mg/dL (ref 6–23)
CO2: 30 mEq/L (ref 19–32)
Calcium: 9.3 mg/dL (ref 8.4–10.5)
Chloride: 103 mEq/L (ref 96–112)
Creatinine, Ser: 1.02 mg/dL (ref 0.40–1.50)
GFR: 101.19 mL/min (ref >60.00)
Glucose, Bld: 85 mg/dL (ref 70–99)
Potassium: 3 mEq/L — ABNORMAL LOW (ref 3.5–5.1)
Sodium: 140 mEq/L (ref 135–145)

## 2016-11-07 LAB — LDL CHOLESTEROL, DIRECT: Direct LDL: 74 mg/dL

## 2016-11-07 LAB — LIPID PANEL
Cholesterol: 162 mg/dL (ref 0–200)
HDL: 25.8 mg/dL — ABNORMAL LOW (ref >39.00)
NonHDL: 136.15
Total CHOL/HDL Ratio: 6
Triglycerides: 317 mg/dL — ABNORMAL HIGH (ref 0.0–149.0)
VLDL: 63.4 mg/dL — ABNORMAL HIGH (ref 0.0–40.0)

## 2016-11-07 LAB — HEPATIC FUNCTION PANEL
ALT: 33 U/L (ref 0–53)
AST: 29 U/L (ref 0–37)
Albumin: 4.1 g/dL (ref 3.5–5.2)
Alkaline Phosphatase: 62 U/L (ref 39–117)
Bilirubin, Direct: 0.1 mg/dL (ref 0.0–0.3)
Total Bilirubin: 0.5 mg/dL (ref 0.2–1.2)
Total Protein: 7.2 g/dL (ref 6.0–8.3)

## 2016-11-07 LAB — HEMOGLOBIN A1C: Hgb A1c MFr Bld: 5.4 % (ref 4.6–6.5)

## 2016-11-07 LAB — TSH: TSH: 1.17 u[IU]/mL (ref 0.35–4.50)

## 2016-11-07 NOTE — Progress Notes (Signed)
Pre visit review using our clinic review tool, if applicable. No additional management support is needed unless otherwise documented below in the visit note. 

## 2016-11-07 NOTE — Progress Notes (Signed)
   Subjective:    Patient ID: Andrew Sharp, male    DOB: 06/25/1971, 47 y.o.   MRN: [id number]  HPI Here with concerns about a 2 week history of feeling very tired all the time and being more thirsty than usual. He has a strong family history of diabetes (both parents, a brother, and a grandfather) and he is worried he may have it now as well. No other symptoms.    Review of Systems  Constitutional: Positive for fatigue. Negative for fever and unexpected weight change.  Respiratory: Negative.   Cardiovascular: Negative.   Gastrointestinal: Negative.   Endocrine: Positive for polydipsia.  Neurological: Negative.        Objective:   Physical Exam  Constitutional: He is oriented to person, place, and time. He appears well-developed and well-nourished.  Neck: Neck supple. No thyromegaly present.  Cardiovascular: Normal rate, regular rhythm, normal heart sounds and intact distal pulses.   Pulmonary/Chest: Effort normal and breath sounds normal.  Lymphadenopathy:    He has no cervical adenopathy.  Neurological: He is alert and oriented to person, place, and time.          Assessment & Plan:  Fatigue and excessive thirst, possible diabetes. Screen with labs today including an A1c.  Alysia Penna, MD

## 2016-12-21 ENCOUNTER — Telehealth: Payer: Self-pay | Admitting: Oncology

## 2016-12-21 NOTE — Telephone Encounter (Signed)
Called and confirmed both appointments with patients

## 2017-01-15 ENCOUNTER — Other Ambulatory Visit: Payer: Self-pay | Admitting: Gastroenterology

## 2017-01-15 ENCOUNTER — Other Ambulatory Visit: Payer: Self-pay | Admitting: Family Medicine

## 2017-01-26 ENCOUNTER — Other Ambulatory Visit (HOSPITAL_BASED_OUTPATIENT_CLINIC_OR_DEPARTMENT_OTHER): Payer: 59

## 2017-01-26 DIAGNOSIS — Z87442 Personal history of urinary calculi: Secondary | ICD-10-CM

## 2017-01-26 DIAGNOSIS — R591 Generalized enlarged lymph nodes: Secondary | ICD-10-CM

## 2017-01-26 DIAGNOSIS — R310 Gross hematuria: Secondary | ICD-10-CM

## 2017-01-26 LAB — CBC WITH DIFFERENTIAL/PLATELET
BASO%: 0.5 % (ref 0.0–2.0)
Basophils Absolute: 0 10*3/uL (ref 0.0–0.1)
EOS%: 3.8 % (ref 0.0–7.0)
Eosinophils Absolute: 0.3 10*3/uL (ref 0.0–0.5)
HCT: 42.1 % (ref 38.4–49.9)
HGB: 14.9 g/dL (ref 13.0–17.1)
LYMPH%: 41.5 % (ref 14.0–49.0)
MCH: 31.5 pg (ref 27.2–33.4)
MCHC: 35.3 g/dL (ref 32.0–36.0)
MCV: 89.3 fL (ref 79.3–98.0)
MONO#: 1.1 10*3/uL — ABNORMAL HIGH (ref 0.1–0.9)
MONO%: 14.5 % — ABNORMAL HIGH (ref 0.0–14.0)
NEUT#: 3 10*3/uL (ref 1.5–6.5)
NEUT%: 39.7 % (ref 39.0–75.0)
Platelets: 194 10*3/uL (ref 140–400)
RBC: 4.72 10*6/uL (ref 4.20–5.82)
RDW: 12.9 % (ref 11.0–14.6)
WBC: 7.5 10*3/uL (ref 4.0–10.3)
lymph#: 3.1 10*3/uL (ref 0.9–3.3)

## 2017-01-26 LAB — COMPREHENSIVE METABOLIC PANEL
ALT: 32 U/L (ref 0–55)
AST: 26 U/L (ref 5–34)
Albumin: 4 g/dL (ref 3.5–5.0)
Alkaline Phosphatase: 66 U/L (ref 40–150)
Anion Gap: 8 mEq/L (ref 3–11)
BUN: 10 mg/dL (ref 7.0–26.0)
CO2: 28 mEq/L (ref 22–29)
Calcium: 9.1 mg/dL (ref 8.4–10.4)
Chloride: 107 mEq/L (ref 98–109)
Creatinine: 1 mg/dL (ref 0.7–1.3)
EGFR: >90 mL/min/{1.73_m2} (ref >90)
Glucose: 115 mg/dl (ref 70–140)
Potassium: 3.3 mEq/L — ABNORMAL LOW (ref 3.5–5.1)
Sodium: 142 mEq/L (ref 136–145)
Total Bilirubin: 0.45 mg/dL (ref 0.20–1.20)
Total Protein: 7.3 g/dL (ref 6.4–8.3)

## 2017-01-30 ENCOUNTER — Ambulatory Visit (HOSPITAL_COMMUNITY)
Admission: RE | Admit: 2017-01-30 | Discharge: 2017-01-30 | Disposition: A | Discharge status: Home / Self Care | Payer: 59 | Source: Ambulatory Visit | Attending: Oncology | Admitting: Oncology

## 2017-01-30 ENCOUNTER — Encounter (HOSPITAL_COMMUNITY): Payer: Self-pay

## 2017-01-30 DIAGNOSIS — R591 Generalized enlarged lymph nodes: Secondary | ICD-10-CM | POA: Insufficient documentation

## 2017-01-30 DIAGNOSIS — R310 Gross hematuria: Secondary | ICD-10-CM | POA: Insufficient documentation

## 2017-01-30 DIAGNOSIS — R599 Enlarged lymph nodes, unspecified: Secondary | ICD-10-CM | POA: Diagnosis not present

## 2017-01-30 DIAGNOSIS — Z87442 Personal history of urinary calculi: Secondary | ICD-10-CM

## 2017-01-30 MED ORDER — IOPAMIDOL (ISOVUE-300) INJECTION 61%
100.0000 mL | Freq: Once | INTRAVENOUS | Status: AC | PRN
  Administered 2017-01-30: 100 mL via INTRAVENOUS

## 2017-01-30 MED ORDER — IOPAMIDOL (ISOVUE-300) INJECTION 61%
INTRAVENOUS | Status: AC
  Filled 2017-01-30: qty 100

## 2017-02-02 ENCOUNTER — Ambulatory Visit (HOSPITAL_BASED_OUTPATIENT_CLINIC_OR_DEPARTMENT_OTHER): Payer: 59 | Admitting: Oncology

## 2017-02-02 VITALS — BP 175/98 | HR 90 | Temp 98.2°F | Resp 18 | Ht 69.0 in | Wt 263.0 lb

## 2017-02-02 DIAGNOSIS — R591 Generalized enlarged lymph nodes: Secondary | ICD-10-CM | POA: Diagnosis not present

## 2017-02-02 NOTE — Progress Notes (Signed)
Hematology and Oncology Follow Up Visit  Andrew Sharp [id number] 1970/10/24 46 y.o. 02/02/2017 12:15 PM Andrew Sharp, MDFry, Ishmael Holter, MD   Principle Diagnosis: 46 year old with left external iliac adenopathy noted on a CT scan in August 2017. He has a lymph gland measuring 2.2 cm and another one measuring 2.1. 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   Prior Therapy: He is status post ultrasound-guided biopsy done on 07/20/2016.  Current therapy: Observation and surveillance.  Interim History: [name] presents today for a follow-up visit with his wife. Since his last visit, he reports no recent complaints or changes in his health. He continues to work full-time without any recent hospitalizations or illnesses. He denies abdominal distention or change in his bowel habits. He no longer reports any hematuria or dysuria.   He does not report any headaches, blurry vision, syncope or seizures. He does not report any fevers or chills or sweats. He does not report any cough, wheezing or hemoptysis. He does not report any chest pain, palpitation orthopnea. He does not report any nausea, vomiting, abdominal pain or changes bowel habits. He does not report any skeletal complaints. He denied any palpable adenopathy, petechiae or bruising. Remaining review of systems unremarkable.    Medications: I have reviewed the patient's current medications.  Current Outpatient Prescriptions  Medication Sig Dispense Refill  . amLODipine (NORVASC) 10 MG tablet take 1 tablet by mouth once daily for high blood pressure 90 tablet 3  . Ascorbic Acid (VITAMIN C) 1000 MG tablet Take 1,000 mg by mouth daily.    . baclofen (LIORESAL) 10 MG tablet take 1 tablet by mouth at bedtime 90 tablet 0  . DEXILANT 30 MG capsule take 1 capsule by mouth once daily 90 capsule 2  . losartan-hydrochlorothiazide (HYZAAR) 100-25 MG tablet take 1 tablet by mouth once daily 90 tablet 3  . metoprolol  succinate (TOPROL-XL) 50 MG 24 hr tablet TAKE 1 TABLET BY MOUTH DAILY IMMEDIATELY FOLLOWING A MEAL 90 tablet 1  . naproxen sodium (ANAPROX) 220 MG tablet Take 220 mg by mouth 2 (two) times daily with a meal.    . potassium chloride SA (K-DUR,KLOR-CON) 20 MEQ tablet Take 1 tablet (20 mEq total) by mouth 2 (two) times daily. (Patient taking differently: Take 20 mEq by mouth daily. ) 180 tablet 3   No current facility-administered medications for this visit.      Allergies: No Known Allergies  Past Medical History, Surgical history, Social history, and Family History were reviewed and updated.   Physical Exam: Blood pressure (!) 175/98, pulse 90, temperature 98.2 F (36.8 C), temperature source Oral, resp. rate 18, height 5\' 9"  (1.753 m), weight 263 lb (119.3 kg), SpO2 97 %. ECOG: 0 General appearance: alert and cooperative. Without distress. Head: Normocephalic, without obvious abnormality no oral ulcers or lesions. Neck: no adenopathy Lymph nodes: Cervical, supraclavicular, and axillary nodes normal. Heart:regular rate and rhythm, S1, S2 normal, no murmur, click, rub or gallop Lung:chest clear, no wheezing, rales, normal symmetric air entry Abdomin: soft, non-tender, without masses or organomegaly no shifting dullness or ascites. EXT:no erythema, induration, or nodules   Lab Results: Lab Results  Component Value Date   WBC 7.5 01/26/2017   HGB 14.9 01/26/2017   HCT 42.1 01/26/2017   MCV 89.3 01/26/2017   PLT 194 01/26/2017     Chemistry      Component Value Date/Time   NA 142 01/26/2017 0852   K 3.3 (L) 01/26/2017 6568  CL 103 11/07/2016 1515   CO2 28 01/26/2017 0852   BUN 10.0 01/26/2017 0852   CREATININE 1.0 01/26/2017 0852      Component Value Date/Time   CALCIUM 9.1 01/26/2017 0852   ALKPHOS 66 01/26/2017 0852   AST 26 01/26/2017 0852   ALT 32 01/26/2017 0852   BILITOT 0.45 01/26/2017 0852     EXAM: CT ABDOMEN AND PELVIS WITH  CONTRAST  TECHNIQUE: Multidetector CT imaging of the abdomen and pelvis was performed using the standard protocol following bolus administration of intravenous contrast.  CONTRAST:  12mL ISOVUE-300 IOPAMIDOL (ISOVUE-300) INJECTION 61%  COMPARISON:  06/12/2016  FINDINGS: Lower Chest: No acute findings.  Hepatobiliary:  No masses identified. Gallbladder is unremarkable.  Pancreas:  No mass or inflammatory changes.  Spleen: Within normal limits in size and appearance.  Adrenals/Urinary Tract: No masses identified. No evidence of hydronephrosis. Unremarkable unopacified urinary bladder.  Stomach/Bowel: No evidence of obstruction, inflammatory process or abnormal fluid collections. A focal area of fat necrosis is seen in the pericolonic fat which stable. No evidence of colitis. Normal appendix visualized.  Vascular/Lymphatic: Mild pelvic lymphadenopathy is again seen in the left external iliac chain and left inguinal region, without significant change. Largest lymph node in the left inguinal region measures 2.0 cm on image 82/ 2 compared to 2.1 cm previously. No new or increased areas of lymphadenopathy identified. No abdominal aortic aneurysm.  Reproductive:  No mass or other significant abnormality.  Other:  None.  Musculoskeletal:  No suspicious bone lesions identified.  IMPRESSION: Stable mild left external iliac and inguinal lymphadenopathy. No new or increased sites of lymphadenopathy identified.  Impression and Plan:  46 year old gentleman with the following issues:  1. Left external iliac and left inguinal adenopathy noted on the CT scan incidentally in August 2017. His lymph nodes measuring around 2.2 and 2.1 cm.  He is status post ultrasound-guided biopsy on 07/20/2016. The pathology showed no evidence of malignancy. His findings are suggestive of hyperplasia related to reactive process.   Repeat CT scan on 01/30/2017 was reviewed today and  showed no major changes in this lymphadenopathy which indicates benign etiology. I see no need for further investigation at this time including repeat biopsy or imaging studies.  2. Follow-up: As needed.    Zola Button, MD 4/13/201812:15 PM

## 2017-02-16 ENCOUNTER — Telehealth: Payer: Self-pay | Admitting: Family Medicine

## 2017-02-16 MED ORDER — POTASSIUM CHLORIDE CRYS ER 20 MEQ PO TBCR: 20.0000 meq | EXTENDED_RELEASE_TABLET | Freq: Two times a day (BID) | ORAL | 3 refills | Status: DC

## 2017-02-16 NOTE — Telephone Encounter (Signed)
Refill this for one year  

## 2017-02-16 NOTE — Telephone Encounter (Signed)
Refill request for Potassium ER 20 meq take 1 po bid and send to [facility].

## 2017-02-16 NOTE — Telephone Encounter (Signed)
I sent script e-scribe to [facility] and for a 90 day supply with refills.

## 2017-02-16 NOTE — Telephone Encounter (Signed)
Can we refill this? 

## 2017-03-05 ENCOUNTER — Encounter: Payer: Self-pay | Admitting: Family Medicine

## 2017-03-05 ENCOUNTER — Ambulatory Visit (INDEPENDENT_AMBULATORY_CARE_PROVIDER_SITE_OTHER): Payer: 59 | Admitting: Family Medicine

## 2017-03-05 VITALS — BP 118/86 | HR 70 | Temp 98.8°F | Ht 69.0 in | Wt 258.4 lb

## 2017-03-05 DIAGNOSIS — R319 Hematuria, unspecified: Secondary | ICD-10-CM

## 2017-03-05 DIAGNOSIS — R310 Gross hematuria: Secondary | ICD-10-CM

## 2017-03-05 LAB — POC URINALSYSI DIPSTICK (AUTOMATED)
Bilirubin, UA: NEGATIVE
Glucose, UA: NEGATIVE
Ketones, UA: NEGATIVE
Leukocytes, UA: NEGATIVE
Nitrite, UA: NEGATIVE
Protein, UA: NEGATIVE
Spec Grav, UA: 1.02 (ref 1.010–1.025)
Urobilinogen, UA: 1 E.U./dL
pH, UA: 6 (ref 5.0–8.0)

## 2017-03-05 NOTE — Patient Instructions (Signed)
Blood pressure better with larger cuff- just something to consider for future checks. BP Readings from Last 3 Encounters:  03/05/17 118/86  02/02/17 (!) 175/98  11/07/16 138/88    We will call you within a week or two about your referral to urology (may take a few weeks to get in). If you do not hear within 3 weeks, give [place] a call.   We are moving this up from august date due to new blood in urine. We are also going to get a culture to make sure no infection

## 2017-03-05 NOTE — Progress Notes (Signed)
Subjective:  Andrew Sharp is a 46 y.o. year old very pleasant male patient who presents for/with See problem oriented charting ROS- no fever, chills, nausea, vomiting, slight right low back pain   Past Medical History-  Patient Active Problem List   Diagnosis Date Noted  . Gross hematuria 01/27/2016  . History of esophageal stricture 03/28/2015  . Regurgitation 03/28/2015  . Stricture and stenosis of esophagus 04/29/2012  . Essential hypertension 09/21/2009  . RECTAL BLEEDING 04/15/2008  . HEMORRHOIDS 04/14/2008  . GERD 03/10/2008  . HEART MURMUR, HX OF 03/10/2008  . NEPHROLITHIASIS, HX OF 03/10/2008    Medications- reviewed and updated Current Outpatient Prescriptions  Medication Sig Dispense Refill  . amLODipine (NORVASC) 10 MG tablet take 1 tablet by mouth once daily for high blood pressure 90 tablet 3  . Ascorbic Acid (VITAMIN C) 1000 MG tablet Take 1,000 mg by mouth daily.    . baclofen (LIORESAL) 10 MG tablet take 1 tablet by mouth at bedtime 90 tablet 0  . DEXILANT 30 MG capsule take 1 capsule by mouth once daily 90 capsule 2  . losartan-hydrochlorothiazide (HYZAAR) 100-25 MG tablet take 1 tablet by mouth once daily 90 tablet 3  . metoprolol succinate (TOPROL-XL) 50 MG 24 hr tablet TAKE 1 TABLET BY MOUTH DAILY IMMEDIATELY FOLLOWING A MEAL 90 tablet 1  . naproxen sodium (ANAPROX) 220 MG tablet Take 220 mg by mouth 2 (two) times daily with a meal.    . potassium chloride SA (K-DUR,KLOR-CON) 20 MEQ tablet Take 1 tablet (20 mEq total) by mouth 2 (two) times daily. 180 tablet 3   No current facility-administered medications for this visit.     Objective: BP 140/90 (BP Location: Left Arm, Patient Position: Sitting, Cuff Size: Large)   Pulse 70   Temp 98.8 F (37.1 C) (Oral)   Ht 5\' 9"  (1.753 m)   Wt 258 lb 6.4 oz (117.2 kg)   SpO2 94%   BMI 38.16 kg/m  Gen: NAD, resting comfortably CV: RRR no murmurs rubs or gallops Lungs: CTAB no crackles, wheeze, rhonchi No  significant CVA tenderness Ext: no edema Skin: warm, dry  Results for orders placed or performed in visit on 03/05/17 (from the past 24 hour(s))  POCT Urinalysis Dipstick (Automated)     Status: None   Collection Time: 03/05/17  2:23 PM  Result Value Ref Range   Color, UA dark yellow    Clarity, UA clear    Glucose, UA N    Bilirubin, UA N    Ketones, UA N    Spec Grav, UA 1.020 1.010 - 1.025   Blood, UA 1+    pH, UA 6.0 5.0 - 8.0   Protein, UA N    Urobilinogen, UA 1.0 0.2 or 1.0 E.U./dL   Nitrite, UA N    Leukocytes, UA Negative Negative  Assessment/Plan:  Gross hematuria - Plan: POCT Urinalysis Dipstick (Automated), Urine culture S: Blood in the urine since Friday. Started heavy and has lightened up some now.   Had similar symptoms about a year ago- went to urology at that time. States "had a bunch of tests done".  Did Ct scan, cystoscopy- only potential cause was kidney stones. Mild right sided pain. Has had kidney stones pass before and cause unbearable pain and this pain is very minor. Also had gross hematuria a few months ago.  CT scan last month for lymphadenopathy follow up- no stones identified at that time.  Does have some mild burning with first  part of stream and that's when the blood comes out.   No blood in semen A/P: get urine culture to rule out infection (does have some mild dysuria)- though doubt. Refer back to urology- appear plan was august but with new hematuria now 2x in last 4 months- he would like their opinion. No obvious kidney stone on last CT but urology will review film as well - likely repeat KUB. Mild right low back pain- if stone was moving would suspect more significant pain.   Orders Placed This Encounter  Procedures  . Urine culture  . Ambulatory referral to Urology    Referral Priority:   Routine    Referral Type:   Consultation    Referral Reason:   Specialty Services Required    Requested Specialty:   Urology    Number of Visits  Requested:   1  . POCT Urinalysis Dipstick (Automated)   Return precautions advised specifically fever, worsening pain.  Garret Reddish, MD

## 2017-03-06 LAB — URINE CULTURE: Organism ID, Bacteria: NO GROWTH

## 2017-03-19 ENCOUNTER — Other Ambulatory Visit: Payer: Self-pay | Admitting: Gastroenterology

## 2017-04-06 ENCOUNTER — Ambulatory Visit (INDEPENDENT_AMBULATORY_CARE_PROVIDER_SITE_OTHER): Payer: 59 | Admitting: Physician Assistant

## 2017-04-06 ENCOUNTER — Encounter: Payer: Self-pay | Admitting: Physician Assistant

## 2017-04-06 VITALS — BP 146/84 | HR 82 | Ht 69.0 in | Wt 259.0 lb

## 2017-04-06 DIAGNOSIS — K219 Gastro-esophageal reflux disease without esophagitis: Secondary | ICD-10-CM | POA: Diagnosis not present

## 2017-04-06 MED ORDER — BACLOFEN 10 MG PO TABS: 10.0000 mg | ORAL_TABLET | ORAL | 3 refills | Status: DC

## 2017-04-06 NOTE — Patient Instructions (Signed)
If you are age 46 or older, your body mass index should be between 23-30. Your Body mass index is 38.25 kg/m. If this is out of the aforementioned range listed, please consider follow up with your Primary Care Provider.  If you are age 97 or younger, your body mass index should be between 19-25. Your Body mass index is 38.25 kg/m. If this is out of the aformentioned range listed, please consider follow up with your Primary Care Provider.   We have sent the following medications to your pharmacy for you to pick up at your convenience: Dexilant Baclofen   Follow up with Dr Havery Moros as needed in one year.    Thank you for choosing me and [facility].   Ellouise Newer, PA-C

## 2017-04-06 NOTE — Progress Notes (Signed)
Chief Complaint: GERD  HPI:  Andrew Sharp is a 46 year old African-American male with past medical history of esophageal stricture and reflux, who returns to clinic today for follow-up of his GERD and refill of his medications.    Patient was last seen in clinic by Dr. Havery Moros on 01/18/16 and at that time it was discussed that he had long-standing reflux with nocturnal symptoms of regurgitation and pyrosis. He was taking Dexilant 30 mg twice a day at that time after having tried multiple other medications. His last EGD was performed 05/26/15 and was normal. At that time, patient was started on Baclofen to try to help with nocturnal reflux symptoms. He was also reduced to Dexilant 30 mg once daily. Patient was also arranged for a colonoscopy for screening purposes.   Colonoscopy performed 03/27/16 showed a 4 mm polyp in the cecum, diverticulosis and nonbleeding internal hemorrhoids. It was recommended that the patient have repeat in 5 years.   Today, the patient tells me that his reflux is moderately under control. He tells me that he is still taking his Dexilant 30 mg once in the morning. He is using his Baclofen 10 mg at night and tells me that this has decreased his nighttime reflux symptoms. In fact, he is now only having one occurrence of week, compared to 3 or 4 previously. He would like to continue these medications.   Patient denies fever, chills, blood in the stool, melena, weight loss, fatigue, anorexia, nausea, vomiting, dysphagia or abdominal pain.  Past Medical History:  Diagnosis Date  . Esophageal stricture   . GERD (gastroesophageal reflux disease)   . History of kidney stones   . Hypertension     Past Surgical History:  Procedure Laterality Date  . COLONOSCOPY  03-16-06   per Dr. Deatra Ina, nonspecific colitis and int. hemorrhoids  . CT CORONARY CA SCORING  09-19-11   for chest pains, negative   . KNEE ARTHROPLASTY Left     Current Outpatient Prescriptions  Medication Sig  Dispense Refill  . amLODipine (NORVASC) 10 MG tablet take 1 tablet by mouth once daily for high blood pressure 90 tablet 3  . baclofen (LIORESAL) 10 MG tablet Take 1 tablet (10 mg total) by mouth every morning. 90 tablet 3  . DEXILANT 30 MG capsule take 1 capsule by mouth once daily 90 capsule 2  . losartan-hydrochlorothiazide (HYZAAR) 100-25 MG tablet take 1 tablet by mouth once daily 90 tablet 3  . metoprolol succinate (TOPROL-XL) 50 MG 24 hr tablet TAKE 1 TABLET BY MOUTH DAILY IMMEDIATELY FOLLOWING A MEAL 90 tablet 1  . naproxen sodium (ANAPROX) 220 MG tablet Take 220 mg by mouth 2 (two) times daily with a meal.    . potassium chloride SA (K-DUR,KLOR-CON) 20 MEQ tablet Take 1 tablet (20 mEq total) by mouth 2 (two) times daily. 180 tablet 3   No current facility-administered medications for this visit.     Allergies as of 04/06/2017  . (No Known Allergies)    Family History  Problem Relation Age of Onset  . Colon cancer Maternal Aunt   . Pancreatic cancer Maternal Grandmother   . Crohn's disease Other        niece  . Diabetes Brother        MOTHER,AUNT  . Heart disease Mother   . Kidney disease Mother   . Prostate cancer Maternal Grandfather   . Esophageal cancer Neg Hx   . Rectal cancer Neg Hx   . Stomach cancer Neg Hx  Social History   Social History  . Marital status: Married    Spouse name: N/A  . Number of children: 1  . Years of education: N/A   Occupational History  . [employer]    Social History Main Topics  . Smoking status: Never Smoker  . Smokeless tobacco: Never Used  . Alcohol use No  . Drug use: No  . Sexual activity: Not on file   Other Topics Concern  . Not on file   Social History Narrative  . No narrative on file    Review of Systems:    Constitutional: No weight loss, fever or chills Cardiovascular: No chest pain Respiratory: No SOB  Gastrointestinal: See HPI and otherwise negative   Physical Exam:  Vital  signs: BP (!) 146/84 (BP Location: Left Arm, Patient Position: Sitting, Cuff Size: Normal)   Pulse 82   Ht 5\' 9"  (1.753 m)   Wt 259 lb (117.5 kg)   BMI 38.25 kg/m   Constitutional:   Pleasant African American male appears to be in NAD, Well developed, Well nourished, alert and cooperative Respiratory: Respirations even and unlabored. Lungs clear to auscultation bilaterally.   No wheezes, crackles, or rhonchi.  Cardiovascular: Normal S1, S2. No MRG. Regular rate and rhythm. No peripheral edema, cyanosis or pallor.  Gastrointestinal:  Soft, nondistended, nontender. No rebound or guarding. Normal bowel sounds. No appreciable masses or hepatomegaly. Psychiatric: Demonstrates good judgement and reason without abnormal affect or behaviors.  Most recent LABS AND IMAGING: CBC    Component Value Date/Time   WBC 7.5 01/26/2017 0852   WBC 8.0 11/07/2016 1515   RBC 4.72 01/26/2017 0852   RBC 4.52 11/07/2016 1515   HGB 14.9 01/26/2017 0852   HCT 42.1 01/26/2017 0852   PLT 194 01/26/2017 0852   MCV 89.3 01/26/2017 0852   MCH 31.5 01/26/2017 0852   MCH 31.1 07/20/2016 1140   MCHC 35.3 01/26/2017 0852   MCHC 35.0 11/07/2016 1515   RDW 12.9 01/26/2017 0852   LYMPHSABS 3.1 01/26/2017 0852   MONOABS 1.1 (H) 01/26/2017 0852   EOSABS 0.3 01/26/2017 0852   BASOSABS 0.0 01/26/2017 0852    CMP     Component Value Date/Time   NA 142 01/26/2017 0852   K 3.3 (L) 01/26/2017 0852   CL 103 11/07/2016 1515   CO2 28 01/26/2017 0852   GLUCOSE 115 01/26/2017 0852   BUN 10.0 01/26/2017 0852   CREATININE 1.0 01/26/2017 0852   CALCIUM 9.1 01/26/2017 0852   PROT 7.3 01/26/2017 0852   ALBUMIN 4.0 01/26/2017 0852   AST 26 01/26/2017 0852   ALT 32 01/26/2017 0852   ALKPHOS 66 01/26/2017 0852   BILITOT 0.45 01/26/2017 0852   GFRNONAA >90 09/19/2011 1125   GFRAA >90 09/19/2011 1125    Assessment: 1. GERD: Patient has long-standing reflux which is now under control with Dexialnt 30 mg once daily as  well as Baclofen 10 mg at night for nighttime symptoms, patient is here for refills  Plan: 1. Refilled patient's Dexilant 30 mg once daily #90 with 3 refills 2. Refilled patient's Baclofen 10 mg daily at bedtime #90 with 3 refills 3. Patient to return to clinic in 1 year for follow-up with Dr. Havery Moros or myself, or before then if necessary  Ellouise Newer, PA-C [facility] 04/06/2017, 11:36 AM  Cc: Laurey Morale, MD

## 2017-04-06 NOTE — Progress Notes (Signed)
Agree with assessment and plan as outlined.  

## 2017-04-09 ENCOUNTER — Telehealth: Payer: Self-pay

## 2017-04-09 NOTE — Telephone Encounter (Signed)
Called pt to advise him to take Baclofen in the evening NOT in the morning as prescription was sent. Also, to take his Dexilant in the morning.  Patient confirmed instructions.

## 2017-05-02 ENCOUNTER — Encounter: Payer: Self-pay | Admitting: Family Medicine

## 2017-05-02 ENCOUNTER — Ambulatory Visit (INDEPENDENT_AMBULATORY_CARE_PROVIDER_SITE_OTHER): Payer: 59 | Admitting: Family Medicine

## 2017-05-02 DIAGNOSIS — F418 Other specified anxiety disorders: Secondary | ICD-10-CM | POA: Diagnosis not present

## 2017-05-02 NOTE — Progress Notes (Signed)
   Subjective:    Patient ID: Andrew Sharp, male    DOB: [birth year], 46 y.o.   MRN: [id number]  HPI Here for one month of what he terms "anxiety and depression". He has never felt this way before. He describes feeling anxoius and tense, and of worrying about things. He also describes feeling sad, hopeless, and unmotivated. His appetite is decreased and he has trouble sleeping. He denies anything going on in his life out of the ordinary. 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    Review of Systems  Constitutional: Negative.   Respiratory: Negative.   Cardiovascular: Negative.   Neurological: Negative.   Psychiatric/Behavioral: Positive for decreased concentration, dysphoric mood and sleep disturbance. Negative for agitation, confusion, hallucinations and suicidal ideas. The patient is nervous/anxious. The patient is not hyperactive.        Objective:   Physical Exam  Constitutional: He is oriented to person, place, and time. He appears well-developed and well-nourished.  Cardiovascular: Normal rate, regular rhythm, normal heart sounds and intact distal pulses.   Pulmonary/Chest: Effort normal and breath sounds normal. No respiratory distress. He has no wheezes. He has no rales.  Neurological: He is alert and oriented to person, place, and time.  Psychiatric: He has a normal mood and affect. His behavior is normal. Judgment and thought content normal.          Assessment & Plan:  Depression with anxiety. We discussed the nature of these problems and how to treat them. I mentioned psychotherapy as an option but he is not interested in that at this time. He will start on Lexapro 10 mg daily and he will follow up with [place] in 2 weeks. We spent 45 minutes together discussing these issues.  Alysia Penna, MD

## 2017-05-24 ENCOUNTER — Encounter: Payer: Self-pay | Admitting: Family Medicine

## 2017-05-24 ENCOUNTER — Ambulatory Visit (INDEPENDENT_AMBULATORY_CARE_PROVIDER_SITE_OTHER): Payer: 59 | Admitting: Family Medicine

## 2017-05-24 VITALS — BP 136/88 | Temp 99.0°F | Ht 69.0 in | Wt 250.0 lb

## 2017-05-24 DIAGNOSIS — F418 Other specified anxiety disorders: Secondary | ICD-10-CM | POA: Diagnosis not present

## 2017-05-24 MED ORDER — BUPROPION HCL ER (XL) 150 MG PO TB24: 150.0000 mg | ORAL_TABLET | Freq: Every day | ORAL | 2 refills | Status: DC

## 2017-05-24 MED ORDER — LORAZEPAM 0.5 MG PO TABS: 0.5000 mg | ORAL_TABLET | Freq: Three times a day (TID) | ORAL | 0 refills | Status: DC | PRN

## 2017-05-24 NOTE — Progress Notes (Signed)
   Subjective:    Patient ID: Andrew Sharp, male    DOB: 10/06/1971, 46 y.o.   MRN: [id number]  HPI Here to follow up on depression and anxiety. He tried Lexapro for about a week but he had to stop it because it was so sedating to him. Even though he took it at night, he felt sleepy all day.    Review of Systems  Constitutional: Negative.   Respiratory: Negative.   Cardiovascular: Negative.   Neurological: Negative.   Psychiatric/Behavioral: Positive for decreased concentration and dysphoric mood. Negative for agitation, confusion and hallucinations. The patient is nervous/anxious.        Objective:   Physical Exam  Constitutional: He is oriented to person, place, and time. He appears well-developed and well-nourished.  Cardiovascular: Normal rate, regular rhythm, normal heart sounds and intact distal pulses.   Pulmonary/Chest: Effort normal and breath sounds normal. No respiratory distress. He has no wheezes. He has no rales.  Neurological: He is alert and oriented to person, place, and time.  Psychiatric: He has a normal mood and affect. His behavior is normal. Thought content normal.          Assessment & Plan:  Depression and anxiety. He will try Wellbutrin XL 150 mg once each morning. Add Ativan 0.5 mg as needed.  Alysia Penna, MD

## 2017-05-24 NOTE — Patient Instructions (Signed)
WE NOW OFFER   [facility] FAST TRACK!!!  SAME DAY Appointments for ACUTE CARE  Such as: Sprains, Injuries, cuts, abrasions, rashes, muscle pain, joint pain, back pain Colds, flu, sore throats, headache, allergies, cough, fever  Ear pain, sinus and eye infections Abdominal pain, nausea, vomiting, diarrhea, upset stomach Animal/insect bites  3 Easy Ways to Schedule: Walk-In Scheduling Call in scheduling Mychart Sign-up: [email or web address]         

## 2017-06-15 DIAGNOSIS — N403 Nodular prostate with lower urinary tract symptoms: Secondary | ICD-10-CM | POA: Diagnosis not present

## 2017-06-15 DIAGNOSIS — R350 Frequency of micturition: Secondary | ICD-10-CM | POA: Diagnosis not present

## 2017-07-03 ENCOUNTER — Other Ambulatory Visit: Payer: Self-pay | Admitting: Family Medicine

## 2017-07-11 ENCOUNTER — Other Ambulatory Visit: Payer: Self-pay | Admitting: Family Medicine

## 2017-08-12 ENCOUNTER — Other Ambulatory Visit: Payer: Self-pay | Admitting: Family Medicine

## 2017-08-31 ENCOUNTER — Other Ambulatory Visit: Payer: Self-pay | Admitting: Family Medicine

## 2017-09-03 NOTE — Telephone Encounter (Signed)
Sent to PCP for approval.  

## 2017-09-14 ENCOUNTER — Other Ambulatory Visit: Payer: Self-pay | Admitting: Family Medicine

## 2017-09-26 ENCOUNTER — Other Ambulatory Visit: Payer: Self-pay | Admitting: Family Medicine

## 2017-09-26 NOTE — Telephone Encounter (Signed)
Call in #[phone] with 2 rf 

## 2017-09-26 NOTE — Telephone Encounter (Signed)
Sent to PCP ?

## 2017-10-12 ENCOUNTER — Other Ambulatory Visit: Payer: Self-pay

## 2017-10-12 ENCOUNTER — Telehealth: Payer: Self-pay

## 2017-10-12 MED ORDER — DEXLANSOPRAZOLE 30 MG PO CPDR: 1.0000 | DELAYED_RELEASE_CAPSULE | Freq: Every day | ORAL | 1 refills | Status: DC

## 2017-10-12 NOTE — Telephone Encounter (Signed)
Rx sent, recall placed for OV June 2019.

## 2017-10-12 NOTE — Telephone Encounter (Signed)
-----   Message from Levin Erp, [place] sent at 10/12/2017  8:42 AM EST ----- Regarding: RE: Refill request Ok to refill for 90 days script with one refill. She needs to have follow in June of next year with someone. So please place a recall. Thanks! JLL  ----- Message ----- From: Roetta Sessions, CMA Sent: 10/11/2017   4:53 PM To: Levin Erp, PA, Roetta Sessions, CMA Subject: Refill request                                 Hello.  [facility] [address]. is requesting a refill of Dexilant 30mg  qd to be sent.  Pt last saw you in June 2018 and does not have an upcoming appt with Dr. Havery Moros.  Ok to refill? If so, how many days (30/90day?) with how many refills? Thank you!  Jan

## 2017-11-07 ENCOUNTER — Other Ambulatory Visit: Payer: Self-pay | Admitting: Family Medicine

## 2017-11-07 ENCOUNTER — Ambulatory Visit: Payer: 59 | Admitting: Family Medicine

## 2017-11-07 ENCOUNTER — Encounter: Payer: Self-pay | Admitting: Family Medicine

## 2017-11-07 VITALS — BP 138/80 | HR 78 | Temp 98.6°F | Wt 252.2 lb

## 2017-11-07 DIAGNOSIS — K5900 Constipation, unspecified: Secondary | ICD-10-CM

## 2017-11-07 DIAGNOSIS — R39198 Other difficulties with micturition: Secondary | ICD-10-CM

## 2017-11-07 DIAGNOSIS — Z23 Encounter for immunization: Secondary | ICD-10-CM | POA: Diagnosis not present

## 2017-11-07 LAB — POCT URINALYSIS DIPSTICK
Bilirubin, UA: NEGATIVE
Blood, UA: NEGATIVE
Glucose, UA: NEGATIVE
Ketones, UA: NEGATIVE
Leukocytes, UA: NEGATIVE
Nitrite, UA: NEGATIVE
Protein, UA: NEGATIVE
Spec Grav, UA: 1.02 (ref 1.010–1.025)
Urobilinogen, UA: 0.2 E.U./dL
pH, UA: 6.5 (ref 5.0–8.0)

## 2017-11-07 NOTE — Progress Notes (Signed)
   Subjective:    Patient ID: Andrew Sharp, male    DOB: 1971/09/07, 47 y.o.   MRN: [id number]  HPI Here for one week of constipation and now 3 days of trouble passing urine. He has bilateral small renal stones as seen at the Urology office last year. A week ago he started having hard infrequent stools and he took a laxative 4 days ago. He had a fair BM after that, but he has passed only a few small hard pellets since then. He feels the urge to urinate now and he has a dull pain in the LLQ and also in the right lower back. No blod in the urine. No fever. No nausea.   Review of Systems  Respiratory: Negative.   Cardiovascular: Negative.   Gastrointestinal: Positive for abdominal pain and constipation. Negative for abdominal distention, anal bleeding, blood in stool, diarrhea, nausea, rectal pain and vomiting.  Genitourinary: Positive for difficulty urinating and urgency. Negative for dysuria, flank pain, frequency and hematuria.       Objective:   Physical Exam  Constitutional: He appears well-developed and well-nourished. No distress.  Cardiovascular: Normal rate, regular rhythm, normal heart sounds and intact distal pulses.  Pulmonary/Chest: Effort normal and breath sounds normal. No respiratory distress. He has no wheezes. He has no rales.  Abdominal: Soft. Bowel sounds are normal. He exhibits no distension and no mass. There is no tenderness. There is no rebound and no guarding.  Genitourinary: Rectum normal and prostate normal.  Genitourinary Comments: Prostate is soft, not swollen, not tender          Assessment & Plan:  Constipation. He will take a bottle of magnesium citrate today and drink plenty of water. He will start using Miralax daily. Recheck prn. Alysia Penna, MD

## 2018-01-07 DIAGNOSIS — Z719 Counseling, unspecified: Secondary | ICD-10-CM | POA: Diagnosis not present

## 2018-02-12 ENCOUNTER — Encounter: Payer: Self-pay | Admitting: Gastroenterology

## 2018-02-12 ENCOUNTER — Other Ambulatory Visit: Payer: Self-pay | Admitting: Family Medicine

## 2018-03-15 ENCOUNTER — Other Ambulatory Visit: Payer: Self-pay | Admitting: Family Medicine

## 2018-03-30 ENCOUNTER — Other Ambulatory Visit: Payer: Self-pay | Admitting: Physician Assistant

## 2018-04-05 NOTE — Patient Instructions (Signed)

## 2018-04-06 ENCOUNTER — Ambulatory Visit: Payer: 59 | Admitting: Sports Medicine

## 2018-04-06 ENCOUNTER — Encounter: Payer: Self-pay | Admitting: Sports Medicine

## 2018-04-06 VITALS — BP 146/93 | HR 69 | Temp 97.8°F | Resp 16 | Ht 69.0 in | Wt 245.0 lb

## 2018-04-06 DIAGNOSIS — L600 Ingrowing nail: Secondary | ICD-10-CM

## 2018-04-06 DIAGNOSIS — M79674 Pain in right toe(s): Secondary | ICD-10-CM | POA: Diagnosis not present

## 2018-04-06 NOTE — Progress Notes (Signed)
Subjective: Andrew Sharp is a 47 y.o. male patient presents to office today complaining of a moderately painful incurvated, red, hot, swollen medial>lateral nail border of the 1st toe on the Right foot. This has been present for 1 week. Patient has treated this by peroxide and epsom salt. Patient denies fever/chills/nausea/vomitting/any other related constitutional symptoms at this time.  Review of Systems  Musculoskeletal:       Right toe pain  Skin:       History of Vitilago  All other systems reviewed and are negative.    Patient Active Problem List   Diagnosis Date Noted  . Depression with anxiety 05/02/2017  . Gross hematuria 01/27/2016  . History of esophageal stricture 03/28/2015  . Regurgitation 03/28/2015  . Stricture and stenosis of esophagus 04/29/2012  . Essential hypertension 09/21/2009  . RECTAL BLEEDING 04/15/2008  . HEMORRHOIDS 04/14/2008  . GERD 03/10/2008  . HEART MURMUR, HX OF 03/10/2008  . NEPHROLITHIASIS, HX OF 03/10/2008    Current Outpatient Medications on File Prior to Visit  Medication Sig Dispense Refill  . amLODipine (NORVASC) 10 MG tablet TAKE 1 TABLET BY MOUTH ONCE DAILY FOR HIGH BLOOD PRESSURE 90 tablet 2  . baclofen (LIORESAL) 10 MG tablet Take 1 tablet (10 mg total) by mouth every morning. 90 tablet 3  . buPROPion (WELLBUTRIN XL) 150 MG 24 hr tablet TAKE 1 TABLET BY MOUTH DAILY 30 tablet 0  . DEXILANT 30 MG capsule TAKE 1 CAPSULE(30 MG) BY MOUTH DAILY 90 capsule 0  . LORazepam (ATIVAN) 0.5 MG tablet TAKE 1 TABLET BY MOUTH EVERY 8 HOURS AS NEEDED FOR ANXIETY 90 tablet 0  . losartan-hydrochlorothiazide (HYZAAR) 100-25 MG tablet TAKE 1 TABLET BY MOUTH DAILY 90 tablet 2  . metoprolol succinate (TOPROL-XL) 50 MG 24 hr tablet TAKE 1 TABLET BY MOUTH ONCE DAILY IMMEDIATELY FOLLOWING A MEAL 90 tablet 2  . naproxen sodium (ANAPROX) 220 MG tablet Take 220 mg by mouth 2 (two) times daily with a meal.    . potassium chloride SA (K-DUR,KLOR-CON) 20 MEQ  tablet Take 1 tablet (20 mEq total) by mouth 2 (two) times daily. 180 tablet 3   No current facility-administered medications on file prior to visit.     No Known Allergies  Objective:  There were no vitals filed for this visit.  General: Well developed, nourished, in no acute distress, alert and oriented x3   Dermatology: Skin is warm, dry and supple bilateral. Right hallux nail appears to be  severely incurvated with hyperkeratosis formation at the distal aspects of  the medial>lateral nail border. (+) Erythema. (+) Edema. (-) serosanguous  drainage present. The remaining nails appear unremarkable at this time. There are no open sores, lesions or other signs of infection present. +Hypopigmentation in patches history of vitilago.   Vascular: Dorsalis Pedis artery and Posterior Tibial artery pedal pulses are 2/4 bilateral with immedate capillary fill time. Pedal hair growth present. No lower extremity edema.   Neruologic: Grossly intact via light touch bilateral.  Musculoskeletal: Tenderness to palpation of the Right hallux nail fold(s). Muscular strength within normal limits in all groups bilateral.   Assesement and Plan: Problem List Items Addressed This Visit    None    Visit Diagnoses    Ingrown nail    -  Primary   Toe pain, right         -Discussed treatment alternatives and plan of care; Explained permanent/temporary nail avulsion and post procedure course to patient. - After a verbal consent,  injected 3 ml of a 50:50 mixture of 2% plain  lidocaine and 0.5% plain marcaine in a normal hallux block fashion. Next, a  betadine prep was performed. Anesthesia was tested and found to be appropriate.  The offending right hallux medial and lateral nail border was then incised from the hyponychium to the epinychium. The offending nail border was removed and cleared from the field. The area was curretted for any remaining nail or spicules. Phenol application performed and the area was  then flushed with alcohol and dressed with antibiotic cream and a dry sterile dressing. -Patient was instructed to leave the dressing intact for today and begin soaking  in a weak solution of betadine or Epsom salt and water tomorrow. Patient was instructed to  soak for 15 minutes each day and apply neosporin and a gauze or bandaid dressing each day. -Patient was instructed to monitor the toe for signs of infection and return to office if toe becomes red, hot or swollen. -Advised ice, elevation, and tylenol or motrin if needed for pain.  -Patient is to return as needed or sooner if problems arise.  Landis Martins, DPM

## 2018-04-06 NOTE — Progress Notes (Signed)
   Subjective:    Patient ID: Andrew Sharp, male    DOB: Sep 19, 1971, 47 y.o.   MRN: [id number]  HPI    Review of Systems  All other systems reviewed and are negative.      Objective:   Physical Exam        Assessment & Plan:

## 2018-04-30 ENCOUNTER — Other Ambulatory Visit: Payer: Self-pay | Admitting: Physician Assistant

## 2018-04-30 ENCOUNTER — Ambulatory Visit: Payer: 59 | Admitting: Gastroenterology

## 2018-04-30 ENCOUNTER — Telehealth: Payer: Self-pay | Admitting: Gastroenterology

## 2018-05-07 ENCOUNTER — Ambulatory Visit: Payer: 59 | Admitting: Family Medicine

## 2018-05-07 ENCOUNTER — Encounter: Payer: Self-pay | Admitting: Family Medicine

## 2018-05-07 VITALS — BP 130/88 | HR 73 | Temp 98.4°F | Ht 69.0 in | Wt 253.2 lb

## 2018-05-07 DIAGNOSIS — M5432 Sciatica, left side: Secondary | ICD-10-CM | POA: Diagnosis not present

## 2018-05-07 MED ORDER — METHYLPREDNISOLONE 4 MG PO TBPK: ORAL_TABLET | ORAL | 0 refills | Status: DC

## 2018-05-07 MED ORDER — METHOCARBAMOL 750 MG PO TABS: 750.0000 mg | ORAL_TABLET | Freq: Four times a day (QID) | ORAL | 2 refills | Status: DC | PRN

## 2018-05-07 NOTE — Progress Notes (Signed)
   Subjective:    Patient ID: Andrew Sharp, male    DOB: [birth year], 47 y.o.   MRN: [id number]  HPI Here for one week of pain in the left lower back that radiates down the left leg. The leg also has some numbness and tingling. Ibuprofen helps a little. He had a similar pain 4 years ago that responded to medications.    Review of Systems  Constitutional: Negative.   Respiratory: Negative.   Cardiovascular: Negative.   Musculoskeletal: Positive for back pain.  Neurological: Positive for numbness. Negative for weakness.       Objective:   Physical Exam  Constitutional: He appears well-developed and well-nourished.  Cardiovascular: Normal rate, regular rhythm, normal heart sounds and intact distal pulses.  Pulmonary/Chest: Effort normal and breath sounds normal.  Musculoskeletal:  Tender in the left lower back and over the left sciatic notch. Full ROM. Negative SLR           Assessment & Plan:  Sciatica. Given Robaxin and a a Medrol dose pack. Recheck prn. Alysia Penna, MD

## 2018-05-18 ENCOUNTER — Emergency Department (HOSPITAL_COMMUNITY): Payer: 59

## 2018-05-18 ENCOUNTER — Emergency Department (HOSPITAL_COMMUNITY)
Admission: EM | Admit: 2018-05-18 | Discharge: 2018-05-18 | Disposition: A | Discharge status: Home / Self Care | Payer: 59 | Attending: Emergency Medicine | Admitting: Emergency Medicine

## 2018-05-18 ENCOUNTER — Other Ambulatory Visit: Payer: Self-pay

## 2018-05-18 DIAGNOSIS — Z79899 Other long term (current) drug therapy: Secondary | ICD-10-CM | POA: Insufficient documentation

## 2018-05-18 DIAGNOSIS — I10 Essential (primary) hypertension: Secondary | ICD-10-CM | POA: Diagnosis not present

## 2018-05-18 DIAGNOSIS — S62651A Nondisplaced fracture of medial phalanx of left index finger, initial encounter for closed fracture: Secondary | ICD-10-CM | POA: Insufficient documentation

## 2018-05-18 DIAGNOSIS — S6991XA Unspecified injury of right wrist, hand and finger(s), initial encounter: Secondary | ICD-10-CM | POA: Diagnosis present

## 2018-05-18 DIAGNOSIS — Y9367 Activity, basketball: Secondary | ICD-10-CM | POA: Insufficient documentation

## 2018-05-18 DIAGNOSIS — Z96652 Presence of left artificial knee joint: Secondary | ICD-10-CM | POA: Insufficient documentation

## 2018-05-18 DIAGNOSIS — S63281A Dislocation of proximal interphalangeal joint of left index finger, initial encounter: Secondary | ICD-10-CM | POA: Diagnosis not present

## 2018-05-18 DIAGNOSIS — S62629A Displaced fracture of medial phalanx of unspecified finger, initial encounter for closed fracture: Secondary | ICD-10-CM

## 2018-05-18 DIAGNOSIS — Y998 Other external cause status: Secondary | ICD-10-CM | POA: Insufficient documentation

## 2018-05-18 DIAGNOSIS — Y929 Unspecified place or not applicable: Secondary | ICD-10-CM | POA: Insufficient documentation

## 2018-05-18 DIAGNOSIS — W228XXA Striking against or struck by other objects, initial encounter: Secondary | ICD-10-CM | POA: Insufficient documentation

## 2018-05-18 DIAGNOSIS — S62642A Nondisplaced fracture of proximal phalanx of right middle finger, initial encounter for closed fracture: Secondary | ICD-10-CM | POA: Diagnosis not present

## 2018-05-18 MED ORDER — LIDOCAINE HCL (PF) 1 % IJ SOLN
5.0000 mL | Freq: Once | INTRAMUSCULAR | Status: AC
Start: 2018-05-18 — End: 2018-05-18
  Administered 2018-05-18: 5 mL
  Filled 2018-05-18: qty 5

## 2018-05-18 NOTE — ED Provider Notes (Signed)
[place] EMERGENCY DEPARTMENT Provider Note   CSN: [id number] Arrival date & time: 05/18/18  7371     History   Chief Complaint Chief Complaint  Patient presents with  . Finger Injury    HPI Andrew Sharp is a 47 y.o. male.  Patient with right finger pain and dislocation since playing basketball this morning and jamming finger.  No other injuries. Pain with movement. HTN hx. Non smoker.     Past Medical History:  Diagnosis Date  . Esophageal stricture   . GERD (gastroesophageal reflux disease)   . History of kidney stones   . Hypertension     Patient Active Problem List   Diagnosis Date Noted  . Depression with anxiety 05/02/2017  . Gross hematuria 01/27/2016  . History of esophageal stricture 03/28/2015  . Regurgitation 03/28/2015  . Stricture and stenosis of esophagus 04/29/2012  . Essential hypertension 09/21/2009  . RECTAL BLEEDING 04/15/2008  . HEMORRHOIDS 04/14/2008  . GERD 03/10/2008  . HEART MURMUR, HX OF 03/10/2008  . NEPHROLITHIASIS, HX OF 03/10/2008    Past Surgical History:  Procedure Laterality Date  . COLONOSCOPY  03-16-06   per Dr. Deatra Ina, nonspecific colitis and int. hemorrhoids  . CT CORONARY CA SCORING  09-19-11   for chest pains, negative   . KNEE ARTHROPLASTY Left         Home Medications    Prior to Admission medications   Medication Sig Start Date End Date Taking? Authorizing Provider  amLODipine (NORVASC) 10 MG tablet TAKE 1 TABLET BY MOUTH ONCE DAILY FOR HIGH BLOOD PRESSURE 11/07/17   Laurey Morale, MD  baclofen (LIORESAL) 10 MG tablet TAKE 1 TABLET BY MOUTH EVERY MORNING 05/02/18   Levin Erp, PA  DEXILANT 30 MG capsule TAKE 1 CAPSULE(30 MG) BY MOUTH DAILY 04/01/18   Armbruster, Carlota Raspberry, MD  LORazepam (ATIVAN) 0.5 MG tablet TAKE 1 TABLET BY MOUTH EVERY 8 HOURS AS NEEDED FOR ANXIETY 09/27/17   Laurey Morale, MD  losartan-hydrochlorothiazide [facility]) 100-25 MG tablet TAKE 1 TABLET BY MOUTH  DAILY 11/07/17   Laurey Morale, MD  methocarbamol (ROBAXIN-750) 750 MG tablet Take 1 tablet (750 mg total) by mouth every 6 (six) hours as needed for muscle spasms. 05/07/18   Laurey Morale, MD  methylPREDNISolone (MEDROL DOSEPAK) 4 MG TBPK tablet As directed 05/07/18   Laurey Morale, MD  metoprolol succinate (TOPROL-XL) 50 MG 24 hr tablet TAKE 1 TABLET BY MOUTH ONCE DAILY IMMEDIATELY FOLLOWING A MEAL 09/17/17   Laurey Morale, MD  potassium chloride SA (K-DUR,KLOR-CON) 20 MEQ tablet Take 1 tablet (20 mEq total) by mouth 2 (two) times daily. 02/16/17   Laurey Morale, MD    Family History Family History  Problem Relation Age of Onset  . Colon cancer Maternal Aunt   . Pancreatic cancer Maternal Grandmother   . Crohn's disease Other        niece  . Diabetes Brother        MOTHER,AUNT  . Heart disease Mother   . Kidney disease Mother   . Prostate cancer Maternal Grandfather   . Esophageal cancer Neg Hx   . Rectal cancer Neg Hx   . Stomach cancer Neg Hx     Social History Social History   Tobacco Use  . Smoking status: Never Smoker  . Smokeless tobacco: Never Used  Substance Use Topics  . Alcohol use: No    Alcohol/week: 0.0 oz  . Drug use: No  Allergies   Patient has no known allergies.   Review of Systems Review of Systems  Constitutional: Negative for fever.  Skin: Positive for wound.  Neurological: Positive for numbness. Negative for weakness.     Physical Exam Updated Vital Signs BP (!) 156/99 (BP Location: Right Arm)   Pulse 84   Ht 5\' 9"  (1.753 m)   Wt 111.1 kg (245 lb)   SpO2 94%   BMI 36.18 kg/m   Physical Exam  Constitutional: He is oriented to person, place, and time. He appears well-developed and well-nourished.  HENT:  Head: Normocephalic and atraumatic.  Eyes: Right eye exhibits no discharge. Left eye exhibits no discharge.  Neck: No tracheal deviation present.  Cardiovascular: Normal rate.  Pulmonary/Chest: Effort normal.  Abdominal:  There is no guarding.  Musculoskeletal: He exhibits tenderness and deformity.  Neurological: He is alert and oriented to person, place, and time.  Skin: Skin is warm. No rash noted.  Patient has dorsal deformity of left index finger distal to PIP joint.  No significant tenderness.  Decreased ability to flex.  After reduction patient has normal range of motion flexion extension at PIP joint.  No laceration.  Psychiatric: He has a normal mood and affect.  Nursing note and vitals reviewed.    ED Treatments / Results  Labs (all labs ordered are listed, but only abnormal results are displayed) Labs Reviewed - No data to display  EKG None  Radiology Dg Finger Index Left  Result Date: 05/18/2018 CLINICAL DATA:  Status post reduction of the left index finger. EXAM: LEFT INDEX FINGER 2+V COMPARISON:  None. FINDINGS: A nondisplaced fracture is present the ventral aspect of the base the middle phalanx. Soft tissue swelling is noted. No additional fractures are present. The PIP joint is located. IMPRESSION: 1. PIP joint is located. 2. Nondisplaced fracture involving the volar surface of the base of the middle phalanx. Electronically Signed   By: San Morelle M.D.   On: 05/18/2018 08:05    Procedures Reduction of dislocation Date/Time: 05/18/2018 8:16 AM Performed by: Elnora Morrison, MD Authorized by: Elnora Morrison, MD  Consent: Verbal consent obtained. Consent given by: patient Patient understanding: patient states understanding of the procedure being performed Patient consent: the patient's understanding of the procedure matches consent given Patient identity confirmed: verbally with patient and arm band Time out: Immediately prior to procedure a "time out" was called to verify the correct patient, procedure, equipment, support staff and site/side marked as required. Preparation: Patient was prepped and draped in the usual sterile fashion. Local anesthesia used: yes Anesthesia:  digital block  Anesthesia: Local anesthesia used: yes Local Anesthetic: lidocaine 2% without epinephrine Anesthetic total: 5 mL  Sedation: Patient sedated: no  Patient tolerance: Patient tolerated the procedure well with no immediate complications Comments: Finger index left PIP relocated one attempt    (including critical care time)  Medications Ordered in ED Medications  lidocaine (PF) (XYLOCAINE) 1 % injection 5 mL (5 mLs Infiltration Given 05/18/18 0803)     Initial Impression / Assessment and Plan / ED Course  I have reviewed the triage vital signs and the nursing notes.  Pertinent labs & imaging results that were available during my care of the patient were reviewed by me and considered in my medical decision making (see chart for details).    Patient presents with clinically dislocated finger.  Bedside ultrasound used to confirm dorsal dislocation.  Discussed with patient risks and benefits of finger block and reduction, patient okay to  proceed and we will obtain x-ray afterwards. Finger reduced without difficulty.  X-ray reviewed  occult fracture at the base of PIP joint.  Primary care/orthopedic follow-up.  Fingers buddy taped for support. Finger splint ordered as well for home.   Results and differential diagnosis were discussed with the patient/parent/guardian. Xrays were independently reviewed by myself.  Close follow up outpatient was discussed, comfortable with the plan.   Medications  lidocaine (PF) (XYLOCAINE) 1 % injection 5 mL (5 mLs Infiltration Given 05/18/18 0803)    Vitals:   05/18/18 0716 05/18/18 0717  BP: (!) 156/99   Pulse: 84   SpO2: 94%   Weight:  111.1 kg (245 lb)  Height:  5\' 9"  (1.753 m)    Final diagnoses:  Dislocation of proximal interphalangeal joint of left index finger, initial encounter  Closed avulsion fracture of middle phalanx of finger, initial encounter  ]  Final Clinical Impressions(s) / ED Diagnoses   Final diagnoses:    Dislocation of proximal interphalangeal joint of left index finger, initial encounter  Closed avulsion fracture of middle phalanx of finger, initial encounter    ED Discharge Orders    None       Elnora Morrison, MD 05/18/18 [phone]

## 2018-05-18 NOTE — Discharge Instructions (Signed)
Tylenol and ice as needed for pain. No significant weight bearing until pain resolved or cleared by doctor.

## 2018-05-18 NOTE — ED Triage Notes (Signed)
Patient states he dislocated his finger playing basketball.

## 2018-06-04 ENCOUNTER — Other Ambulatory Visit: Payer: Self-pay | Admitting: Family Medicine

## 2018-06-25 ENCOUNTER — Other Ambulatory Visit: Payer: Self-pay | Admitting: Family Medicine

## 2018-06-28 ENCOUNTER — Encounter (INDEPENDENT_AMBULATORY_CARE_PROVIDER_SITE_OTHER): Payer: Self-pay

## 2018-06-28 ENCOUNTER — Encounter: Payer: Self-pay | Admitting: Gastroenterology

## 2018-06-28 ENCOUNTER — Other Ambulatory Visit (INDEPENDENT_AMBULATORY_CARE_PROVIDER_SITE_OTHER): Payer: 59

## 2018-06-28 ENCOUNTER — Ambulatory Visit: Payer: 59 | Admitting: Gastroenterology

## 2018-06-28 VITALS — BP 144/88 | HR 72 | Ht 68.5 in | Wt 250.4 lb

## 2018-06-28 DIAGNOSIS — K219 Gastro-esophageal reflux disease without esophagitis: Secondary | ICD-10-CM

## 2018-06-28 DIAGNOSIS — Z86010 Personal history of colonic polyps: Secondary | ICD-10-CM

## 2018-06-28 LAB — BASIC METABOLIC PANEL
BUN: 12 mg/dL (ref 6–23)
CO2: 32 mEq/L (ref 19–32)
Calcium: 9 mg/dL (ref 8.4–10.5)
Chloride: 103 mEq/L (ref 96–112)
Creatinine, Ser: 1.14 mg/dL (ref 0.40–1.50)
GFR: 88.37 mL/min (ref >60.00)
Glucose, Bld: 103 mg/dL — ABNORMAL HIGH (ref 70–99)
Potassium: 3 mEq/L — ABNORMAL LOW (ref 3.5–5.1)
Sodium: 142 mEq/L (ref 135–145)

## 2018-06-28 MED ORDER — BACLOFEN 10 MG PO TABS: 20.0000 mg | ORAL_TABLET | Freq: Every day | ORAL | 0 refills | Status: DC

## 2018-06-28 NOTE — Patient Instructions (Addendum)
If you are age 47 or older, your body mass index should be between 23-30. Your Body mass index is 37.52 kg/m. If this is out of the aforementioned range listed, please consider follow up with your Primary Care Provider.  If you are age 29 or younger, your body mass index should be between 19-25. Your Body mass index is 37.52 kg/m. If this is out of the aformentioned range listed, please consider follow up with your Primary Care Provider.   Your provider has requested that you go to the basement level for lab work before leaving today. Press "B" on the elevator. The lab is located at the first door on the left as you exit the elevator. BMET  We have sent the following medications to your pharmacy for you to pick up at your convenience: Baclofen - take 2 at bedtime.  Continue Dexilant.  Recall Colonoscopy 2022.  Follow up in one year.  Thank you for choosing me and [facility].   [place] Cellar, MD

## 2018-06-28 NOTE — Progress Notes (Signed)
HPI :  47 year old male with a history of reflux, known to me, here for follow-up visit.  His last EGD was in 2016 which was a normal exam. He has no history of Barrett's esophagus. He's been using Dexilant 30 mg once daily to control his reflux and using baclofen 10 mg daily at bedtime to help with nocturnal symptoms. He states this regimen has generally been helping him, however he does have some breakthrough about 3-4 times per week which bother him. It usually nocturnal symptoms with regurgitation that bother him from this the most. He denies any dysphagia. He denies any abdominal pains. No problems with his bowels at all. He does not have any routine blood in his stools. His last colonoscopy was in 2017 showing one cecal adenoma, due for a follow-up colonoscopy in 2022.  Endoscopic history: EGD 05/26/15 - normal exam EGD 02/06/12 - ? GEJ mild ring, dilated to 43mm [place], he reported benefit Colonoscopy 03/18/06 - mild rectal erythema with nonspecific inflammation, hemorrhoids Colonoscopy 03/27/16 - sigmoid diverticulosis, internal hemorrhoids, 19mm cecal polyp - TA - recall 03/2021    Past Medical History:  Diagnosis Date  . Esophageal stricture   . GERD (gastroesophageal reflux disease)   . History of kidney stones   . Hypertension      Past Surgical History:  Procedure Laterality Date  . COLONOSCOPY  03-16-06   per Dr. Deatra Ina, nonspecific colitis and int. hemorrhoids  . CT CORONARY CA SCORING  09-19-11   for chest pains, negative   . KNEE ARTHROPLASTY Left    Family History  Problem Relation Age of Onset  . Colon cancer Maternal Aunt   . Pancreatic cancer Maternal Grandmother   . Crohn's disease Other        niece  . Diabetes Brother        MOTHER,AUNT  . Heart disease Mother   . Kidney disease Mother   . Prostate cancer Maternal Grandfather   . Esophageal cancer Neg Hx   . Rectal cancer Neg Hx   . Stomach cancer Neg Hx    Social History   Tobacco Use  . Smoking  status: Never Smoker  . Smokeless tobacco: Never Used  Substance Use Topics  . Alcohol use: No    Alcohol/week: 0.0 standard drinks  . Drug use: No   Current Outpatient Medications  Medication Sig Dispense Refill  . amLODipine (NORVASC) 10 MG tablet TAKE 1 TABLET BY MOUTH ONCE DAILY FOR HIGH BLOOD PRESSURE 90 tablet 2  . baclofen (LIORESAL) 10 MG tablet TAKE 1 TABLET BY MOUTH EVERY MORNING 90 tablet 0  . DEXILANT 30 MG capsule TAKE 1 CAPSULE(30 MG) BY MOUTH DAILY 90 capsule 0  . LORazepam (ATIVAN) 0.5 MG tablet TAKE 1 TABLET BY MOUTH EVERY 8 HOURS AS NEEDED FOR ANXIETY 90 tablet 0  . losartan-hydrochlorothiazide (HYZAAR) 100-25 MG tablet TAKE 1 TABLET BY MOUTH DAILY 90 tablet 2  . methocarbamol (ROBAXIN-750) 750 MG tablet Take 1 tablet (750 mg total) by mouth every 6 (six) hours as needed for muscle spasms. 60 tablet 2  . metoprolol succinate (TOPROL-XL) 50 MG 24 hr tablet TAKE 1 TABLET BY MOUTH ONCE DAILY IMMEDIATELY FOLLOWING A MEAL 90 tablet 0  . potassium chloride SA (K-DUR,KLOR-CON) 20 MEQ tablet TAKE 1 TABLET BY MOUTH TWICE A DAY 180 tablet 0   No current facility-administered medications for this visit.    No Known Allergies   Review of Systems: All systems reviewed and negative except where noted in  HPI.   Physical Exam: BP (!) 144/88 (BP Location: Left Arm, Patient Position: Sitting, Cuff Size: Normal)   Pulse 72   Ht 5' 8.5" (1.74 m)   Wt 250 lb 6 oz (113.6 kg)   BMI 37.52 kg/m  Constitutional: Pleasant,well-developed, male in no acute distress. HEENT: Normocephalic and atraumatic. Conjunctivae are normal. No scleral icterus. Neck supple.  Cardiovascular: Normal rate, regular rhythm.  Pulmonary/chest: Effort normal and breath sounds normal. No wheezing, rales or rhonchi. Abdominal: Soft, nondistended, nontender.  There are no masses palpable. No hepatomegaly. Extremities: no edema Lymphadenopathy: No cervical adenopathy noted. Neurological: Alert and oriented to  person place and time. Skin: Skin is warm and dry. No rashes noted. Psychiatric: Normal mood and affect. Behavior is normal.   ASSESSMENT AND PLAN: 47 year old male here for reassessment following issues:  GERD - generally he is pretty happy with regimen of Dexilant and Baclofen, but having some periodic nocturnal symptoms. He is tolerating Baclofen well. We discussed options. Given the nocturnal symptoms, will increase the baclofen to 20mg  q HS if he tolerates it (discussed this can cause sedation). Hopefully this may help some. If not, and he is not happy with the regimen due to regurgitation, could consider endoscopic fundoplication or surgical alternatives. We otherwise discussed the risks and benefits of long-term PPI use at length, as he has been on this for a while. We'll check his kidney function to ensure stable on this regimen. Otherwise he will follow up in one year or sooner if the baclofen does not help him.  History of colon adenoma - due for surveillance colonoscopy 2022.  [place] Cellar, MD [facility]

## 2018-07-06 ENCOUNTER — Other Ambulatory Visit: Payer: Self-pay | Admitting: Gastroenterology

## 2018-07-07 ENCOUNTER — Other Ambulatory Visit: Payer: Self-pay | Admitting: Gastroenterology

## 2018-07-15 ENCOUNTER — Ambulatory Visit: Payer: 59 | Admitting: Family Medicine

## 2018-07-15 ENCOUNTER — Encounter: Payer: Self-pay | Admitting: Family Medicine

## 2018-07-15 VITALS — BP 144/90 | HR 72 | Temp 98.7°F | Wt 246.0 lb

## 2018-07-15 DIAGNOSIS — Z23 Encounter for immunization: Secondary | ICD-10-CM

## 2018-07-15 DIAGNOSIS — M791 Myalgia, unspecified site: Secondary | ICD-10-CM | POA: Diagnosis not present

## 2018-07-15 DIAGNOSIS — E876 Hypokalemia: Secondary | ICD-10-CM

## 2018-07-15 DIAGNOSIS — I10 Essential (primary) hypertension: Secondary | ICD-10-CM | POA: Diagnosis not present

## 2018-07-15 MED ORDER — METOPROLOL SUCCINATE ER 100 MG PO TB24: 100.0000 mg | ORAL_TABLET | Freq: Every day | ORAL | 3 refills | Status: DC

## 2018-07-15 MED ORDER — LOSARTAN POTASSIUM 100 MG PO TABS
100.0000 mg | ORAL_TABLET | Freq: Every day | ORAL | 3 refills | Status: DC
Start: 2018-07-15 — End: 2019-07-14

## 2018-07-15 MED ORDER — POTASSIUM CHLORIDE CRYS ER 20 MEQ PO TBCR: 20.0000 meq | EXTENDED_RELEASE_TABLET | Freq: Three times a day (TID) | ORAL | 3 refills | Status: DC

## 2018-07-15 NOTE — Progress Notes (Signed)
   Subjective:    Patient ID: Andrew Sharp, male    DOB: [birth year], 47 y.o.   MRN: [id number]  HPI Here for several weeks of intermittent dull aching pains in both lower legs. He was here in July for left sided sciatica, but he says that went away and this recent pain is very different in nature. He saw GI a few weeks ago and had labs which revealed his chronically low potassium to be down to 3.0. He currently takes potassium 20 mEq bid.    Review of Systems  Constitutional: Negative.   Respiratory: Negative.   Cardiovascular: Negative.   Musculoskeletal: Positive for myalgias.  Neurological: Negative.        Objective:   Physical Exam  Constitutional: He is oriented to person, place, and time. He appears well-developed and well-nourished.  Cardiovascular: Normal rate, regular rhythm, normal heart sounds and intact distal pulses.  Pulmonary/Chest: Effort normal and breath sounds normal.  Musculoskeletal: He exhibits no edema or tenderness.  Neurological: He is alert and oriented to person, place, and time.          Assessment & Plan:  Muscle aches related to hypokalemia. Increase the potassium to TID. Stop the HCTZ and go to plain Losartan 100 mg daily. Increase Metoprolol succinate to 100 mg daily. Recheck in 2 weeks.  Alysia Penna, MD

## 2018-07-31 ENCOUNTER — Other Ambulatory Visit: Payer: Self-pay | Admitting: Family Medicine

## 2018-07-31 NOTE — Telephone Encounter (Signed)
Dr. Fry please advise on refill. Thanks  

## 2018-08-03 ENCOUNTER — Other Ambulatory Visit: Payer: Self-pay | Admitting: Gastroenterology

## 2018-08-15 ENCOUNTER — Other Ambulatory Visit: Payer: Self-pay | Admitting: Family Medicine

## 2018-09-14 ENCOUNTER — Other Ambulatory Visit: Payer: Self-pay | Admitting: Gastroenterology

## 2018-09-16 ENCOUNTER — Ambulatory Visit: Payer: 59 | Admitting: Family Medicine

## 2018-09-16 ENCOUNTER — Encounter: Payer: Self-pay | Admitting: Family Medicine

## 2018-09-16 VITALS — BP 150/96 | HR 74 | Temp 98.4°F | Wt 256.4 lb

## 2018-09-16 DIAGNOSIS — N401 Enlarged prostate with lower urinary tract symptoms: Secondary | ICD-10-CM

## 2018-09-16 DIAGNOSIS — N138 Other obstructive and reflux uropathy: Secondary | ICD-10-CM

## 2018-09-16 DIAGNOSIS — E876 Hypokalemia: Secondary | ICD-10-CM

## 2018-09-16 DIAGNOSIS — R739 Hyperglycemia, unspecified: Secondary | ICD-10-CM

## 2018-09-16 DIAGNOSIS — I10 Essential (primary) hypertension: Secondary | ICD-10-CM | POA: Diagnosis not present

## 2018-09-16 LAB — BASIC METABOLIC PANEL
BUN: 8 mg/dL (ref 6–23)
CO2: 31 mEq/L (ref 19–32)
Calcium: 9.2 mg/dL (ref 8.4–10.5)
Chloride: 103 mEq/L (ref 96–112)
Creatinine, Ser: 1.11 mg/dL (ref 0.40–1.50)
GFR: 91.05 mL/min (ref >60.00)
Glucose, Bld: 78 mg/dL (ref 70–99)
Potassium: 3.3 mEq/L — ABNORMAL LOW (ref 3.5–5.1)
Sodium: 142 mEq/L (ref 135–145)

## 2018-09-16 LAB — LIPID PANEL
Cholesterol: 164 mg/dL (ref 0–200)
HDL: 24 mg/dL — ABNORMAL LOW (ref >39.00)
NonHDL: 140.45
Total CHOL/HDL Ratio: 7
Triglycerides: 287 mg/dL — ABNORMAL HIGH (ref 0.0–149.0)
VLDL: 57.4 mg/dL — ABNORMAL HIGH (ref 0.0–40.0)

## 2018-09-16 LAB — HEPATIC FUNCTION PANEL
ALT: 27 U/L (ref 0–53)
AST: 26 U/L (ref 0–37)
Albumin: 4.4 g/dL (ref 3.5–5.2)
Alkaline Phosphatase: 77 U/L (ref 39–117)
Bilirubin, Direct: 0.1 mg/dL (ref 0.0–0.3)
Total Bilirubin: 0.5 mg/dL (ref 0.2–1.2)
Total Protein: 7.4 g/dL (ref 6.0–8.3)

## 2018-09-16 LAB — POC URINALSYSI DIPSTICK (AUTOMATED)
Bilirubin, UA: NEGATIVE
Blood, UA: NEGATIVE
Glucose, UA: NEGATIVE
Ketones, UA: NEGATIVE
Leukocytes, UA: NEGATIVE
Nitrite, UA: NEGATIVE
Protein, UA: NEGATIVE
Spec Grav, UA: 1.015 (ref 1.010–1.025)
Urobilinogen, UA: 0.2 E.U./dL
pH, UA: 6.5 (ref 5.0–8.0)

## 2018-09-16 LAB — LDL CHOLESTEROL, DIRECT: Direct LDL: 93 mg/dL

## 2018-09-16 LAB — CBC WITH DIFFERENTIAL/PLATELET
Basophils Absolute: 0 10*3/uL (ref 0.0–0.1)
Basophils Relative: 0.6 % (ref 0.0–3.0)
Eosinophils Absolute: 0.2 10*3/uL (ref 0.0–0.7)
Eosinophils Relative: 2.8 % (ref 0.0–5.0)
HCT: 41.6 % (ref 39.0–52.0)
Hemoglobin: 14.8 g/dL (ref 13.0–17.0)
Lymphocytes Relative: 41.5 % (ref 12.0–46.0)
Lymphs Abs: 3.2 10*3/uL (ref 0.7–4.0)
MCHC: 35.7 g/dL (ref 30.0–36.0)
MCV: 89.3 fl (ref 78.0–100.0)
Monocytes Absolute: 1.1 10*3/uL — ABNORMAL HIGH (ref 0.1–1.0)
Monocytes Relative: 13.8 % — ABNORMAL HIGH (ref 3.0–12.0)
Neutro Abs: 3.2 10*3/uL (ref 1.4–7.7)
Neutrophils Relative %: 41.3 % — ABNORMAL LOW (ref 43.0–77.0)
Platelets: 225 10*3/uL (ref 150.0–400.0)
RBC: 4.66 Mil/uL (ref 4.22–5.81)
RDW: 13.6 % (ref 11.5–15.5)
WBC: 7.8 10*3/uL (ref 4.0–10.5)

## 2018-09-16 LAB — TSH: TSH: 1.81 u[IU]/mL (ref 0.35–4.50)

## 2018-09-16 LAB — HEMOGLOBIN A1C: Hgb A1c MFr Bld: 5.5 % (ref 4.6–6.5)

## 2018-09-16 LAB — PSA: PSA: 1.15 ng/mL (ref 0.10–4.00)

## 2018-09-16 NOTE — Progress Notes (Signed)
   Subjective:    Patient ID: Andrew Sharp, male    DOB: [birth year], 47 y.o.   MRN: [id number]  HPI Here with concerns about possible diabetes and to recheck his potassium level. He has a family hx of diabetes. He says he has been more tired than usual lately and he is more thirsty than usual and is urinating a lot. He recently had a potassium level of 3.0, so we increased his daily dose of potassium to 20 mEq tid. His random glucose on 06-28-18 was 103.    Review of Systems  Constitutional: Positive for fatigue.  Respiratory: Negative.   Cardiovascular: Negative.   Neurological: Negative.        Objective:   Physical Exam  Constitutional: He is oriented to person, place, and time. He appears well-developed and well-nourished.  Cardiovascular: Normal rate, regular rhythm, normal heart sounds and intact distal pulses.  Pulmonary/Chest: Effort normal and breath sounds normal.  Musculoskeletal: He exhibits no edema.  Neurological: He is alert and oriented to person, place, and time.          Assessment & Plan:  He has concerns about possible diabetes, so we will check labs today including an A1c. Recheck the potassium as well.  Alysia Penna, MD

## 2018-09-23 ENCOUNTER — Other Ambulatory Visit: Payer: Self-pay | Admitting: Family Medicine

## 2018-10-01 DIAGNOSIS — H00024 Hordeolum internum left upper eyelid: Secondary | ICD-10-CM | POA: Diagnosis not present

## 2018-10-17 DIAGNOSIS — M25562 Pain in left knee: Secondary | ICD-10-CM | POA: Diagnosis not present

## 2018-10-17 DIAGNOSIS — M25561 Pain in right knee: Secondary | ICD-10-CM | POA: Diagnosis not present

## 2018-11-28 DIAGNOSIS — M25561 Pain in right knee: Secondary | ICD-10-CM | POA: Diagnosis not present

## 2018-12-30 DIAGNOSIS — L218 Other seborrheic dermatitis: Secondary | ICD-10-CM | POA: Diagnosis not present

## 2019-01-01 ENCOUNTER — Other Ambulatory Visit: Payer: Self-pay

## 2019-01-01 MED ORDER — DEXLANSOPRAZOLE 30 MG PO CPDR: DELAYED_RELEASE_CAPSULE | ORAL | 1 refills | Status: DC

## 2019-01-01 NOTE — Progress Notes (Signed)
Request for refill - [facility]

## 2019-02-11 ENCOUNTER — Other Ambulatory Visit: Payer: Self-pay | Admitting: Family Medicine

## 2019-04-23 ENCOUNTER — Ambulatory Visit: Payer: 59 | Admitting: Family Medicine

## 2019-04-23 ENCOUNTER — Encounter: Payer: Self-pay | Admitting: Family Medicine

## 2019-04-23 ENCOUNTER — Other Ambulatory Visit: Payer: Self-pay

## 2019-04-23 VITALS — BP 154/100 | HR 74 | Temp 98.4°F | Ht 68.5 in | Wt 250.8 lb

## 2019-04-23 DIAGNOSIS — R739 Hyperglycemia, unspecified: Secondary | ICD-10-CM

## 2019-04-23 DIAGNOSIS — R350 Frequency of micturition: Secondary | ICD-10-CM

## 2019-04-23 LAB — HEMOGLOBIN A1C: Hgb A1c MFr Bld: 5.5 % (ref 4.6–6.5)

## 2019-04-23 LAB — GLUCOSE, POCT (MANUAL RESULT ENTRY): POC Glucose: 131 mg/dl — AB (ref 70–99)

## 2019-04-23 NOTE — Progress Notes (Signed)
   Subjective:    Patient ID: Andrew Sharp, male    DOB: 1971/10/18, 48 y.o.   MRN: [id number]  HPI Here with concerns about possible diabetes. He has a strong family hx of diabetes. Over the past few months he has been experiencing extreme thirst and he is urinating frequently. He gets up twice a night to urinate. He feels more tired than usual. He does not watch his diet at all. He has actually lost 6 lbs since November his A1c at that time was 5.5. His fingerstick glucose after drinking some sweet tea this morning is 131.    Review of Systems  Constitutional: Positive for fatigue.  Respiratory: Negative.   Cardiovascular: Negative.   Endocrine: Positive for polydipsia and polyuria.       Objective:   Physical Exam Constitutional:      Appearance: Normal appearance.  Cardiovascular:     Rate and Rhythm: Normal rate and regular rhythm.     Pulses: Normal pulses.     Heart sounds: Normal heart sounds.  Pulmonary:     Effort: Pulmonary effort is normal.     Breath sounds: Normal breath sounds.  Neurological:     General: No focal deficit present.     Mental Status: He is alert and oriented to person, place, and time.           Assessment & Plan:  Possible type 2 diabetes. We will send him for an A1c. I reviewed dietary advice about reducing the sweets and carbs in his diet. Alysia Penna, MD

## 2019-04-24 ENCOUNTER — Encounter: Payer: Self-pay | Admitting: *Deleted

## 2019-06-10 ENCOUNTER — Encounter: Payer: Self-pay | Admitting: Gastroenterology

## 2019-06-11 ENCOUNTER — Other Ambulatory Visit: Payer: Self-pay | Admitting: Family Medicine

## 2019-07-08 ENCOUNTER — Ambulatory Visit: Payer: 59 | Admitting: Sports Medicine

## 2019-07-08 ENCOUNTER — Encounter: Payer: Self-pay | Admitting: Sports Medicine

## 2019-07-08 ENCOUNTER — Ambulatory Visit (INDEPENDENT_AMBULATORY_CARE_PROVIDER_SITE_OTHER): Payer: 59

## 2019-07-08 ENCOUNTER — Other Ambulatory Visit: Payer: Self-pay

## 2019-07-08 DIAGNOSIS — M79672 Pain in left foot: Secondary | ICD-10-CM | POA: Diagnosis not present

## 2019-07-08 DIAGNOSIS — M722 Plantar fascial fibromatosis: Secondary | ICD-10-CM | POA: Diagnosis not present

## 2019-07-08 DIAGNOSIS — M79671 Pain in right foot: Secondary | ICD-10-CM | POA: Diagnosis not present

## 2019-07-08 MED ORDER — MELOXICAM 15 MG PO TABS: 15.0000 mg | ORAL_TABLET | Freq: Every day | ORAL | 0 refills | Status: DC

## 2019-07-08 NOTE — Progress Notes (Signed)
Subjective: Andrew Sharp is a 48 y.o. male patient presents to office with complaint of mild arch and  heel pain on the left and right. Patient admits to post static dyskinesia worse in the morning however pain is there all day achy in nature for many years. Patient has treated this problem with custom insoles for this problem which used to help and gentle stretching.  Patient denies any other pedal complaints.   Patient Active Problem List   Diagnosis Date Noted  . Hypokalemia 07/15/2018  . Depression with anxiety 05/02/2017  . Gross hematuria 01/27/2016  . History of esophageal stricture 03/28/2015  . Regurgitation 03/28/2015  . Stricture and stenosis of esophagus 04/29/2012  . Essential hypertension 09/21/2009  . RECTAL BLEEDING 04/15/2008  . HEMORRHOIDS 04/14/2008  . GERD 03/10/2008  . HEART MURMUR, HX OF 03/10/2008  . NEPHROLITHIASIS, HX OF 03/10/2008    Current Outpatient Medications on File Prior to Visit  Medication Sig Dispense Refill  . amLODipine (NORVASC) 10 MG tablet TAKE 1 TABLET BY MOUTH EVERY DAY 90 tablet 1  . baclofen (LIORESAL) 10 MG tablet TAKE 2 TABLETS BY MOUTH AT BEDTIME 180 tablet 3  . buPROPion (WELLBUTRIN XL) 150 MG 24 hr tablet TAKE 1 TABLET BY MOUTH DAILY 30 tablet 5  . Dexlansoprazole (DEXILANT) 30 MG capsule TAKE 1 CAPSULE(30 MG) BY MOUTH DAILY 90 capsule 1  . LORazepam (ATIVAN) 0.5 MG tablet TAKE 1 TABLET BY MOUTH EVERY 8 HOURS AS NEEDED FOR ANXIETY 90 tablet 0  . losartan (COZAAR) 100 MG tablet Take 1 tablet (100 mg total) by mouth daily. 90 tablet 3  . methocarbamol (ROBAXIN-750) 750 MG tablet Take 1 tablet (750 mg total) by mouth every 6 (six) hours as needed for muscle spasms. 60 tablet 2  . metoprolol succinate (TOPROL-XL) 100 MG 24 hr tablet Take 1 tablet (100 mg total) by mouth daily. Take with or immediately following a meal. 90 tablet 3  . potassium chloride SA (K-DUR,KLOR-CON) 20 MEQ tablet Take 1 tablet (20 mEq total) by mouth 3 (three)  times daily. 270 tablet 3   No current facility-administered medications on file prior to visit.     No Known Allergies  Objective: Physical Exam General: The patient is alert and oriented x3 in no acute distress.  Dermatology: Skin is warm, dry and supple bilateral lower extremities. Nails 1-10 are normal. There is no erythema, edema, no eccymosis, no open lesions present. Integument is otherwise unremarkable.  Vascular: Dorsalis Pedis pulse and Posterior Tibial pulse are 2/4 bilateral. Capillary fill time is immediate to all digits.  Neurological: Grossly intact to light touch with an achilles reflex of +2/5 and a  negative Tinel's sign bilateral.  Musculoskeletal: Mild tenderness to palpation at the medial calcaneal tubercale and through the insertion of the plantar fascia on the left and right foot. + Pes planus. No pain with compression of calcaneus bilateral. No pain with tuning fork to calcaneus bilateral. No pain with calf compression bilateral. There is decreased Ankle joint range of motion bilateral. All other joints range of motion within normal limits bilateral. Strength 5/5 in all groups bilateral.   Gait: Unassisted, minimally antalgic  Xray, Right/Left foot:  Normal osseous mineralization. Joint spaces preserved. No fracture/dislocation/boney destruction. Calcaneal spur present with mild thickening of plantar fascia. No other soft tissue abnormalities or radiopaque foreign bodies.   Assessment and Plan: Problem List Items Addressed This Visit    None    Visit Diagnoses    Plantar fasciitis    -  Primary   Relevant Orders   DG Foot Complete Right (Completed)   DG Foot Complete Left (Completed)   Bilateral foot pain         -Complete examination performed.  -Xrays reviewed -Discussed with patient in detail the condition of plantar fasciitis, how this occurs and general treatment options. Explained both conservative and surgical treatments.  -Rx  Meloxicam -Recommended good supportive shoes and advised new custom insoles; office to check benefits and schedule if he wants them -Explained and dispensed to patient daily stretching exercises. -Recommend patient to ice affected area 1-2x daily. -Patient to return to office for orthotics or sooner if problems or questions arise.  Landis Martins, DPM

## 2019-07-08 NOTE — Patient Instructions (Signed)

## 2019-07-11 ENCOUNTER — Other Ambulatory Visit: Payer: Self-pay | Admitting: Family Medicine

## 2019-07-15 ENCOUNTER — Encounter: Payer: Self-pay | Admitting: Gastroenterology

## 2019-07-15 ENCOUNTER — Ambulatory Visit: Payer: 59 | Admitting: Gastroenterology

## 2019-07-15 VITALS — BP 152/104 | HR 64 | Temp 98.1°F | Ht 68.5 in | Wt 255.0 lb

## 2019-07-15 DIAGNOSIS — K219 Gastro-esophageal reflux disease without esophagitis: Secondary | ICD-10-CM | POA: Diagnosis not present

## 2019-07-15 DIAGNOSIS — E663 Overweight: Secondary | ICD-10-CM | POA: Diagnosis not present

## 2019-07-15 DIAGNOSIS — R1310 Dysphagia, unspecified: Secondary | ICD-10-CM | POA: Diagnosis not present

## 2019-07-15 MED ORDER — DEXILANT 30 MG PO CPDR: 30.0000 mg | DELAYED_RELEASE_CAPSULE | Freq: Two times a day (BID) | ORAL | 3 refills | Status: DC

## 2019-07-15 MED ORDER — BACLOFEN 10 MG PO TABS: 10.0000 mg | ORAL_TABLET | Freq: Every day | ORAL | 3 refills | Status: DC

## 2019-07-15 NOTE — Patient Instructions (Signed)
If you are age 48 or older, your body mass index should be between 23-30. Your Body mass index is 38.21 kg/m. If this is out of the aforementioned range listed, please consider follow up with your Primary Care Provider.  If you are age 51 or younger, your body mass index should be between 19-25. Your Body mass index is 38.21 kg/m. If this is out of the aformentioned range listed, please consider follow up with your Primary Care Provider.   To help prevent the possible spread of infection to our patients, communities, and staff; we will be implementing the following measures:  As of now we are not allowing any visitors/family members to accompany you to any upcoming appointments with [facility]. If you have any concerns about this please contact our office to discuss prior to the appointment.   We have sent the following medications to your pharmacy for you to pick up at your convenience: Dexilant 30mg : Take twice a day Baclofen 10mg : Take every night at bedtime  Thank you for entrusting me with your care and for choosing [facility], Dr.  Cellar

## 2019-07-15 NOTE — Progress Notes (Signed)
HPI :  48 year old male here for follow-up visit.  He has chronic GERD with nocturnal symptoms for which she has followed with [place].  He had an EGD in 2016 which was normal and he has no history of known Barrett's.  He has been maintained on Dexilant 30 mg once a day as well as baclofen nightly to help with his nocturnal symptoms.  In general the regimen is been working pretty well for him.  He will have breakthrough reflux about 2 out of 7 days/week, although in general he is fairly happy with it.  He does have some occasional rare dysphasia to solids.  No dysphasia to liquids.  He notes he had an EGD in 2013 and had a mild GJ stricture which was dilated at the time and relieved his dysphasia at the time.  He states this will happen rarely and is not a frequent occurrence for him at this time.  He denies any nausea or vomiting or eating problems otherwise.  He has gained about 6 or 7 pounds since the coronavirus outbreak.  We discussed alternative therapies for reflux.  Otherwise denies any problems with his bowels that are bothering him.  He had a small adenoma removed in 2017.,  His colonoscopy is up-to-date  Endoscopic history: EGD 05/26/15 - normal exam EGD 02/06/12 - ? GEJ mild ring, dilated to 30mm Savory, he reported benefit Colonoscopy 03/18/06 - mild rectal erythema with nonspecific inflammation, hemorrhoids Colonoscopy 03/27/16 - sigmoid diverticulosis, internal hemorrhoids, 25mm cecal polyp - TA - recall 03/2021    Past Medical History:  Diagnosis Date  . Esophageal stricture   . GERD (gastroesophageal reflux disease)   . History of kidney stones   . Hypertension      Past Surgical History:  Procedure Laterality Date  . COLONOSCOPY  03-16-06   per Dr. Deatra Ina, nonspecific colitis and int. hemorrhoids  . CT CORONARY CA SCORING  09-19-11   for chest pains, negative   . KNEE ARTHROPLASTY Left    Family History  Problem Relation Age of Onset  . Colon cancer Maternal Aunt   .  Pancreatic cancer Maternal Grandmother   . Crohn's disease Other        niece  . Diabetes Brother        MOTHER,AUNT  . Heart disease Mother   . Kidney disease Mother   . Prostate cancer Maternal Grandfather   . Esophageal cancer Neg Hx   . Rectal cancer Neg Hx   . Stomach cancer Neg Hx    Social History   Tobacco Use  . Smoking status: Never Smoker  . Smokeless tobacco: Never Used  Substance Use Topics  . Alcohol use: No    Alcohol/week: 0.0 standard drinks  . Drug use: No   Current Outpatient Medications  Medication Sig Dispense Refill  . amLODipine (NORVASC) 10 MG tablet TAKE 1 TABLET BY MOUTH EVERY DAY 90 tablet 1  . baclofen (LIORESAL) 10 MG tablet TAKE 2 TABLETS BY MOUTH AT BEDTIME 180 tablet 3  . buPROPion (WELLBUTRIN XL) 150 MG 24 hr tablet TAKE 1 TABLET BY MOUTH DAILY 30 tablet 5  . Dexlansoprazole (DEXILANT) 30 MG capsule TAKE 1 CAPSULE(30 MG) BY MOUTH DAILY 90 capsule 1  . LORazepam (ATIVAN) 0.5 MG tablet TAKE 1 TABLET BY MOUTH EVERY 8 HOURS AS NEEDED FOR ANXIETY 90 tablet 0  . losartan (COZAAR) 100 MG tablet TAKE 1 TABLET(100 MG) BY MOUTH DAILY 90 tablet 3  . meloxicam (MOBIC) 15 MG tablet  Take 1 tablet (15 mg total) by mouth daily. 30 tablet 0  . methocarbamol (ROBAXIN-750) 750 MG tablet Take 1 tablet (750 mg total) by mouth every 6 (six) hours as needed for muscle spasms. 60 tablet 2  . metoprolol succinate (TOPROL-XL) 100 MG 24 hr tablet Take 1 tablet (100 mg total) by mouth daily. Take with or immediately following a meal. 90 tablet 3  . potassium chloride SA (K-DUR,KLOR-CON) 20 MEQ tablet Take 1 tablet (20 mEq total) by mouth 3 (three) times daily. 270 tablet 3   No current facility-administered medications for this visit.    No Known Allergies   Review of Systems: All systems reviewed and negative except where noted in HPI.    Dg Foot Complete Left  Result Date: 07/08/2019 Please see detailed radiograph report in office note.  Dg Foot Complete Right   Result Date: 07/08/2019 Please see detailed radiograph report in office note.  Lab Results  Component Value Date   WBC 7.8 09/16/2018   HGB 14.8 09/16/2018   HCT 41.6 09/16/2018   MCV 89.3 09/16/2018   PLT 225.0 09/16/2018    Lab Results  Component Value Date   CREATININE 1.11 09/16/2018   BUN 8 09/16/2018   NA 142 09/16/2018   K 3.3 (L) 09/16/2018   CL 103 09/16/2018   CO2 31 09/16/2018     Physical Exam: BP (!) 152/104 (Cuff Size: Large)   Pulse 64   Temp 98.1 F (36.7 C) (Oral)   Ht 5' 8.5" (1.74 m)   Wt 255 lb (115.7 kg)   BMI 38.21 kg/m  Constitutional: Pleasant,well-developed, male in no acute distress. HEENT: Normocephalic and atraumatic. Conjunctivae are normal. No scleral icterus. Neck supple.  Cardiovascular: Normal rate, regular rhythm.  Pulmonary/chest: Effort normal and breath sounds normal. No wheezing, rales or rhonchi. Abdominal: Soft, nondistended, nontender. There are no masses palpable. No hepatomegaly. Extremities: no edema Lymphadenopathy: No cervical adenopathy noted. Neurological: Alert and oriented to person place and time. Skin: Skin is warm and dry. No rashes noted. Psychiatric: Normal mood and affect. Behavior is normal.   ASSESSMENT AND PLAN: 48 y/o male here for reassessment of the following issues:  GERD / Overweight / Dysphagia - as above, chronic symptoms, fairly well controlled with Dexilant 30 mg a day and baclofen to use at night for nocturnal symptoms.  He is limited by baclofen use during the day due to causing drowsiness, but he does think it is helps him sleep and provides good nocturnal control.  We discussed long-term risks and benefits of chronic PPI use.  He understands there are risks of this regimen, however his quality of life will be significantly worse and he thinks if he stops the regimen.  We discussed alternatives to medication for reflux including TIF and surgery.  Unfortunately his body mass index is too high to be  qualified for a TIF right now.  I counseled him that if he loses weight that may improve his reflux in itself, and if he can lose enough weight to reduce his body mass index he may be considered for a TIF at some point in time.  He is very interested in TIF and will work hard on weight loss.  Otherwise in regards to his dysphagia it is likely due to mild peptic stricture noted remotely to which he had benefit from dilation.  Symptoms are mild and not too bothersome right now, he declined an EGD when I offered this to him.  He will contact  me if symptoms recur moving forward.  Otherwise refilled his regimen, he can follow-up with me as needed for this issue or in 1 year for reassessment.   [place] Cellar, MD [facility]

## 2019-07-16 ENCOUNTER — Other Ambulatory Visit: Payer: 59 | Admitting: Orthotics

## 2019-07-22 ENCOUNTER — Ambulatory Visit (INDEPENDENT_AMBULATORY_CARE_PROVIDER_SITE_OTHER): Payer: 59 | Admitting: Orthotics

## 2019-07-22 ENCOUNTER — Other Ambulatory Visit: Payer: Self-pay

## 2019-07-22 DIAGNOSIS — M79672 Pain in left foot: Secondary | ICD-10-CM

## 2019-07-22 DIAGNOSIS — M79671 Pain in right foot: Secondary | ICD-10-CM

## 2019-07-22 DIAGNOSIS — M722 Plantar fascial fibromatosis: Secondary | ICD-10-CM

## 2019-07-22 NOTE — Progress Notes (Signed)

## 2019-08-06 ENCOUNTER — Other Ambulatory Visit: Payer: Self-pay | Admitting: Family Medicine

## 2019-08-07 ENCOUNTER — Encounter: Payer: Self-pay | Admitting: Podiatry

## 2019-08-07 ENCOUNTER — Other Ambulatory Visit: Payer: Self-pay

## 2019-08-07 ENCOUNTER — Ambulatory Visit: Payer: 59 | Admitting: Orthotics

## 2019-08-07 DIAGNOSIS — M722 Plantar fascial fibromatosis: Secondary | ICD-10-CM

## 2019-08-07 NOTE — Progress Notes (Signed)
Patient came in today to pick up custom made foot orthotics.  The goals were accomplished and the patient reported no dissatisfaction with said orthotics.  Patient was advised of breakin period and how to report any issues. 

## 2019-08-10 ENCOUNTER — Other Ambulatory Visit: Payer: Self-pay | Admitting: Family Medicine

## 2019-08-15 ENCOUNTER — Other Ambulatory Visit: Payer: Self-pay

## 2019-08-15 ENCOUNTER — Ambulatory Visit: Payer: 59 | Admitting: Adult Health

## 2019-08-15 ENCOUNTER — Ambulatory Visit: Payer: Self-pay

## 2019-08-15 ENCOUNTER — Encounter: Payer: Self-pay | Admitting: Adult Health

## 2019-08-15 VITALS — BP 166/106 | Temp 98.1°F | Wt 256.0 lb

## 2019-08-15 DIAGNOSIS — I10 Essential (primary) hypertension: Secondary | ICD-10-CM | POA: Diagnosis not present

## 2019-08-15 DIAGNOSIS — Z23 Encounter for immunization: Secondary | ICD-10-CM

## 2019-08-15 LAB — BASIC METABOLIC PANEL
BUN: 9 mg/dL (ref 6–23)
CO2: 31 mEq/L (ref 19–32)
Calcium: 9.2 mg/dL (ref 8.4–10.5)
Chloride: 103 mEq/L (ref 96–112)
Creatinine, Ser: 1.28 mg/dL (ref 0.40–1.50)
GFR: 72.39 mL/min (ref >60.00)
Glucose, Bld: 133 mg/dL — ABNORMAL HIGH (ref 70–99)
Potassium: 3.2 mEq/L — ABNORMAL LOW (ref 3.5–5.1)
Sodium: 142 mEq/L (ref 135–145)

## 2019-08-15 MED ORDER — HYDRALAZINE HCL 25 MG PO TABS: 25.0000 mg | ORAL_TABLET | Freq: Three times a day (TID) | ORAL | 0 refills | Status: DC

## 2019-08-15 NOTE — Progress Notes (Signed)
Subjective:    Patient ID: Andrew Sharp, male    DOB: 08-Oct-1971, 48 y.o.   MRN: [id number]  HPI 48 year old male who  has a past medical history of Esophageal stricture, GERD (gastroesophageal reflux disease), History of kidney stones, and Hypertension.  He is a patient of Dr. Sarajane Jews who is being seen today for elevated blood pressure readings. He is currently prescribed Norvasc 10 mg, Cozaar 100 mg, and  Toprol 100 mg daily.   He reports that over the last month his blood pressure has been elevated more than normal. He reports readings consistently in the 150-180/90-100's. He is symptomatic with intermittent headaches, blurred vision, dizziness, and fatigue. He takes Motrin and headaches resolve.   He is taking his medications as directed   BP Readings from Last 10 Encounters:  08/15/19 (!) 166/106  07/15/19 (!) 152/104  04/23/19 (!) 154/100  09/16/18 (!) 150/96  07/15/18 (!) 144/90  06/28/18 (!) 144/88  05/18/18 (!) 156/99  05/07/18 130/88  04/06/18 (!) 146/93  11/07/17 138/80   Review of Systems See HPI   Past Medical History:  Diagnosis Date  . Esophageal stricture   . GERD (gastroesophageal reflux disease)   . History of kidney stones   . Hypertension     Social History   Socioeconomic History  . Marital status: Married    Spouse name: Not on file  . Number of children: 1  . Years of education: Not on file  . Highest education level: Not on file  Occupational History  . Occupation: Best boy: [employer]   Social Needs  . Financial resource strain: Not on file  . Food insecurity    Worry: Not on file    Inability: Not on file  . Transportation needs    Medical: Not on file    Non-medical: Not on file  Tobacco Use  . Smoking status: Never Smoker  . Smokeless tobacco: Never Used  Substance and Sexual Activity  . Alcohol use: No    Alcohol/week: 0.0 standard drinks  . Drug use: No  . Sexual activity: Not on file  Lifestyle   . Physical activity    Days per week: Not on file    Minutes per session: Not on file  . Stress: Not on file  Relationships  . Social Herbalist on phone: Not on file    Gets together: Not on file    Attends religious service: Not on file    Active member of club or organization: Not on file    Attends meetings of clubs or organizations: Not on file    Relationship status: Not on file  . Intimate partner violence    Fear of current or ex partner: Not on file    Emotionally abused: Not on file    Physically abused: Not on file    Forced sexual activity: Not on file  Other Topics Concern  . Not on file  Social History Narrative  . Not on file    Past Surgical History:  Procedure Laterality Date  . COLONOSCOPY  03-16-06   per Dr. Deatra Ina, nonspecific colitis and int. hemorrhoids  . CT CORONARY CA SCORING  09-19-11   for chest pains, negative   . KNEE ARTHROPLASTY Left     Family History  Problem Relation Age of Onset  . Colon cancer Maternal Aunt   . Pancreatic cancer Maternal Grandmother   . Crohn's disease Other  niece  . Diabetes Brother        MOTHER,AUNT  . Heart disease Mother   . Kidney disease Mother   . Prostate cancer Maternal Grandfather   . Esophageal cancer Neg Hx   . Rectal cancer Neg Hx   . Stomach cancer Neg Hx     No Known Allergies  Current Outpatient Medications on File Prior to Visit  Medication Sig Dispense Refill  . amLODipine (NORVASC) 10 MG tablet TAKE 1 TABLET BY MOUTH EVERY DAY 90 tablet 1  . baclofen (LIORESAL) 10 MG tablet Take 1 tablet (10 mg total) by mouth at bedtime. 90 each 3  . buPROPion (WELLBUTRIN XL) 150 MG 24 hr tablet TAKE 1 TABLET BY MOUTH DAILY 30 tablet 5  . Dexlansoprazole (DEXILANT) 30 MG capsule Take 1 capsule (30 mg total) by mouth 2 (two) times daily. 180 capsule 3  . LORazepam (ATIVAN) 0.5 MG tablet TAKE 1 TABLET BY MOUTH EVERY 8 HOURS AS NEEDED FOR ANXIETY 90 tablet 0  . losartan (COZAAR) 100 MG  tablet TAKE 1 TABLET(100 MG) BY MOUTH DAILY 90 tablet 3  . meloxicam (MOBIC) 15 MG tablet Take 1 tablet (15 mg total) by mouth daily. 30 tablet 0  . methocarbamol (ROBAXIN-750) 750 MG tablet Take 1 tablet (750 mg total) by mouth every 6 (six) hours as needed for muscle spasms. 60 tablet 2  . metoprolol succinate (TOPROL-XL) 100 MG 24 hr tablet TAKE 1 TABLET BY MOUTH EVERY DAY WITH OR IMMEDIATELY FOLLOWING A MEAL 90 tablet 3  . potassium chloride SA (KLOR-CON) 20 MEQ tablet TAKE 1 TABLET(20 MEQ) BY MOUTH THREE TIMES DAILY 270 tablet 3   No current facility-administered medications on file prior to visit.     BP (!) 166/106   Temp 98.1 F (36.7 C)   Wt 256 lb (116.1 kg)   BMI 38.36 kg/m       Objective:   Physical Exam Vitals signs and nursing note reviewed.  Constitutional:      Appearance: Normal appearance.  Eyes:     Extraocular Movements: Extraocular movements intact.     Pupils: Pupils are equal, round, and reactive to light.  Cardiovascular:     Rate and Rhythm: Normal rate and regular rhythm.     Pulses: Normal pulses.     Heart sounds: Normal heart sounds.  Pulmonary:     Effort: Pulmonary effort is normal.     Breath sounds: Normal breath sounds.  Neurological:     Mental Status: He is alert.        Assessment & Plan:  1. Essential hypertension - Will check BMP and add Hydralazine. Continue to monitor BP at home. Follow up with PCP in two weeks or sooner if needed - EKG 12-Lead- Sinus  Rhythm  -  Nonspecific T-abnormality. Rate 66. Consistent with previous EKG - Basic Metabolic Panel - hydrALAZINE (APRESOLINE) 25 MG tablet; Take 1 tablet (25 mg total) by mouth 3 (three) times daily.  Dispense: 90 tablet; Refill: 0   Dorothyann Peng, NP

## 2019-08-15 NOTE — Patient Instructions (Signed)
It was great meeting you today   Your EKG is normal   I am going to have you stop by the lab and get some blood work done.   I have also added a medication called Hydralazine to your medication regimen. Please continue to monitor your blood pressure at home and follow up with Dr. Sarajane Jews in two weeks

## 2019-08-15 NOTE — Telephone Encounter (Signed)
Pt. Called to request an appt.  Reported has had elevated BP readings over past 2 weeks.  This AM reported BP's of 165/101 and 159/101 at approx. one hour ago.  Stated the readings have been consistently running in the 160/100 range x 2 weeks.  C/o intermittent headache that is relieved with Ibuprofen.  C/o intermittent dizziness.  C/o intermittent blurred vision.  Denied dizziness or blurred vision at this time.  Denied speech difficulty, facial droop, weakness in extremities, balance issues, chest pain, or shortness of breath.    Transferred pt. To the office to schedule an appt. Today.  Pt. Agreed with plan.    Reason for Disposition . Systolic BP  >= 0000000 OR Diastolic >= 123XX123    Pt. C/o elevated BP readings in 160/100 range over past 2 weeks.  C/o headache, intermittent dizziness and intermittent blurred vision.  Answer Assessment - Initial Assessment Questions 1. BLOOD PRESSURE: "What is the blood pressure?" "Did you take at least two measurements 5 minutes apart?"     165/101 @ 8:00 AM; BP 159/101 @ 8:00 AM  2. ONSET: "When did you take your blood pressure?"    For about 2 weeks 3. HOW: "How did you obtain the blood pressure?" (e.g., visiting nurse, automatic home BP monitor)     Manual  4. HISTORY: "Do you have a history of high blood pressure?"     Yes  5. MEDICATIONS: "Are you taking any medications for blood pressure?" "Have you missed any doses recently?"     Denied  6. OTHER SYMPTOMS: "Do you have any symptoms?" (e.g., headache, chest pain, blurred vision, difficulty breathing, weakness)     Headache that comes and goes; tension pain in post neck; c/o intermittent dizziness; blurry vision that comes and goes; denies speech difficulty, facial droop, or weakness in extremities, or balance issues ; denied chest pain or shortness of breath  7. PREGNANCY: "Is there any chance you are pregnant?" "When was your last menstrual period?"     N/a  Protocols used: HIGH BLOOD PRESSURE-A-AH

## 2019-08-15 NOTE — Telephone Encounter (Signed)
Noted  

## 2019-08-15 NOTE — Addendum Note (Signed)
Addended by: Miles Costain T on: 08/15/2019 03:28 PM   Modules accepted: Orders

## 2019-09-09 ENCOUNTER — Other Ambulatory Visit: Payer: Self-pay | Admitting: Family Medicine

## 2019-09-17 ENCOUNTER — Other Ambulatory Visit: Payer: Self-pay | Admitting: Family Medicine

## 2019-09-17 DIAGNOSIS — I10 Essential (primary) hypertension: Secondary | ICD-10-CM

## 2019-09-17 NOTE — Telephone Encounter (Signed)
Forwarding medication refill request to provider for review. 

## 2019-09-17 NOTE — Telephone Encounter (Signed)
hydrALAZINE (APRESOLINE) 25 MG tablet  [id number] - [place], [place] - [id number] AT [place] [phone] (Phone) [phone] (Fax)   pls refill for pt last visit 10/23

## 2019-09-22 NOTE — Telephone Encounter (Signed)
Patient is calling again regarding his refill request.  Pharmacy said they still have no heard from the doctor.  Please advise.

## 2019-09-23 MED ORDER — HYDRALAZINE HCL 25 MG PO TABS: 25.0000 mg | ORAL_TABLET | Freq: Three times a day (TID) | ORAL | 3 refills | Status: DC

## 2019-09-23 NOTE — Telephone Encounter (Signed)
This is your patient that I saw. I started him on low dose hydralazine and he was advised to follow up with you. He is asking for refill of hydralazine

## 2019-10-13 ENCOUNTER — Encounter (HOSPITAL_COMMUNITY): Payer: Self-pay | Admitting: Emergency Medicine

## 2019-10-13 ENCOUNTER — Emergency Department (HOSPITAL_COMMUNITY): Payer: 59

## 2019-10-13 ENCOUNTER — Encounter: Payer: Self-pay | Admitting: Family Medicine

## 2019-10-13 ENCOUNTER — Ambulatory Visit: Payer: Self-pay

## 2019-10-13 ENCOUNTER — Emergency Department (HOSPITAL_COMMUNITY)
Admission: EM | Admit: 2019-10-13 | Discharge: 2019-10-13 | Disposition: A | Discharge status: Home / Self Care | Payer: 59 | Attending: Emergency Medicine | Admitting: Emergency Medicine

## 2019-10-13 ENCOUNTER — Other Ambulatory Visit: Payer: Self-pay

## 2019-10-13 DIAGNOSIS — R519 Headache, unspecified: Secondary | ICD-10-CM | POA: Diagnosis not present

## 2019-10-13 DIAGNOSIS — R079 Chest pain, unspecified: Secondary | ICD-10-CM

## 2019-10-13 DIAGNOSIS — H538 Other visual disturbances: Secondary | ICD-10-CM | POA: Diagnosis not present

## 2019-10-13 DIAGNOSIS — I10 Essential (primary) hypertension: Secondary | ICD-10-CM | POA: Diagnosis present

## 2019-10-13 LAB — CBC
HCT: 42.1 % (ref 39.0–52.0)
Hemoglobin: 15 g/dL (ref 13.0–17.0)
MCH: 31.5 pg (ref 26.0–34.0)
MCHC: 35.6 g/dL (ref 30.0–36.0)
MCV: 88.4 fL (ref 80.0–100.0)
Platelets: 265 10*3/uL (ref 150–400)
RBC: 4.76 MIL/uL (ref 4.22–5.81)
RDW: 13 % (ref 11.5–15.5)
WBC: 10.2 10*3/uL (ref 4.0–10.5)
nRBC: 0 % (ref 0.0–0.2)

## 2019-10-13 LAB — TROPONIN I (HIGH SENSITIVITY)
Troponin I (High Sensitivity): 6 ng/L (ref <18)
Troponin I (High Sensitivity): 8 ng/L (ref <18)

## 2019-10-13 LAB — BASIC METABOLIC PANEL
Anion gap: 10 (ref 5–15)
BUN: 7 mg/dL (ref 6–20)
CO2: 23 mmol/L (ref 22–32)
Calcium: 9.3 mg/dL (ref 8.9–10.3)
Chloride: 105 mmol/L (ref 98–111)
Creatinine, Ser: 1.25 mg/dL — ABNORMAL HIGH (ref 0.61–1.24)
GFR calc Af Amer: >60 mL/min (ref >60)
GFR calc non Af Amer: >60 mL/min (ref >60)
Glucose, Bld: 116 mg/dL — ABNORMAL HIGH (ref 70–99)
Potassium: 3.4 mmol/L — ABNORMAL LOW (ref 3.5–5.1)
Sodium: 138 mmol/L (ref 135–145)

## 2019-10-13 MED ORDER — ACETAMINOPHEN 500 MG PO TABS
1000.0000 mg | ORAL_TABLET | Freq: Once | ORAL | Status: AC
  Administered 2019-10-13: 1000 mg via ORAL
  Filled 2019-10-13: qty 2

## 2019-10-13 NOTE — Telephone Encounter (Signed)
Message Routed to PCP CMA 

## 2019-10-13 NOTE — ED Notes (Signed)
Called lab to add Trop to current sample.

## 2019-10-13 NOTE — Telephone Encounter (Signed)
Patient called stating that his BP has been consistently high. Today 176/101. He is at work and unable to check his BP again for me. He states he has HA and blurry vision. He has a lot of neck tension. He has not missed any medication.  Per protocol patient will go to ER for evaluation. Care advice read to patient he verbalized understanding of all information.  Reason for Disposition . [id number] Systolic BP  >= 0000000 OR Diastolic >= [id number] AND [id number] cardiac or neurologic symptoms (e.g., chest pain, difficulty breathing, unsteady gait, blurred vision)  Answer Assessment - Initial Assessment Questions 1. BLOOD PRESSURE: "What is the blood pressure?" "Did you take at least two measurements 5 minutes apart?"     176/101,  2. ONSET: "When did you take your blood pressure?"     This am 3. HOW: "How did you obtain the blood pressure?" (e.g., visiting nurse, automatic home BP monitor)    Home BP monitor 4. HISTORY: "Do you have a history of high blood pressure?"     yes 5. MEDICATIONS: "Are you taking any medications for blood pressure?" "Have you missed any doses recently?"     No but recently seen and placed on medication 6. OTHER SYMPTOMS: "Do you have any symptoms?" (e.g., headache, chest pain, blurred vision, difficulty breathing, weakness)     Headache, blurry vision, 7. PREGNANCY: "Is there any chance you are pregnant?" "When was your last menstrual period?"   N/A  Protocols used: HIGH BLOOD PRESSURE-A-AH

## 2019-10-13 NOTE — Telephone Encounter (Signed)
Will monitor for ED arrival.  

## 2019-10-13 NOTE — ED Provider Notes (Signed)
[facility] Emergency Department Provider Note MRN:  [id number]  Arrival date & time: 10/13/19     Chief Complaint   Hypertension and Blurred Vision   History of Present Illness   Andrew Sharp is a 48 y.o. year-old male with a history of hypertension presenting to the ED with chief complaint of hypertension and blurred vision.  For the past 2 months patient has been experiencing intermittent blurred vision and headaches.  Headaches are located in the back of the head, not changed with bright lights or loud noises, mild to moderate in severity.  Denies nausea or vomiting.  Also concerned about his blood pressure which was elevated this morning.  Denies numbness or weakness to the arms or legs, endorses a brief episode of chest pain yesterday while watching football.  Denies shortness of breath, no dizziness or diaphoresis, no abdominal pain.  Review of Systems  A complete 10 system review of systems was obtained and all systems are negative except as noted in the HPI and PMH.   Patient's Health History    Past Medical History:  Diagnosis Date  . Esophageal stricture   . GERD (gastroesophageal reflux disease)   . History of kidney stones   . Hypertension     Past Surgical History:  Procedure Laterality Date  . COLONOSCOPY  03-16-06   per Dr. Deatra Ina, nonspecific colitis and int. hemorrhoids  . CT CORONARY CA SCORING  09-19-11   for chest pains, negative   . KNEE ARTHROPLASTY Left     Family History  Problem Relation Age of Onset  . Colon cancer Maternal Aunt   . Pancreatic cancer Maternal Grandmother   . Crohn's disease Other        niece  . Diabetes Brother        MOTHER,AUNT  . Heart disease Mother   . Kidney disease Mother   . Prostate cancer Maternal Grandfather   . Esophageal cancer Neg Hx   . Rectal cancer Neg Hx   . Stomach cancer Neg Hx     Social History   Socioeconomic History  . Marital status: Married    Spouse name: Not on  file  . Number of children: 1  . Years of education: Not on file  . Highest education level: Not on file  Occupational History  . Occupation: MANAGER    Employer: [employer]   Tobacco Use  . Smoking status: Never Smoker  . Smokeless tobacco: Never Used  Substance and Sexual Activity  . Alcohol use: No    Alcohol/week: 0.0 standard drinks  . Drug use: No  . Sexual activity: Not on file  Other Topics Concern  . Not on file  Social History Narrative  . Not on file   Social Determinants of Health   Financial Resource Strain:   . Difficulty of Paying Living Expenses: Not on file  Food Insecurity:   . Worried About Charity fundraiser in the Last Year: Not on file  . Ran Out of Food in the Last Year: Not on file  Transportation Needs:   . Lack of Transportation (Medical): Not on file  . Lack of Transportation (Non-Medical): Not on file  Physical Activity:   . Days of Exercise per Week: Not on file  . Minutes of Exercise per Session: Not on file  Stress:   . Feeling of Stress : Not on file  Social Connections:   . Frequency of Communication with Friends and Family: Not on  file  . Frequency of Social Gatherings with Friends and Family: Not on file  . Attends Religious Services: Not on file  . Active Member of Clubs or Organizations: Not on file  . Attends Archivist Meetings: Not on file  . Marital Status: Not on file  Intimate Partner Violence:   . Fear of Current or Ex-Partner: Not on file  . Emotionally Abused: Not on file  . Physically Abused: Not on file  . Sexually Abused: Not on file     Physical Exam  Vital Signs and Nursing Notes reviewed Vitals:   10/13/19 1900 10/13/19 1930  BP: 127/80 (!) 149/87  Pulse: 87 85  Resp: 20 20  Temp:    SpO2: 96% 97%    CONSTITUTIONAL: Well-appearing, NAD NEURO:  Alert and oriented x 3, normal and symmetric strength and sensation, normal coordination, normal speech, no aphasia, no neglect, no visual  field cuts EYES:  eyes equal and reactive ENT/NECK:  no LAD, no JVD CARDIO: Regular rate, well-perfused, normal S1 and S2 PULM:  CTAB no wheezing or rhonchi GI/GU:  normal bowel sounds, non-distended, non-tender MSK/SPINE:  No gross deformities, no edema SKIN:  no rash, atraumatic PSYCH:  Appropriate speech and behavior  Diagnostic and Interventional Summary    EKG Interpretation  Date/Time:  Monday October 13 2019 15:54:38 EST Ventricular Rate:  93 PR Interval:    QRS Duration: 92 QT Interval:  357 QTC Calculation: 444 R Axis:   132 Text Interpretation: Sinus rhythm Abnormal ekg No significant change was found Confirmed by Gerlene Fee [phone]) on 10/13/2019 4:02:03 PM      Labs Reviewed  BASIC METABOLIC PANEL - Abnormal; Notable for the following components:      Result Value   Potassium 3.4 (*)    Glucose, Bld 116 (*)    Creatinine, Ser 1.25 (*)    All other components within normal limits  CBC  TROPONIN I (HIGH SENSITIVITY)  TROPONIN I (HIGH SENSITIVITY)    CT Head Wo Contrast  Final Result    XR Chest Single View  Final Result      Medications  acetaminophen (TYLENOL) tablet 1,000 mg (1,000 mg Oral Given 10/13/19 1943)     Procedures  /  Critical Care Procedures  ED Course and Medical Decision Making  I have reviewed the triage vital signs and the nursing notes.  Pertinent labs & imaging results that were available during my care of the patient were reviewed by me and considered in my medical decision making (see below for details).     Chronic headaches and blurred vision for 2 months, here today because of high blood pressure.  Hypertensive on arrival but has corrected to 140s over 80s on my assessment.  Reassuring neurological exam.  EKG showing T wave abnormalities but unchanged from prior, no chest pain currently but did have chest pain yesterday.  Screening with troponin, BMP, also with CT head to exclude mass.  Labs reassuring, no evidence of  endorgan dysfunction, CT head normal.  Blood pressure continues to be within acceptable limits, 140s over 80s.  Appropriate for discharge with close PCP follow-up.  Barth Kirks. Sedonia Small, [place] [email or web address]  Final Clinical Impressions(s) / ED Diagnoses     ICD-10-CM   1. Chronic nonintractable headache, unspecified headache type  R51.9    G89.29   2. Chest pain  R07.9 XR Chest Single View    XR Chest Single View  ED Discharge Orders    None       Discharge Instructions Discussed with and Provided to Patient:     Discharge Instructions     You were evaluated in the Emergency Department and after careful evaluation, we did not find any emergent condition requiring admission or further testing in the hospital.  Your exam/testing today is overall reassuring.  Please follow-up with your primary care doctor to discuss your headaches and your high blood pressure.  Please return to the Emergency Department if you experience any worsening of your condition.  We encourage you to follow up with a primary care provider.  Thank you for allowing [place] to be a part of your care.       Maudie Flakes, MD 10/13/19 [phone]

## 2019-10-13 NOTE — ED Triage Notes (Signed)
Pt reports hypertension, blurred vision and headache. Recently added on more BP medications by PCP. Blurred vision has been going on for weeks but the HA started this weekend.

## 2019-10-13 NOTE — Discharge Instructions (Addendum)
You were evaluated in the Emergency Department and after careful evaluation, we did not find any emergent condition requiring admission or further testing in the hospital.  Your exam/testing today is overall reassuring.  Please follow-up with your primary care doctor to discuss your headaches and your high blood pressure.  Please return to the Emergency Department if you experience any worsening of your condition.  We encourage you to follow up with a primary care provider.  Thank you for allowing [place] to be a part of your care.

## 2019-10-13 NOTE — ED Notes (Signed)
Returned from CT.

## 2019-10-13 NOTE — Telephone Encounter (Signed)
Noted will send to Dr. Sarajane Jews as Juluis Rainier

## 2019-10-14 NOTE — Telephone Encounter (Signed)
Set up an in person OV  

## 2019-10-27 ENCOUNTER — Other Ambulatory Visit: Payer: Self-pay

## 2019-10-27 ENCOUNTER — Encounter: Payer: Self-pay | Admitting: Family Medicine

## 2019-10-27 ENCOUNTER — Ambulatory Visit: Payer: 59 | Admitting: Family Medicine

## 2019-10-27 VITALS — BP 160/90 | HR 72 | Temp 97.8°F | Wt 238.4 lb

## 2019-10-27 DIAGNOSIS — I10 Essential (primary) hypertension: Secondary | ICD-10-CM | POA: Diagnosis not present

## 2019-10-27 MED ORDER — HYDRALAZINE HCL 50 MG PO TABS: 50.0000 mg | ORAL_TABLET | Freq: Three times a day (TID) | ORAL | 5 refills | Status: DC

## 2019-10-27 NOTE — Progress Notes (Signed)
   Subjective:    Patient ID: Andrew Sharp, male    DOB: 1971-02-20, 49 y.o.   MRN: [id number]  HPI Here to follow up an ER visit on 10-13-19 for 3 weeks of headaches and blurred vision, then with an episode of chest pain while he was watching a football game. He states that his BP at home has been running high, often in the 160s and 170s over 100 range. At the ER his labs were normal, including cardiac enzymes. An EKG was normal, as was a head CT. He was told to follow up with [place] but no changes were made to his medications. Since then he has felt better but simply has some fatigue.    Review of Systems  Constitutional: Positive for fatigue.  Respiratory: Negative.   Cardiovascular: Negative.   Neurological: Negative.        Objective:   Physical Exam Constitutional:      General: He is not in acute distress.    Appearance: Normal appearance.  Cardiovascular:     Rate and Rhythm: Normal rate and regular rhythm.     Pulses: Normal pulses.     Heart sounds: Normal heart sounds.  Pulmonary:     Effort: Pulmonary effort is normal.     Breath sounds: Normal breath sounds.  Musculoskeletal:     Right lower leg: No edema.     Left lower leg: No edema.  Neurological:     General: No focal deficit present.     Mental Status: He is alert and oriented to person, place, and time.           Assessment & Plan:  HTN, we will increase the Hydralazine to 50 mg TID. Recheck in 3-4 weeks.  Alysia Penna, MD

## 2019-12-08 ENCOUNTER — Other Ambulatory Visit: Payer: Self-pay | Admitting: Family Medicine

## 2019-12-15 ENCOUNTER — Other Ambulatory Visit: Payer: Self-pay

## 2019-12-15 NOTE — Progress Notes (Unsigned)
error 

## 2019-12-16 ENCOUNTER — Other Ambulatory Visit: Payer: Self-pay | Admitting: Orthopedic Surgery

## 2019-12-16 ENCOUNTER — Other Ambulatory Visit: Payer: Self-pay | Admitting: Family Medicine

## 2019-12-16 ENCOUNTER — Encounter: Payer: Self-pay | Admitting: Family Medicine

## 2019-12-16 DIAGNOSIS — M25521 Pain in right elbow: Secondary | ICD-10-CM

## 2019-12-16 MED ORDER — CLONIDINE HCL 0.1 MG PO TABS: 0.1000 mg | ORAL_TABLET | Freq: Two times a day (BID) | ORAL | 2 refills | Status: DC

## 2019-12-16 NOTE — Telephone Encounter (Signed)
Stay on his current meds, but add Clonidine 0.1 mg to take BID. Call in #[phone] with 2 rf. Report back in 2 weeks

## 2019-12-17 MED ORDER — LORAZEPAM 0.5 MG PO TABS: 0.5000 mg | ORAL_TABLET | Freq: Three times a day (TID) | ORAL | 0 refills | Status: DC | PRN

## 2020-01-01 ENCOUNTER — Other Ambulatory Visit: Payer: 59

## 2020-01-12 ENCOUNTER — Ambulatory Visit (HOSPITAL_COMMUNITY)
Admission: RE | Admit: 2020-01-12 | Discharge: 2020-01-12 | Disposition: A | Discharge status: Home / Self Care | Payer: 59 | Source: Ambulatory Visit | Attending: Pulmonary Disease | Admitting: Pulmonary Disease

## 2020-01-12 ENCOUNTER — Telehealth: Payer: Self-pay | Admitting: Unknown Physician Specialty

## 2020-01-12 ENCOUNTER — Other Ambulatory Visit: Payer: Self-pay | Admitting: Unknown Physician Specialty

## 2020-01-12 DIAGNOSIS — U071 COVID-19: Secondary | ICD-10-CM

## 2020-01-12 MED ORDER — FAMOTIDINE IN NACL 20-0.9 MG/50ML-% IV SOLN: 20.0000 mg | Freq: Once | INTRAVENOUS | Status: DC | PRN

## 2020-01-12 MED ORDER — ALBUTEROL SULFATE HFA 108 (90 BASE) MCG/ACT IN AERS: 2.0000 | INHALATION_SPRAY | Freq: Once | RESPIRATORY_TRACT | Status: DC | PRN

## 2020-01-12 MED ORDER — DIPHENHYDRAMINE HCL 50 MG/ML IJ SOLN: 50.0000 mg | Freq: Once | INTRAMUSCULAR | Status: DC | PRN

## 2020-01-12 MED ORDER — SODIUM CHLORIDE 0.9 % IV SOLN
INTRAVENOUS | Status: DC | PRN
  Administered 2020-01-12: 250 mL via INTRAVENOUS

## 2020-01-12 MED ORDER — SODIUM CHLORIDE 0.9 % IV SOLN
700.0000 mg | Freq: Once | INTRAVENOUS | Status: AC
  Administered 2020-01-12: 700 mg via INTRAVENOUS
  Filled 2020-01-12: qty 700

## 2020-01-12 MED ORDER — EPINEPHRINE 0.3 MG/0.3ML IJ SOAJ: 0.3000 mg | Freq: Once | INTRAMUSCULAR | Status: DC | PRN

## 2020-01-12 MED ORDER — METHYLPREDNISOLONE SODIUM SUCC 125 MG IJ SOLR: 125.0000 mg | Freq: Once | INTRAMUSCULAR | Status: DC | PRN

## 2020-01-12 NOTE — Telephone Encounter (Signed)
  I connected by phone with Andrew Sharp on 01/12/2020 at 9:02 AM to discuss the potential use of an new treatment for mild to moderate COVID-19 viral infection in non-hospitalized patients.  This patient is a 49 y.o. male that meets the FDA criteria for Emergency Use Authorization of bamlanivimab or casirivimab\imdevimab.  Has a (+) direct SARS-CoV-2 viral test result  Has mild or moderate COVID-19   Is ? 49 years of age and weighs ? 40 kg  Is NOT hospitalized due to COVID-19  Is NOT requiring oxygen therapy or requiring an increase in baseline oxygen flow rate due to COVID-19  Is within 10 days of symptom onset  Has at least one of the high risk factor(s) for progression to severe COVID-19 and/or hospitalization as defined in EUA.  Specific high risk criteria : BMI >/= 35   I have spoken and communicated the following to the patient or parent/caregiver:  1. FDA has authorized the emergency use of bamlanivimab and casirivimab\imdevimab for the treatment of mild to moderate COVID-19 in adults and pediatric patients with positive results of direct SARS-CoV-2 viral testing who are 39 years of age and older weighing at least 40 kg, and who are at high risk for progressing to severe COVID-19 and/or hospitalization.  2. The significant known and potential risks and benefits of bamlanivimab and casirivimab\imdevimab, and the extent to which such potential risks and benefits are unknown.  3. Information on available alternative treatments and the risks and benefits of those alternatives, including clinical trials.  4. Patients treated with bamlanivimab and casirivimab\imdevimab should continue to self-isolate and use infection control measures (e.g., wear mask, isolate, social distance, avoid sharing personal items, clean and disinfect "high touch" surfaces, and frequent handwashing) according to CDC guidelines.   5. The patient or parent/caregiver has the option to accept or refuse  bamlanivimab or casirivimab\imdevimab .  After reviewing this information with the patient, The patient agreed to proceed with receiving the bamlanimivab infusion and will be provided a copy of the Fact sheet prior to receiving the infusion.[name] 01/12/2020 9:02 AM Day 8 of illness

## 2020-01-12 NOTE — Progress Notes (Signed)
  Diagnosis: COVID-19  Physician: dr. Joya Gaskins  Procedure: [facility] Med: bamlanivimab infusion - Provided patient with bamlanimivab fact sheet for patients, parents and caregivers prior to infusion.  Complications: No immediate complications noted.  Discharge: Discharged home   Andrew Sharp 01/12/2020

## 2020-01-12 NOTE — Discharge Instructions (Signed)
10 Things You Can Do to Manage Your COVID-19 Symptoms at Home If you have possible or confirmed COVID-19: 1. Stay home from work and school. And stay away from other public places. If you must go out, avoid using any kind of public transportation, ridesharing, or taxis. 2. Monitor your symptoms carefully. If your symptoms get worse, call your healthcare provider immediately. 3. Get rest and stay hydrated. 4. If you have a medical appointment, call the healthcare provider ahead of time and tell them that you have or may have COVID-19. 5. For medical emergencies, call 911 and notify the dispatch personnel that you have or may have COVID-19. 6. Cover your cough and sneezes with a tissue or use the inside of your elbow. 7. Wash your hands often with soap and water for at least 20 seconds or clean your hands with an alcohol-based hand sanitizer that contains at least 60% alcohol. 8. As much as possible, stay in a specific room and away from other people in your home. Also, you should use a separate bathroom, if available. If you need to be around other people in or outside of the home, wear a mask. 9. Avoid sharing personal items with other people in your household, like dishes, towels, and bedding. 10. Clean all surfaces that are touched often, like counters, tabletops, and doorknobs. Use household cleaning sprays or wipes according to the label instructions. [email or web address] What types of side effects do monoclonal antibody drugs cause?  Common side effects  In general, the more common side effects caused by monoclonal antibody drugs include: . Allergic reactions, such as hives or itching . Flu-like signs and symptoms, including chills, fatigue, fever, and muscle aches and pains . Nausea, vomiting . Diarrhea . Skin rashes . Low blood pressure   The CDC is recommending patients who receive monoclonal antibody treatments wait at least 90 days before being vaccinated.  Currently, there are no  data on the safety and efficacy of mRNA COVID-19 vaccines in persons who received monoclonal antibodies or convalescent plasma as part of COVID-19 treatment. Based on the estimated half-life of such therapies as well as evidence suggesting that reinfection is uncommon in the 90 days after initial infection, vaccination should be deferred for at least 90 days, as a precautionary measure until additional information becomes available, to avoid interference of the antibody treatment with vaccine-induced immune responses.

## 2020-01-13 ENCOUNTER — Inpatient Hospital Stay: Admission: RE | Admit: 2020-01-13 | Payer: 59 | Source: Ambulatory Visit

## 2020-01-15 ENCOUNTER — Telehealth (INDEPENDENT_AMBULATORY_CARE_PROVIDER_SITE_OTHER): Payer: 59 | Admitting: Family Medicine

## 2020-01-15 ENCOUNTER — Other Ambulatory Visit: Payer: Self-pay

## 2020-01-15 ENCOUNTER — Encounter: Payer: Self-pay | Admitting: Family Medicine

## 2020-01-15 VITALS — Ht 69.0 in | Wt 238.0 lb

## 2020-01-15 DIAGNOSIS — U071 COVID-19: Secondary | ICD-10-CM | POA: Diagnosis not present

## 2020-01-15 MED ORDER — ONDANSETRON HCL 8 MG PO TABS: 8.0000 mg | ORAL_TABLET | Freq: Four times a day (QID) | ORAL | 0 refills | Status: DC | PRN

## 2020-01-15 NOTE — Progress Notes (Signed)
Virtual Visit via Video Note  I connected with the patient on 01/15/20 at 10:45 AM EDT by a video enabled telemedicine application and verified that I am speaking with the correct person using two identifiers.  Location patient: home Location provider:work or home office Persons participating in the virtual visit: patient, provider  I discussed the limitations of evaluation and management by telemedicine and the availability of in person appointments. The patient expressed understanding and agreed to proceed.   HPI: Here for one week of nausea and vomiting. On 01-05-20 he developed body aches, nausea ,and loss of taste and smell. No fever or cough or SOB. On 01-06-20 he tested positive for the Covid-19 virus at his [facility]. On 01-12-20 he received the Bamlanivimab infusion at the [facility] site. Since then the body aches have resolved and his taste and smell have returned, but he continues to have nausea and vomiting. No abdominal pain or diarrhea. He is drinking water but he cannot hold any food down.    ROS: See pertinent positives and negatives per HPI.  Past Medical History:  Diagnosis Date  . Esophageal stricture   . GERD (gastroesophageal reflux disease)   . History of kidney stones   . Hypertension     Past Surgical History:  Procedure Laterality Date  . COLONOSCOPY  03-16-06   per Dr. Deatra Ina, nonspecific colitis and int. hemorrhoids  . CT CORONARY CA SCORING  09-19-11   for chest pains, negative   . KNEE ARTHROPLASTY Left     Family History  Problem Relation Age of Onset  . Colon cancer Maternal Aunt   . Pancreatic cancer Maternal Grandmother   . Crohn's disease Other        niece  . Diabetes Brother        MOTHER,AUNT  . Heart disease Mother   . Kidney disease Mother   . Prostate cancer Maternal Grandfather   . Esophageal cancer Neg Hx   . Rectal cancer Neg Hx   . Stomach cancer Neg Hx      Current Outpatient Medications:  .  amLODipine (NORVASC) 10 MG tablet,  TAKE 1 TABLET BY MOUTH EVERY DAY, Disp: 90 tablet, Rfl: 1 .  baclofen (LIORESAL) 10 MG tablet, Take 1 tablet (10 mg total) by mouth at bedtime., Disp: 90 each, Rfl: 3 .  buPROPion (WELLBUTRIN XL) 150 MG 24 hr tablet, TAKE 1 TABLET BY MOUTH DAILY, Disp: 30 tablet, Rfl: 5 .  cloNIDine (CATAPRES) 0.1 MG tablet, Take 1 tablet (0.1 mg total) by mouth 2 (two) times daily., Disp: 60 tablet, Rfl: 2 .  Dexlansoprazole (DEXILANT) 30 MG capsule, Take 1 capsule (30 mg total) by mouth 2 (two) times daily., Disp: 180 capsule, Rfl: 3 .  hydrALAZINE (APRESOLINE) 50 MG tablet, Take 1 tablet (50 mg total) by mouth 3 (three) times daily., Disp: 90 tablet, Rfl: 5 .  LORazepam (ATIVAN) 0.5 MG tablet, Take 1 tablet (0.5 mg total) by mouth every 8 (eight) hours as needed. for anxiety, Disp: 90 tablet, Rfl: 0 .  losartan (COZAAR) 100 MG tablet, TAKE 1 TABLET(100 MG) BY MOUTH DAILY, Disp: 90 tablet, Rfl: 3 .  meloxicam (MOBIC) 15 MG tablet, Take 1 tablet (15 mg total) by mouth daily., Disp: 30 tablet, Rfl: 0 .  methocarbamol (ROBAXIN-750) 750 MG tablet, Take 1 tablet (750 mg total) by mouth every 6 (six) hours as needed for muscle spasms., Disp: 60 tablet, Rfl: 2 .  metoprolol succinate (TOPROL-XL) 100 MG 24 hr tablet, TAKE 1  TABLET BY MOUTH EVERY DAY WITH OR IMMEDIATELY FOLLOWING A MEAL, Disp: 90 tablet, Rfl: 3 .  potassium chloride SA (KLOR-CON) 20 MEQ tablet, TAKE 1 TABLET(20 MEQ) BY MOUTH THREE TIMES DAILY, Disp: 270 tablet, Rfl: 3 .  ondansetron (ZOFRAN) 8 MG tablet, Take 1 tablet (8 mg total) by mouth every 6 (six) hours as needed for nausea or vomiting., Disp: 60 tablet, Rfl: 0  EXAM:  VITALS per patient if applicable:  GENERAL: alert, oriented, appears well and in no acute distress  HEENT: atraumatic, conjunttiva clear, no obvious abnormalities on inspection of external nose and ears  NECK: normal movements of the head and neck  LUNGS: on inspection no signs of respiratory distress, breathing rate appears  normal, no obvious gross SOB, gasping or wheezing  CV: no obvious cyanosis  MS: moves all visible extremities without noticeable abnormality  PSYCH/NEURO: pleasant and cooperative, no obvious depression or anxiety, speech and thought processing grossly intact  ASSESSMENT AND PLAN: He is having GI symptoms from a Covid virus infection. He will try Zofran 8 mg as needed for the nausea. Hopefully he can then eat some food. We will write him out of work from 01-06-20 until 01-19-20.  Alysia Penna, MD  Discussed the following assessment and plan:  No diagnosis found.     I discussed the assessment and treatment plan with the patient. The patient was provided an opportunity to ask questions and all were answered. The patient agreed with the plan and demonstrated an understanding of the instructions.   The patient was advised to call back or seek an in-person evaluation if the symptoms worsen or if the condition fails to improve as anticipated.

## 2020-01-18 ENCOUNTER — Encounter: Payer: Self-pay | Admitting: Family Medicine

## 2020-01-20 MED ORDER — NYSTATIN 100000 UNIT/ML MT SUSP: OROMUCOSAL | 1 refills | Status: DC

## 2020-01-20 NOTE — Telephone Encounter (Signed)
That appears to be thrush on his tongue. This is a fungal infection in the mouth, and it is treatable. Call in Nystatin oral suspension to swish 5 cc in the mouth every 4 hours and swallow it for 5-7 days, 300 ml with one rf

## 2020-03-07 ENCOUNTER — Other Ambulatory Visit: Payer: Self-pay | Admitting: Family Medicine

## 2020-03-11 ENCOUNTER — Other Ambulatory Visit: Payer: Self-pay | Admitting: Family Medicine

## 2020-03-11 NOTE — Telephone Encounter (Signed)
Last filled 05/07/2018 Last OV 01/15/2020  Ok to fill?

## 2020-03-21 ENCOUNTER — Other Ambulatory Visit: Payer: Self-pay | Admitting: Family Medicine

## 2020-05-18 ENCOUNTER — Other Ambulatory Visit: Payer: Self-pay | Admitting: Family Medicine

## 2020-05-28 ENCOUNTER — Other Ambulatory Visit: Payer: Self-pay | Admitting: Family Medicine

## 2020-06-02 ENCOUNTER — Other Ambulatory Visit: Payer: Self-pay | Admitting: Family Medicine

## 2020-06-17 ENCOUNTER — Other Ambulatory Visit: Payer: Self-pay | Admitting: Family Medicine

## 2020-07-08 ENCOUNTER — Other Ambulatory Visit: Payer: Self-pay | Admitting: Family Medicine

## 2020-08-07 ENCOUNTER — Other Ambulatory Visit: Payer: Self-pay | Admitting: Gastroenterology

## 2020-08-12 ENCOUNTER — Other Ambulatory Visit: Payer: Self-pay | Admitting: Family Medicine

## 2020-08-16 ENCOUNTER — Other Ambulatory Visit: Payer: Self-pay | Admitting: Family Medicine

## 2020-09-12 ENCOUNTER — Other Ambulatory Visit: Payer: Self-pay | Admitting: Family Medicine

## 2020-09-14 ENCOUNTER — Encounter: Payer: Self-pay | Admitting: Nurse Practitioner

## 2020-09-14 ENCOUNTER — Ambulatory Visit: Payer: 59 | Admitting: Nurse Practitioner

## 2020-09-14 VITALS — BP 168/98 | HR 78 | Ht 69.0 in | Wt 257.0 lb

## 2020-09-14 DIAGNOSIS — K219 Gastro-esophageal reflux disease without esophagitis: Secondary | ICD-10-CM

## 2020-09-14 MED ORDER — DEXILANT 30 MG PO CPDR: 1.0000 | DELAYED_RELEASE_CAPSULE | Freq: Two times a day (BID) | ORAL | 11 refills | Status: DC

## 2020-09-14 MED ORDER — BACLOFEN 10 MG PO TABS: 10.0000 mg | ORAL_TABLET | Freq: Every day | ORAL | 3 refills | Status: DC

## 2020-09-14 NOTE — Patient Instructions (Signed)
Continue Baclofen every night at bedtime, we will send in your refills  Continue Dexilant   Follow up in 1 year  If you are age 49 or older, your body mass index should be between 23-30. Your Body mass index is 37.95 kg/m. If this is out of the aforementioned range listed, please consider follow up with your Primary Care Provider.  If you are age 61 or younger, your body mass index should be between 19-25. Your Body mass index is 37.95 kg/m. If this is out of the aformentioned range listed, please consider follow up with your Primary Care Provider.    Due to recent changes in healthcare laws, you may see the results of your imaging and laboratory studies on MyChart before your provider has had a chance to review them.  We understand that in some cases there may be results that are confusing or concerning to you. Not all laboratory results come back in the same time frame and the provider may be waiting for multiple results in order to interpret others.  Please give [place] 48 hours in order for your provider to thoroughly review all the results before contacting the office for clarification of your results.   I appreciate the  opportunity to care for you  Thank You   [place]

## 2020-09-14 NOTE — Progress Notes (Signed)
Agree with assessment and plan as outlined.  

## 2020-09-14 NOTE — Progress Notes (Signed)
ASSESSMENT AND PLAN     # Long standing GERD, maintained on Dexilant and Baclofen. No history of Barrett's esophagus --Needs refill on Baclefen 10 mg Q HS. , he is okay on Dexilant refills.  --Gets off work late so going to bed on empty stomach is tough. He consumes limited amount of caffeine intake. Sleeps elevated.   HISTORY OF PRESENT ILLNESS     Primary Gastroenterologist : [place] Cellar, MD  Chief Complaint : refill on medications.   Andrew Sharp is a 49 y.o. male with PMH / [place] significant for,  but not necessarily limited to: COVID infection, GERD, adenomatous colon polyps, HTN, kidney stones  Patient was last seen here September 2020 for management of chronic GERD.  No history of Barrett's esophagus. Taking Dexilant in am and Baclofen in evening.  For the most part GERD symptoms are controlled with medications. Certain foods like tomato based foods and donuts can cause heartburn and regurgitation. No dysphagia EGD with dilation 2016. His last EGD in 2016 was normal.      Previous Endoscopic Evaluations / Pertinent Studies:  Endoscopic history: EGD 05/26/15 - normal exam EGD 02/06/12 - ? GEJ mild ring, dilated to 13mm [place], he reported benefit Colonoscopy 03/18/06 - mild rectal erythema with nonspecific inflammation, hemorrhoids Colonoscopy 03/27/16 - sigmoid diverticulosis, internal hemorrhoids, 18mm cecal polyp - TA - recall 03/2021   Past Medical History:  Diagnosis Date  . Esophageal stricture   . GERD (gastroesophageal reflux disease)   . History of kidney stones   . Hypertension     Current Medications, Allergies, Past Surgical History, Family History and Social History were reviewed in [employer] record.   Current Outpatient Medications  Medication Sig Dispense Refill  . amLODipine (NORVASC) 10 MG tablet TAKE 1 TABLET BY MOUTH EVERY DAY 90 tablet 1  . baclofen (LIORESAL) 10 MG tablet Take 1 tablet (10 mg total) by mouth at  bedtime. 90 each 3  . buPROPion (WELLBUTRIN XL) 150 MG 24 hr tablet TAKE 1 TABLET BY MOUTH DAILY 30 tablet 5  . cloNIDine (CATAPRES) 0.1 MG tablet Take 1 tablet (0.1 mg total) by mouth 2 (two) times daily. 180 tablet 1  . Dexlansoprazole (DEXILANT) 30 MG capsule Take 1 capsule (30 mg total) by mouth 2 (two) times daily. Please schedule a yearly office visit for further refills.  Thank you 60 capsule 1  . hydrALAZINE (APRESOLINE) 50 MG tablet TAKE 1 TABLET(50 MG) BY MOUTH THREE TIMES DAILY 90 tablet 11  . LORazepam (ATIVAN) 0.5 MG tablet Take 1 tablet (0.5 mg total) by mouth every 8 (eight) hours as needed. for anxiety 90 tablet 0  . losartan (COZAAR) 100 MG tablet TAKE 1 TABLET(100 MG) BY MOUTH DAILY 90 tablet 0  . meloxicam (MOBIC) 15 MG tablet Take 1 tablet (15 mg total) by mouth daily. 30 tablet 0  . methocarbamol (ROBAXIN) 750 MG tablet TAKE 1 TABLET(750 MG) BY MOUTH EVERY 6 HOURS AS NEEDED FOR MUSCLE SPASMS 60 tablet 5  . metoprolol succinate (TOPROL-XL) 100 MG 24 hr tablet TAKE 1 TABLET BY MOUTH EVERY DAY WITH OR IMMEDIATELY FOLLOWING A MEAL 90 tablet 3  . nystatin (MYCOSTATIN) 100000 UNIT/ML suspension Swish and swallow 5 cc in the mouth ever 4 hours. 300 mL 1  . ondansetron (ZOFRAN) 8 MG tablet Take 1 tablet (8 mg total) by mouth every 6 (six) hours as needed for nausea or vomiting. 60 tablet 0  . potassium chloride SA (KLOR-CON)  20 MEQ tablet TAKE 1 TABLET(20 MEQ) BY MOUTH THREE TIMES DAILY 270 tablet 3   No current facility-administered medications for this visit.    Review of Systems: No chest pain. No shortness of breath. No urinary complaints.   PHYSICAL EXAM :    Wt Readings from Last 3 Encounters:  09/14/20 257 lb (116.6 kg)  01/15/20 238 lb (108 kg)  10/27/19 238 lb 6.4 oz (108.1 kg)    BP (!) 168/98 (BP Location: Left Arm, Patient Position: Sitting, Cuff Size: Normal)   Pulse 78   Ht 5\' 9"  (1.753 m)   Wt 257 lb (116.6 kg)   SpO2 98%   BMI 37.95 kg/m    Constitutional:  Pleasant male in no acute distress. Psychiatric: Normal mood and affect. Behavior is normal. EENT: Pupils normal.  Conjunctivae are normal. No scleral icterus. Neck supple.  Cardiovascular: Normal rate, regular rhythm. No edema Pulmonary/chest: Effort normal and breath sounds normal. No wheezing, rales or rhonchi. Abdominal: Soft, nondistended, nontender. Bowel sounds active throughout. There are no masses palpable. No hepatomegaly. Neurological: Alert and oriented to person place and time. Skin: Skin is warm and dry. No rashes noted.  Tye Savoy, NP  09/14/2020, 11:48 AM

## 2020-10-06 ENCOUNTER — Ambulatory Visit: Payer: 59 | Admitting: Podiatry

## 2020-10-06 ENCOUNTER — Other Ambulatory Visit: Payer: Self-pay

## 2020-10-06 ENCOUNTER — Ambulatory Visit (INDEPENDENT_AMBULATORY_CARE_PROVIDER_SITE_OTHER): Payer: 59

## 2020-10-06 ENCOUNTER — Encounter: Payer: Self-pay | Admitting: Podiatry

## 2020-10-06 DIAGNOSIS — M722 Plantar fascial fibromatosis: Secondary | ICD-10-CM | POA: Diagnosis not present

## 2020-10-06 DIAGNOSIS — M7732 Calcaneal spur, left foot: Secondary | ICD-10-CM | POA: Diagnosis not present

## 2020-10-06 MED ORDER — TRIAMCINOLONE ACETONIDE 10 MG/ML IJ SUSP
10.0000 mg | Freq: Once | INTRAMUSCULAR | Status: AC
Start: 2020-10-06 — End: 2020-10-06
  Administered 2020-10-06: 10 mg via INTRA_ARTICULAR

## 2020-10-06 NOTE — Progress Notes (Signed)
Subjective:  Patient ID: Andrew Sharp, male    DOB: May 26, 1971,  MRN: [id number]  Chief Complaint  Patient presents with  . Foot Pain    Left heel pain. PT stated its been there for about a month and he has a burning sensation that is worse in the morning.     49 y.o. male presents with the above complaint.  Patient presents with recurrence of left plantar fasciitis.  Patient has been going on for about a month as burning sensation to right is worse in the morning when taking the first step.  Patient states that he plays a lot of basketball which may have aggravated it.  He was last seen by Dr. Cannon Kettle who had treated her own.  He also states that he has been wearing his orthotics at all time.  His orthotics are in good condition.  He denies any other acute complaints.   Review of Systems: Negative except as noted in the HPI. Denies N/V/F/Ch.  Past Medical History:  Diagnosis Date  . Esophageal stricture   . GERD (gastroesophageal reflux disease)   . History of kidney stones   . Hypertension     Current Outpatient Medications:  .  amLODipine (NORVASC) 10 MG tablet, TAKE 1 TABLET BY MOUTH EVERY DAY, Disp: 90 tablet, Rfl: 1 .  baclofen (LIORESAL) 10 MG tablet, Take 1 tablet (10 mg total) by mouth at bedtime., Disp: 90 each, Rfl: 3 .  buPROPion (WELLBUTRIN XL) 150 MG 24 hr tablet, TAKE 1 TABLET BY MOUTH DAILY, Disp: 30 tablet, Rfl: 5 .  cloNIDine (CATAPRES) 0.1 MG tablet, Take 1 tablet (0.1 mg total) by mouth 2 (two) times daily., Disp: 180 tablet, Rfl: 1 .  Dexlansoprazole (DEXILANT) 30 MG capsule, Take 1 capsule (30 mg total) by mouth 2 (two) times daily., Disp: 60 capsule, Rfl: 11 .  hydrALAZINE (APRESOLINE) 50 MG tablet, TAKE 1 TABLET(50 MG) BY MOUTH THREE TIMES DAILY, Disp: 90 tablet, Rfl: 11 .  LORazepam (ATIVAN) 0.5 MG tablet, Take 1 tablet (0.5 mg total) by mouth every 8 (eight) hours as needed. for anxiety, Disp: 90 tablet, Rfl: 0 .  losartan (COZAAR) 100 MG tablet, TAKE 1  TABLET(100 MG) BY MOUTH DAILY, Disp: 90 tablet, Rfl: 0 .  meloxicam (MOBIC) 15 MG tablet, Take 1 tablet (15 mg total) by mouth daily., Disp: 30 tablet, Rfl: 0 .  methocarbamol (ROBAXIN) 750 MG tablet, TAKE 1 TABLET(750 MG) BY MOUTH EVERY 6 HOURS AS NEEDED FOR MUSCLE SPASMS, Disp: 60 tablet, Rfl: 5 .  metoprolol succinate (TOPROL-XL) 100 MG 24 hr tablet, TAKE 1 TABLET BY MOUTH EVERY DAY WITH OR IMMEDIATELY FOLLOWING A MEAL, Disp: 90 tablet, Rfl: 3 .  nystatin (MYCOSTATIN) 100000 UNIT/ML suspension, Swish and swallow 5 cc in the mouth ever 4 hours., Disp: 300 mL, Rfl: 1 .  ondansetron (ZOFRAN) 8 MG tablet, Take 1 tablet (8 mg total) by mouth every 6 (six) hours as needed for nausea or vomiting., Disp: 60 tablet, Rfl: 0 .  potassium chloride SA (KLOR-CON) 20 MEQ tablet, TAKE 1 TABLET(20 MEQ) BY MOUTH THREE TIMES DAILY, Disp: 270 tablet, Rfl: 3  Social History   Tobacco Use  Smoking Status Never Smoker  Smokeless Tobacco Never Used    No Known Allergies Objective:  There were no vitals filed for this visit. There is no height or weight on file to calculate BMI. Constitutional Well developed. Well nourished.  Vascular Dorsalis pedis pulses palpable bilaterally. Posterior tibial pulses palpable bilaterally. Capillary refill  normal to all digits.  No cyanosis or clubbing noted. Pedal hair growth normal.  Neurologic Normal speech. Oriented to person, place, and time. Epicritic sensation to light touch grossly present bilaterally.  Dermatologic Nails well groomed and normal in appearance. No open wounds. No skin lesions.  Orthopedic: Normal joint ROM without pain or crepitus bilaterally. No visible deformities. Tender to palpation at the calcaneal tuber left. No pain with calcaneal squeeze left. Ankle ROM diminished range of motion left. Silfverskiold Test: positive left.   Radiographs: Taken and reviewed. No acute fractures or dislocations. No evidence of stress fracture.  Plantar heel  spur present. Posterior heel spur present.   Assessment:   1. Plantar fasciitis of left foot   2. Heel spur, left    Plan:  Patient was evaluated and treated and all questions answered.  Plantar Fasciitis, left~recurrence - XR reviewed as above.  - Educated on icing and stretching. Instructions given.  - Injection delivered to the plantar fascia as below. - DME: Plantar Fascial Brace - Pharmacologic management: None -Orthotics are functioning well without any acute problems.  Procedure: Injection Tendon/Ligament Location: Left plantar fascia at the glabrous junction; medial approach. Skin Prep: alcohol Injectate: 0.5 cc 0.5% marcaine plain, 0.5 cc of 1% Lidocaine, 0.5 cc kenalog 10. Disposition: Patient tolerated procedure well. Injection site dressed with a band-aid.  No follow-ups on file.

## 2020-11-03 ENCOUNTER — Encounter: Payer: Self-pay | Admitting: Podiatry

## 2020-11-03 ENCOUNTER — Other Ambulatory Visit: Payer: Self-pay

## 2020-11-03 ENCOUNTER — Ambulatory Visit: Payer: 59 | Admitting: Podiatry

## 2020-11-03 DIAGNOSIS — M722 Plantar fascial fibromatosis: Secondary | ICD-10-CM | POA: Diagnosis not present

## 2020-11-03 NOTE — Progress Notes (Signed)
Subjective:  Patient ID: Andrew Sharp, male    DOB: 1970/11/09,  MRN: [id number]  Chief Complaint  Patient presents with  . Foot Pain    Left heel pain. PT stated that the injection did not help and the brace does help some.    50 y.o. male presents with the above complaint.  Patient presents with a follow-up of recurrence of plantar fasciitis to the left side.  Patient states that is hurting about the same.  The brace does help the injection did not help.  He would like to know what the next step will be.  He states that this happened because of basketball playing without orthotics.   Review of Systems: Negative except as noted in the HPI. Denies N/V/F/Ch.  Past Medical History:  Diagnosis Date  . Esophageal stricture   . GERD (gastroesophageal reflux disease)   . History of kidney stones   . Hypertension     Current Outpatient Medications:  .  amLODipine (NORVASC) 10 MG tablet, TAKE 1 TABLET BY MOUTH EVERY DAY, Disp: 90 tablet, Rfl: 1 .  baclofen (LIORESAL) 10 MG tablet, Take 1 tablet (10 mg total) by mouth at bedtime., Disp: 90 each, Rfl: 3 .  buPROPion (WELLBUTRIN XL) 150 MG 24 hr tablet, TAKE 1 TABLET BY MOUTH DAILY, Disp: 30 tablet, Rfl: 5 .  cloNIDine (CATAPRES) 0.1 MG tablet, Take 1 tablet (0.1 mg total) by mouth 2 (two) times daily., Disp: 180 tablet, Rfl: 1 .  Dexlansoprazole (DEXILANT) 30 MG capsule, Take 1 capsule (30 mg total) by mouth 2 (two) times daily., Disp: 60 capsule, Rfl: 11 .  hydrALAZINE (APRESOLINE) 50 MG tablet, TAKE 1 TABLET(50 MG) BY MOUTH THREE TIMES DAILY, Disp: 90 tablet, Rfl: 11 .  LORazepam (ATIVAN) 0.5 MG tablet, Take 1 tablet (0.5 mg total) by mouth every 8 (eight) hours as needed. for anxiety, Disp: 90 tablet, Rfl: 0 .  losartan (COZAAR) 100 MG tablet, TAKE 1 TABLET(100 MG) BY MOUTH DAILY, Disp: 90 tablet, Rfl: 0 .  meloxicam (MOBIC) 15 MG tablet, Take 1 tablet (15 mg total) by mouth daily., Disp: 30 tablet, Rfl: 0 .  methocarbamol (ROBAXIN)  750 MG tablet, TAKE 1 TABLET(750 MG) BY MOUTH EVERY 6 HOURS AS NEEDED FOR MUSCLE SPASMS, Disp: 60 tablet, Rfl: 5 .  metoprolol succinate (TOPROL-XL) 100 MG 24 hr tablet, TAKE 1 TABLET BY MOUTH EVERY DAY WITH OR IMMEDIATELY FOLLOWING A MEAL, Disp: 90 tablet, Rfl: 3 .  nystatin (MYCOSTATIN) 100000 UNIT/ML suspension, Swish and swallow 5 cc in the mouth ever 4 hours., Disp: 300 mL, Rfl: 1 .  ondansetron (ZOFRAN) 8 MG tablet, Take 1 tablet (8 mg total) by mouth every 6 (six) hours as needed for nausea or vomiting., Disp: 60 tablet, Rfl: 0 .  potassium chloride SA (KLOR-CON) 20 MEQ tablet, TAKE 1 TABLET(20 MEQ) BY MOUTH THREE TIMES DAILY, Disp: 270 tablet, Rfl: 3  Social History   Tobacco Use  Smoking Status Never Smoker  Smokeless Tobacco Never Used    No Known Allergies Objective:  There were no vitals filed for this visit. There is no height or weight on file to calculate BMI. Constitutional Well developed. Well nourished.  Vascular Dorsalis pedis pulses palpable bilaterally. Posterior tibial pulses palpable bilaterally. Capillary refill normal to all digits.  No cyanosis or clubbing noted. Pedal hair growth normal.  Neurologic Normal speech. Oriented to person, place, and time. Epicritic sensation to light touch grossly present bilaterally.  Dermatologic Nails well groomed and normal in  appearance. No open wounds. No skin lesions.  Orthopedic: Normal joint ROM without pain or crepitus bilaterally. No visible deformities. Tender to palpation at the calcaneal tuber left. No pain with calcaneal squeeze left. Ankle ROM diminished range of motion left. Silfverskiold Test: positive left.   Radiographs: Taken and reviewed. No acute fractures or dislocations. No evidence of stress fracture.  Plantar heel spur present. Posterior heel spur present.   Assessment:   1. Plantar fasciitis of left foot    Plan:  Patient was evaluated and treated and all questions answered.  Plantar  Fasciitis, left~recurrence -I will hold off on any further injections have not helped him.  Patient states that he continues to get worse.  Given that the brace is helping acute instructed him that he can stop using the brace and place himself in the cam boot to allow for complete immobilization. -Cam boot was dispensed for complete immobilization. -If there is no improvement we will discuss endoscopic plantar fasciotomy during next clinical visit   Disposition: Patient tolerated procedure well. Injection site dressed with a band-aid.  No follow-ups on file.

## 2020-11-13 ENCOUNTER — Other Ambulatory Visit: Payer: Self-pay | Admitting: Family Medicine

## 2020-11-29 ENCOUNTER — Other Ambulatory Visit: Payer: Self-pay | Admitting: Orthopedic Surgery

## 2020-11-29 DIAGNOSIS — M25521 Pain in right elbow: Secondary | ICD-10-CM

## 2020-12-13 ENCOUNTER — Other Ambulatory Visit: Payer: Self-pay | Admitting: Family Medicine

## 2020-12-15 ENCOUNTER — Telehealth: Payer: Self-pay

## 2020-12-15 NOTE — Telephone Encounter (Signed)
Prior Authorization has been started for Dexlansoprazole.

## 2020-12-28 ENCOUNTER — Ambulatory Visit: Payer: 59 | Admitting: Family Medicine

## 2020-12-28 DIAGNOSIS — Z0289 Encounter for other administrative examinations: Secondary | ICD-10-CM

## 2020-12-29 NOTE — Telephone Encounter (Signed)
The patient's insurance denied Dexilant. It states that the medication and or diagnosis are not a covered benefit and are excluded from coverage.

## 2020-12-29 NOTE — Telephone Encounter (Signed)
I didn't prescribe Dexilant at last visit. Dr. Havery Moros has had him on Dexilant for a long time. Not sure why insurance no longer covering it. If he is out of medication while awaiting Dexilant auth then can take Prilosec 20 mg BID. Thanks

## 2021-01-14 ENCOUNTER — Other Ambulatory Visit: Payer: Self-pay

## 2021-01-17 ENCOUNTER — Encounter: Payer: Self-pay | Admitting: Family Medicine

## 2021-01-17 ENCOUNTER — Other Ambulatory Visit: Payer: Self-pay

## 2021-01-17 ENCOUNTER — Ambulatory Visit (INDEPENDENT_AMBULATORY_CARE_PROVIDER_SITE_OTHER): Payer: 59 | Admitting: Family Medicine

## 2021-01-17 VITALS — BP 148/92 | HR 99 | Temp 98.5°F | Ht 69.5 in | Wt 254.0 lb

## 2021-01-17 DIAGNOSIS — Z0000 Encounter for general adult medical examination without abnormal findings: Secondary | ICD-10-CM | POA: Diagnosis not present

## 2021-01-17 DIAGNOSIS — Z23 Encounter for immunization: Secondary | ICD-10-CM | POA: Diagnosis not present

## 2021-01-17 LAB — CBC WITH DIFFERENTIAL/PLATELET
Basophils Absolute: 0.1 10*3/uL (ref 0.0–0.1)
Basophils Relative: 0.6 % (ref 0.0–3.0)
Eosinophils Absolute: 0.3 10*3/uL (ref 0.0–0.7)
Eosinophils Relative: 3.7 % (ref 0.0–5.0)
HCT: 40.8 % (ref 39.0–52.0)
Hemoglobin: 14.4 g/dL (ref 13.0–17.0)
Lymphocytes Relative: 36.9 % (ref 12.0–46.0)
Lymphs Abs: 3.2 10*3/uL (ref 0.7–4.0)
MCHC: 35.3 g/dL (ref 30.0–36.0)
MCV: 89.3 fl (ref 78.0–100.0)
Monocytes Absolute: 0.9 10*3/uL (ref 0.1–1.0)
Monocytes Relative: 9.7 % (ref 3.0–12.0)
Neutro Abs: 4.3 10*3/uL (ref 1.4–7.7)
Neutrophils Relative %: 49.1 % (ref 43.0–77.0)
Platelets: 215 10*3/uL (ref 150.0–400.0)
RBC: 4.57 Mil/uL (ref 4.22–5.81)
RDW: 13.6 % (ref 11.5–15.5)
WBC: 8.8 10*3/uL (ref 4.0–10.5)

## 2021-01-17 LAB — BASIC METABOLIC PANEL
BUN: 11 mg/dL (ref 6–23)
CO2: 27 mEq/L (ref 19–32)
Calcium: 9.1 mg/dL (ref 8.4–10.5)
Chloride: 104 mEq/L (ref 96–112)
Creatinine, Ser: 1.16 mg/dL (ref 0.40–1.50)
GFR: 73.69 mL/min (ref >60.00)
Glucose, Bld: 103 mg/dL — ABNORMAL HIGH (ref 70–99)
Potassium: 3.2 mEq/L — ABNORMAL LOW (ref 3.5–5.1)
Sodium: 141 mEq/L (ref 135–145)

## 2021-01-17 LAB — LIPID PANEL
Cholesterol: 160 mg/dL (ref 0–200)
HDL: 26.4 mg/dL — ABNORMAL LOW (ref >39.00)
NonHDL: 133.79
Total CHOL/HDL Ratio: 6
Triglycerides: 275 mg/dL — ABNORMAL HIGH (ref 0.0–149.0)
VLDL: 55 mg/dL — ABNORMAL HIGH (ref 0.0–40.0)

## 2021-01-17 LAB — HEPATIC FUNCTION PANEL
ALT: 31 U/L (ref 0–53)
AST: 25 U/L (ref 0–37)
Albumin: 4.4 g/dL (ref 3.5–5.2)
Alkaline Phosphatase: 71 U/L (ref 39–117)
Bilirubin, Direct: 0.1 mg/dL (ref 0.0–0.3)
Total Bilirubin: 0.5 mg/dL (ref 0.2–1.2)
Total Protein: 7.2 g/dL (ref 6.0–8.3)

## 2021-01-17 LAB — LDL CHOLESTEROL, DIRECT: Direct LDL: 70 mg/dL

## 2021-01-17 LAB — HEMOGLOBIN A1C: Hgb A1c MFr Bld: 5.6 % (ref 4.6–6.5)

## 2021-01-17 LAB — PSA: PSA: 0.67 ng/mL (ref 0.10–4.00)

## 2021-01-17 LAB — TSH: TSH: 1.7 u[IU]/mL (ref 0.35–4.50)

## 2021-01-17 MED ORDER — METHYLPREDNISOLONE 4 MG PO TBPK: ORAL_TABLET | ORAL | 0 refills | Status: DC

## 2021-01-17 MED ORDER — HYDROCHLOROTHIAZIDE 25 MG PO TABS: 25.0000 mg | ORAL_TABLET | Freq: Every day | ORAL | 3 refills | Status: DC

## 2021-01-17 MED ORDER — HYDRALAZINE HCL 100 MG PO TABS
100.0000 mg | ORAL_TABLET | Freq: Three times a day (TID) | ORAL | 3 refills | Status: DC
Start: 2021-01-17 — End: 2022-01-30

## 2021-01-17 NOTE — Progress Notes (Signed)
Subjective:    Patient ID: Andrew Sharp, male    DOB: 1971-09-30, 50 y.o.   MRN: [id number]  HPI Here for a well exam. He feels fine in general but he does note that 3 weeks ago he suddenly noticed some hearing loss and some ringing in the left ear. No headache or dizziness. No sinus congestion. His BP remains elevated.    Review of Systems  Constitutional: Negative.   HENT: Positive for hearing loss and tinnitus.   Eyes: Negative.   Respiratory: Negative.   Cardiovascular: Negative.   Gastrointestinal: Negative.   Genitourinary: Negative.   Musculoskeletal: Negative.   Skin: Negative.   Neurological: Negative.   Psychiatric/Behavioral: Negative.        Objective:   Physical Exam Constitutional:      General: He is not in acute distress.    Appearance: He is well-developed. He is not diaphoretic.  HENT:     Head: Normocephalic and atraumatic.     Right Ear: External ear normal.     Left Ear: External ear normal.     Nose: Nose normal.     Mouth/Throat:     Pharynx: No oropharyngeal exudate.  Eyes:     General: No scleral icterus.       Right eye: No discharge.        Left eye: No discharge.     Conjunctiva/sclera: Conjunctivae normal.     Pupils: Pupils are equal, round, and reactive to light.  Neck:     Thyroid: No thyromegaly.     Vascular: No JVD.     Trachea: No tracheal deviation.  Cardiovascular:     Rate and Rhythm: Normal rate and regular rhythm.     Heart sounds: Normal heart sounds. No murmur heard. No friction rub. No gallop.   Pulmonary:     Effort: Pulmonary effort is normal. No respiratory distress.     Breath sounds: Normal breath sounds. No wheezing or rales.  Chest:     Chest wall: No tenderness.  Abdominal:     General: Bowel sounds are normal. There is no distension.     Palpations: Abdomen is soft. There is no mass.     Tenderness: There is no abdominal tenderness. There is no guarding or rebound.  Genitourinary:    Penis: Normal.  No tenderness.      Testes: Normal.     Prostate: Normal.     Rectum: Normal. Guaiac result negative.  Musculoskeletal:        General: No tenderness. Normal range of motion.     Cervical back: Neck supple.  Lymphadenopathy:     Cervical: No cervical adenopathy.  Skin:    General: Skin is warm and dry.     Coloration: Skin is not pale.     Findings: No erythema or rash.  Neurological:     Mental Status: He is alert and oriented to person, place, and time.     Cranial Nerves: No cranial nerve deficit.     Motor: No abnormal muscle tone.     Coordination: Coordination normal.     Deep Tendon Reflexes: Reflexes are normal and symmetric. Reflexes normal.  Psychiatric:        Behavior: Behavior normal.        Thought Content: Thought content normal.        Judgment: Judgment normal.           Assessment & Plan:  Well exam. We discussed diet and exercise. Get  fasting labs. For the HTN, we will increase the Hydralazine to 100 mg TID. For the hearing loss and tinnitus, we will treat this with a Medrol dose pack. I advised him to follow up with [place] if these issues have not resolved in the next 2 weeks.  Alysia Penna, MD

## 2021-01-21 NOTE — Telephone Encounter (Signed)
Patient informed. 

## 2021-01-25 ENCOUNTER — Emergency Department (HOSPITAL_COMMUNITY)
Admission: EM | Admit: 2021-01-25 | Discharge: 2021-01-25 | Disposition: A | Discharge status: Home / Self Care | Payer: 59 | Attending: Emergency Medicine | Admitting: Emergency Medicine

## 2021-01-25 ENCOUNTER — Encounter (HOSPITAL_COMMUNITY): Payer: Self-pay | Admitting: Pharmacy Technician

## 2021-01-25 ENCOUNTER — Encounter: Payer: Self-pay | Admitting: Family Medicine

## 2021-01-25 ENCOUNTER — Emergency Department (HOSPITAL_COMMUNITY): Payer: 59

## 2021-01-25 ENCOUNTER — Other Ambulatory Visit: Payer: Self-pay

## 2021-01-25 DIAGNOSIS — Z20822 Contact with and (suspected) exposure to covid-19: Secondary | ICD-10-CM | POA: Diagnosis not present

## 2021-01-25 DIAGNOSIS — R112 Nausea with vomiting, unspecified: Secondary | ICD-10-CM

## 2021-01-25 DIAGNOSIS — Z8616 Personal history of COVID-19: Secondary | ICD-10-CM | POA: Insufficient documentation

## 2021-01-25 DIAGNOSIS — M5481 Occipital neuralgia: Secondary | ICD-10-CM | POA: Diagnosis not present

## 2021-01-25 DIAGNOSIS — Z96652 Presence of left artificial knee joint: Secondary | ICD-10-CM | POA: Insufficient documentation

## 2021-01-25 DIAGNOSIS — R519 Headache, unspecified: Secondary | ICD-10-CM

## 2021-01-25 DIAGNOSIS — Z79899 Other long term (current) drug therapy: Secondary | ICD-10-CM | POA: Diagnosis not present

## 2021-01-25 DIAGNOSIS — I10 Essential (primary) hypertension: Secondary | ICD-10-CM

## 2021-01-25 DIAGNOSIS — R0789 Other chest pain: Secondary | ICD-10-CM

## 2021-01-25 LAB — COMPREHENSIVE METABOLIC PANEL
ALT: 31 U/L (ref 0–44)
AST: 23 U/L (ref 15–41)
Albumin: 4.1 g/dL (ref 3.5–5.0)
Alkaline Phosphatase: 70 U/L (ref 38–126)
Anion gap: 6 (ref 5–15)
BUN: 10 mg/dL (ref 6–20)
CO2: 27 mmol/L (ref 22–32)
Calcium: 9.1 mg/dL (ref 8.9–10.3)
Chloride: 104 mmol/L (ref 98–111)
Creatinine, Ser: 1.21 mg/dL (ref 0.61–1.24)
GFR, Estimated: >60 mL/min (ref >60)
Glucose, Bld: 107 mg/dL — ABNORMAL HIGH (ref 70–99)
Potassium: 3.3 mmol/L — ABNORMAL LOW (ref 3.5–5.1)
Sodium: 137 mmol/L (ref 135–145)
Total Bilirubin: 0.6 mg/dL (ref 0.3–1.2)
Total Protein: 7.4 g/dL (ref 6.5–8.1)

## 2021-01-25 LAB — CBC WITH DIFFERENTIAL/PLATELET
Abs Immature Granulocytes: 0.06 10*3/uL (ref 0.00–0.07)
Basophils Absolute: 0.1 10*3/uL (ref 0.0–0.1)
Basophils Relative: 1 %
Eosinophils Absolute: 0.1 10*3/uL (ref 0.0–0.5)
Eosinophils Relative: 1 %
HCT: 42.8 % (ref 39.0–52.0)
Hemoglobin: 15.2 g/dL (ref 13.0–17.0)
Immature Granulocytes: 1 %
Lymphocytes Relative: 21 %
Lymphs Abs: 2.7 10*3/uL (ref 0.7–4.0)
MCH: 31.7 pg (ref 26.0–34.0)
MCHC: 35.5 g/dL (ref 30.0–36.0)
MCV: 89.4 fL (ref 80.0–100.0)
Monocytes Absolute: 1.1 10*3/uL — ABNORMAL HIGH (ref 0.1–1.0)
Monocytes Relative: 9 %
Neutro Abs: 8.9 10*3/uL — ABNORMAL HIGH (ref 1.7–7.7)
Neutrophils Relative %: 67 %
Platelets: 269 10*3/uL (ref 150–400)
RBC: 4.79 MIL/uL (ref 4.22–5.81)
RDW: 13.9 % (ref 11.5–15.5)
WBC: 13 10*3/uL — ABNORMAL HIGH (ref 4.0–10.5)
nRBC: 0 % (ref 0.0–0.2)

## 2021-01-25 LAB — RESP PANEL BY RT-PCR (FLU A&B, COVID) ARPGX2
Influenza A by PCR: NEGATIVE
Influenza B by PCR: NEGATIVE
SARS Coronavirus 2 by RT PCR: NEGATIVE

## 2021-01-25 LAB — TROPONIN I (HIGH SENSITIVITY): Troponin I (High Sensitivity): 6 ng/L (ref <18)

## 2021-01-25 LAB — LIPASE, BLOOD: Lipase: 32 U/L (ref 11–51)

## 2021-01-25 MED ORDER — POTASSIUM CHLORIDE CRYS ER 20 MEQ PO TBCR
40.0000 meq | EXTENDED_RELEASE_TABLET | Freq: Once | ORAL | Status: AC
  Administered 2021-01-25: 40 meq via ORAL
  Filled 2021-01-25: qty 2

## 2021-01-25 MED ORDER — LACTATED RINGERS IV BOLUS
500.0000 mL | Freq: Once | INTRAVENOUS | Status: AC
  Administered 2021-01-25: 500 mL via INTRAVENOUS

## 2021-01-25 MED ORDER — DEXAMETHASONE SODIUM PHOSPHATE 10 MG/ML IJ SOLN
10.0000 mg | Freq: Once | INTRAMUSCULAR | Status: AC
  Administered 2021-01-25: 10 mg via INTRAVENOUS
  Filled 2021-01-25: qty 1

## 2021-01-25 MED ORDER — METOCLOPRAMIDE HCL 5 MG/ML IJ SOLN
10.0000 mg | Freq: Once | INTRAMUSCULAR | Status: AC
  Administered 2021-01-25: 10 mg via INTRAVENOUS
  Filled 2021-01-25: qty 2

## 2021-01-25 MED ORDER — SPIRONOLACTONE 50 MG PO TABS: 50.0000 mg | ORAL_TABLET | Freq: Every day | ORAL | 3 refills | Status: DC

## 2021-01-25 MED ORDER — ACETAMINOPHEN 500 MG PO TABS
1000.0000 mg | ORAL_TABLET | Freq: Once | ORAL | Status: AC
  Administered 2021-01-25: 1000 mg via ORAL
  Filled 2021-01-25: qty 2

## 2021-01-25 MED ORDER — DIPHENHYDRAMINE HCL 25 MG PO CAPS
50.0000 mg | ORAL_CAPSULE | Freq: Once | ORAL | Status: AC
  Administered 2021-01-25: 50 mg via ORAL
  Filled 2021-01-25: qty 2

## 2021-01-25 NOTE — ED Triage Notes (Signed)
Pt here POV with reports of headache, nausea, dizziness for approx 4 days. Pt denies known sick contacts. No neuro deficits noted.

## 2021-01-25 NOTE — Telephone Encounter (Signed)
Stay on his current meds but add Spironolactone 50 mg daily to them. Call in #[phone] with 3 rf

## 2021-01-25 NOTE — ED Provider Notes (Signed)
[facility] EMERGENCY DEPARTMENT Provider Note   CSN: [id number] Arrival date & time: 01/25/21  1424     History Chief Complaint  Patient presents with  . Nausea  . Dizziness  . Headache    MONTGOMERY FAVOR is a 50 y.o. male with past medical history of hypertension, GERD presents to the ED for several concerns.  He reports for the last 5 days he has had posterior headache described as throbbing with neck "tension", nausea, vomiting x2 earlier today, generalized fatigue, achy all over.  Reports 1 year ago having Covid and symptoms specifically his headache felt similar to when he had Covid so he did a rapid antigen test which was negative.  No rhinorrhea, sore throat, cough, abdominal pain, diarrhea.  No sick contacts. Vaccinated twice for COVID with a booster. His vision has been intermittently blurry from both eyes as well.  No double vision or loss of vision.  No eye pain.  He does not wear corrective glasses or contacts.  Also reports left-sided chest pain that he describes as a "pulled muscle" this has been constant, for the last 5 days.  Nothing seems to make it better or worse.  No more pain with exertion, activity, position changes, deep breathing.  No fever, cough. No shortness of breath, hemoptysis, leg swelling or calf pain.  No history of PE/DVT. Has also been urinating more than usual.  No dysuria. Reports last Monday he had a physical exam with his primary care doctor who changed his blood pressure medicines.  Over the last few days that he has been feeling unwell he has not been checking his blood pressure more frequently.  His blood pressures have been in the 160s over 100s.  Reports usually he does not check his blood pressure so he does not know his baseline.  He messaged his PCP who added a new BP medicine today but he has not taken it.   HPI     Past Medical History:  Diagnosis Date  . Esophageal stricture   . GERD (gastroesophageal reflux disease)   .  History of kidney stones   . Hypertension     Patient Active Problem List   Diagnosis Date Noted  . COVID-19 virus infection 01/15/2020  . Hypokalemia 07/15/2018  . Depression with anxiety 05/02/2017  . Gross hematuria 01/27/2016  . History of esophageal stricture 03/28/2015  . Regurgitation 03/28/2015  . Stricture and stenosis of esophagus 04/29/2012  . Essential hypertension 09/21/2009  . RECTAL BLEEDING 04/15/2008  . HEMORRHOIDS 04/14/2008  . GERD 03/10/2008  . HEART MURMUR, HX OF 03/10/2008  . NEPHROLITHIASIS, HX OF 03/10/2008    Past Surgical History:  Procedure Laterality Date  . COLONOSCOPY  03-16-06   per Dr. Deatra Ina, nonspecific colitis and int. hemorrhoids  . CT CORONARY CA SCORING  09-19-11   for chest pains, negative   . KNEE ARTHROPLASTY Left        Family History  Problem Relation Age of Onset  . Colon cancer Maternal Aunt   . Pancreatic cancer Maternal Grandmother   . Crohn's disease Other        niece  . Diabetes Brother        MOTHER,AUNT  . Heart disease Mother   . Kidney disease Mother   . Prostate cancer Maternal Grandfather   . Esophageal cancer Neg Hx   . Rectal cancer Neg Hx   . Stomach cancer Neg Hx     Social History   Tobacco Use  .  Smoking status: Never Smoker  . Smokeless tobacco: Never Used  Vaping Use  . Vaping Use: Never used  Substance Use Topics  . Alcohol use: No    Alcohol/week: 0.0 standard drinks  . Drug use: No    Home Medications Prior to Admission medications   Medication Sig Start Date End Date Taking? Authorizing Provider  spironolactone (ALDACTONE) 50 MG tablet Take 1 tablet (50 mg total) by mouth daily. 01/25/21   Laurey Morale, MD  amLODipine (NORVASC) 10 MG tablet TAKE 1 TABLET BY MOUTH EVERY DAY 12/13/20   Laurey Morale, MD  baclofen (LIORESAL) 10 MG tablet Take 1 tablet (10 mg total) by mouth at bedtime. 09/14/20   Willia Craze, NP  buPROPion (WELLBUTRIN XL) 150 MG 24 hr tablet TAKE 1 TABLET BY MOUTH  DAILY 09/13/20   Laurey Morale, MD  cloNIDine (CATAPRES) 0.1 MG tablet Take 1 tablet (0.1 mg total) by mouth 2 (two) times daily. 08/17/20   Laurey Morale, MD  Dexlansoprazole (DEXILANT) 30 MG capsule Take 1 capsule (30 mg total) by mouth 2 (two) times daily. 09/14/20   Willia Craze, NP  hydrALAZINE (APRESOLINE) 100 MG tablet Take 1 tablet (100 mg total) by mouth 3 (three) times daily. 01/17/21   Laurey Morale, MD  LORazepam (ATIVAN) 0.5 MG tablet Take 1 tablet (0.5 mg total) by mouth every 8 (eight) hours as needed. for anxiety 12/17/19   Laurey Morale, MD  losartan (COZAAR) 100 MG tablet TAKE 1 TABLET(100 MG) BY MOUTH DAILY 11/15/20   Laurey Morale, MD  methocarbamol (ROBAXIN) 750 MG tablet TAKE 1 TABLET(750 MG) BY MOUTH EVERY 6 HOURS AS NEEDED FOR MUSCLE SPASMS 03/11/20   Laurey Morale, MD  methylPREDNISolone (MEDROL DOSEPAK) 4 MG TBPK tablet As directed 01/17/21   Laurey Morale, MD  metoprolol succinate (TOPROL-XL) 100 MG 24 hr tablet TAKE 1 TABLET BY MOUTH EVERY DAY WITH OR IMMEDIATELY FOLLOWING A MEAL 05/18/20   Laurey Morale, MD  potassium chloride SA (KLOR-CON) 20 MEQ tablet TAKE 1 TABLET(20 MEQ) BY MOUTH THREE TIMES DAILY 08/13/20   Laurey Morale, MD    Allergies    Patient has no known allergies.  Review of Systems   Review of Systems  Constitutional: Positive for fatigue.  Eyes: Positive for visual disturbance.  Cardiovascular: Positive for chest pain.  Gastrointestinal: Positive for nausea and vomiting.  Musculoskeletal: Positive for myalgias.  Neurological: Positive for headaches.  All other systems reviewed and are negative.   Physical Exam Updated Vital Signs BP (!) 144/89 (BP Location: Left Arm)   Pulse 85   Temp (!) 97.2 F (36.2 C) (Oral)   Resp 16   SpO2 100%   Physical Exam Constitutional:      Appearance: He is well-developed.     Comments: NAD. Non toxic.   HENT:     Head: Normocephalic and atraumatic.     Nose: Nose normal.  Eyes:     General:  Lids are normal.     Conjunctiva/sclera: Conjunctivae normal.  Neck:     Trachea: Trachea normal.     Comments: Trachea midline. No tenderness midline or paraspinal muscle of cervical spine. Full ROM of neck without rigidity or pain. Cardiovascular:     Rate and Rhythm: Normal rate and regular rhythm.     Pulses:          Radial pulses are 1+ on the right side and 1+ on the left side.  Dorsalis pedis pulses are 1+ on the right side and 1+ on the left side.     Heart sounds: Normal heart sounds, S1 normal and S2 normal.     Comments: No murmurs. No LE edema or calf tenderness.  Pulmonary:     Effort: Pulmonary effort is normal.     Breath sounds: Normal breath sounds.  Abdominal:     General: Bowel sounds are normal.     Palpations: Abdomen is soft.     Tenderness: There is no abdominal tenderness.     Comments: No epigastric or upper abdominal tenderness.  Musculoskeletal:     Cervical back: Normal range of motion.  Skin:    General: Skin is warm and dry.     Capillary Refill: Capillary refill takes less than 2 seconds.     Comments: No rash to chest wall  Neurological:     Mental Status: He is alert.     GCS: GCS eye subscore is 4. GCS verbal subscore is 5. GCS motor subscore is 6.     Comments:  Mental Status: Patient is awake, alert, oriented to person, place, year, and situation. Patient is able to give a clear and coherent history.  Speech is fluent and clear without dysarthria or aphasia.  No signs of neglect.  Cranial Nerves: I not tested II visual fields full bilaterally. PERRL.   III, IV, VI EOMs intact without ptosis V sensation to light touch intact in all 3 divisions of trigeminal nerve bilaterally  VII facial movements symmetric bilaterally VIII hearing intact to voice/conversation  IX, X no uvula deviation, symmetric rise of soft palate/uvula XI 5/5 SCM and trapezius strength bilaterally  XII tongue protrusion midline, symmetric L/R  movements  Motor: Strength 5/5 in upper/lower extremities.   Sensation to light touch intact in face, upper/lower extremities. No pronator drift. No leg drop.  Cerebellar: No ataxia with finger to nose.  Steady gait. No truncal sway. Normal Romberg.   Psychiatric:        Speech: Speech normal.        Behavior: Behavior normal.        Thought Content: Thought content normal.     ED Results / Procedures / Treatments   Labs (all labs ordered are listed, but only abnormal results are displayed) Labs Reviewed  CBC WITH DIFFERENTIAL/PLATELET - Abnormal; Notable for the following components:      Result Value   WBC 13.0 (*)    Neutro Abs 8.9 (*)    Monocytes Absolute 1.1 (*)    All other components within normal limits  COMPREHENSIVE METABOLIC PANEL - Abnormal; Notable for the following components:   Potassium 3.3 (*)    Glucose, Bld 107 (*)    All other components within normal limits  RESP PANEL BY RT-PCR (FLU A&B, COVID) ARPGX2  LIPASE, BLOOD  TROPONIN I (HIGH SENSITIVITY)    EKG EKG Interpretation  Date/Time:  Tuesday January 25 2021 14:42:19 EDT Ventricular Rate:  71 PR Interval:  170 QRS Duration: 98 QT Interval:  398 QTC Calculation: 432 R Axis:   66 Text Interpretation: Normal sinus rhythm Abnormal QRS-T angle, consider primary T wave abnormality Abnormal ECG Confirmed by Lacretia Leigh ([id number]) on 01/25/2021 4:31:03 PM   Radiology DG Chest 2 View  Result Date: 01/25/2021 CLINICAL DATA:  Left-sided chest pain. EXAM: CHEST - 2 VIEW COMPARISON:  October 13, 2019 FINDINGS: The heart size and mediastinal contours are within normal limits. Both lungs are clear. The visualized skeletal structures  are unremarkable. IMPRESSION: No active cardiopulmonary disease. Electronically Signed   By: Constance Holster M.D.   On: 01/25/2021 19:24    Procedures Procedures   Medications Ordered in ED Medications  potassium chloride SA (KLOR-CON) CR tablet 40 mEq (40 mEq Oral Given  01/25/21 1800)  lactated ringers bolus 500 mL (0 mLs Intravenous Stopped 01/25/21 1847)  dexamethasone (DECADRON) injection 10 mg (10 mg Intravenous Given 01/25/21 1806)  metoCLOPramide (REGLAN) injection 10 mg (10 mg Intravenous Given 01/25/21 1808)  diphenhydrAMINE (BENADRYL) capsule 50 mg (50 mg Oral Given 01/25/21 1801)  acetaminophen (TYLENOL) tablet 1,000 mg (1,000 mg Oral Given 01/25/21 1800)    ED Course  I have reviewed the triage vital signs and the nursing notes.  Pertinent labs & imaging results that were available during my care of the patient were reviewed by me and considered in my medical decision making (see chart for details).  Clinical Course as of 01/25/21 2124  Tue Jan 25, 2021  1733 WBC(!): 13.0 [CG]  1733 NEUT#(!): 8.9 [CG]  1733 Potassium(!): 3.3 [CG]    Clinical Course User Index [CG] Kinnie Feil, PA-C   MDM Rules/Calculators/A&P                          50 year old male with history of hypertension and GERD presents to the ED with several complaints including occipital headache, neck tension, nausea, vomiting x2, generalized fatigue, myalgias, urinary frequency, left-sided chest pain, blurred vision.  Onset 5 days ago.  Reports he felt similarly a year ago when he had Covid, tested negative at home with a rapid antigen test.  Recently seen for physical exam on Monday and blood pressure medicines were changed.  Has noted in the last few days that he has been feeling poorly that his blood pressure has been elevated.  EMR, triage or nurse notes reviewed.  Seen by PCP 3/28 in the office his BP was 148/92.  His hydralazine was increased.  PCP added spironolactone today.  His blood pressure in the ED has been ranging from 144-163/84-97.    Lab work initiated in triage.  I have added additional lab work, including troponin, EKG, chest x-ray  Labs reveal-WBC 13 with left shift.  K3.3.  Troponin is 6, in setting of constant left-sided chest pain for the last 5 days feel  like a delta troponin is not necessary.  Respiratory panel is negative.  Lipase, LFTs unremarkable.  Imaging reveals-chest x-ray unremarkable.  EKG with nonspecific T wave abnormality/flattening in inferior leads.  When compared to previous is actually improved.  Medicines ordered in the ED-migraine cocktail including LR bolus, Reglan, Decadron, Tylenol and Benadryl.  Oral okay for mild hypokalemia as well.  Patient was reevaluated and reports 50% improvement in headache.  Blood pressure actually improved after improvement in headache 144/89.  Given improvement in symptoms, reassuring work-up.  Feel patient is stable for discharge.  Constant left-sided chest pain with negative chest pain work-up in the ED, doubt ACS.  Doubt PE as well as he has no tachycardia, hypoxia or tachypnea.  Chest pain is not pleuritic.  Overall presentation may be from a viral syndrome given fatigue, aches, nausea, vomiting, headaches.  WBC is slightly elevated.  No abdominal tenderness and will defer abdominal imaging.  In regards to the headache in setting of elevated blood pressure, consider intracranial process like bleed, stroke highly unlikely.  Doubt meningitis he has no meningismus, fevers, confusions.  Since his headache improved after  migraine cocktail, feel it is reasonable to defer neuroimaging.  My suspicion for intracranial process is very low.  It appears patient has had poorly controlled blood pressure for long time per vital sign flow sheet review.  Evidence of hypertensive emergency here.  Will defer any BP medication changes to PCP.  Recommended follow-up with PCP this week for ear follow-up.  Return precautions discussed.  Patient agreeable with this.  Final Clinical Impression(s) / ED Diagnoses Final diagnoses:  Occipital headache  Elevated blood pressure reading with diagnosis of hypertension  Atypical chest pain  Nausea and vomiting in adult patient    Rx / DC Orders ED Discharge Orders    None        Arlean Hopping 01/25/21 2124    Lacretia Leigh, MD 01/27/21 1122

## 2021-01-25 NOTE — Discharge Instructions (Signed)
You were seen in the ER for headache, blurred vision, nausea, vomiting, chest pain, fatigue, body aches  Lab work and chest pain work up was ok .  Your covid and flu tests were negative  Your headache improved after medicines  At this time the cause of your symptoms is unclear - it may be another virus. Your headaches may be from labile/fluctuating blood pressure  Take tylenol (acetaminophen) 500-650 mg every 8 hours for headaches, aches.  Rest.   Call your PCP and schedule a follow up visit  Return for sudden worsening severe headache, stroke symptoms, chest pain or shortness of breath with exertion, persistent vomiting or abdominal pain that does not improve

## 2021-01-25 NOTE — ED Triage Notes (Signed)
Emergency Medicine Provider Triage Evaluation Note  Andrew Sharp , a 50 y.o. male  was evaluated in triage.  Pt complains of headache, nausea, vomiting x4 days. 4 episodes of non-bloody, non-bilious emesis. Patient states his BP has been elevated over the past week 160s/100. BP medications changed 1 week ago. Headache located in posterior aspect and radiates to top of head associated with diplopia. No unilateral weakness or speech changes. No head injury. Patient states he thought his symptoms were related to COVID-19 infection; however, he tested negative with antigen test on Sunday. He also notes his potassium was low last week on routine labs and is on chronic potassium pills which he has been compliant with.  Review of Systems  Positive: Nausea, vomiting, headache, diplopia   Physical Exam  BP (!) 163/96 (BP Location: Right Arm)   Pulse 71   Temp 98.7 F (37.1 C) (Oral)   Resp 15   SpO2 94%  Gen:   Awake, no distress   HEENT:  Atraumatic  Resp:  Normal effort  Cardiac:  Normal rate  Abd:   Nondistended, nontender  MSK:   Moves extremities without difficulty  Neuro:  Speech clear, no facial droop  Medical Decision Making  Medically screening exam initiated at 2:43 PM.  Appropriate orders placed.  Chaya Jan was informed that the remainder of the evaluation will be completed by another provider, this initial triage assessment does not replace that evaluation, and the importance of remaining in the ED until their evaluation is complete.  Clinical Impression  Headache, nausea, vomiting. Routine labs ordered.    Suzy Bouchard, [place] 01/25/21 1457

## 2021-02-20 ENCOUNTER — Other Ambulatory Visit: Payer: Self-pay | Admitting: Family Medicine

## 2021-03-12 ENCOUNTER — Other Ambulatory Visit: Payer: Self-pay | Admitting: Family Medicine

## 2021-03-14 ENCOUNTER — Other Ambulatory Visit: Payer: Self-pay | Admitting: Family Medicine

## 2021-05-02 ENCOUNTER — Telehealth: Payer: Self-pay

## 2021-05-02 MED ORDER — BACLOFEN 10 MG PO TABS: 10.0000 mg | ORAL_TABLET | Freq: Every day | ORAL | 5 refills | Status: DC

## 2021-05-02 NOTE — Telephone Encounter (Signed)
Refill sent.  Recall OV for 08-2021 placed.

## 2021-05-02 NOTE — Telephone Encounter (Signed)
Patient requesting refill of Baclofen 10 mg qhs.  Patient last seen 08-2020. Please advise. Thank you

## 2021-05-18 ENCOUNTER — Ambulatory Visit: Payer: 59 | Admitting: Podiatry

## 2021-05-29 ENCOUNTER — Other Ambulatory Visit: Payer: Self-pay | Admitting: Family Medicine

## 2021-06-04 ENCOUNTER — Other Ambulatory Visit: Payer: Self-pay | Admitting: Family Medicine

## 2021-06-16 ENCOUNTER — Ambulatory Visit: Payer: 59 | Admitting: Nurse Practitioner

## 2021-07-05 ENCOUNTER — Other Ambulatory Visit: Payer: Self-pay

## 2021-07-06 ENCOUNTER — Encounter: Payer: Self-pay | Admitting: Family Medicine

## 2021-07-06 ENCOUNTER — Ambulatory Visit: Payer: 59 | Admitting: Family Medicine

## 2021-07-06 VITALS — BP 130/80 | HR 66 | Temp 98.6°F | Wt 253.0 lb

## 2021-07-06 DIAGNOSIS — G4733 Obstructive sleep apnea (adult) (pediatric): Secondary | ICD-10-CM | POA: Diagnosis not present

## 2021-07-06 NOTE — Progress Notes (Signed)
   Subjective:    Patient ID: Andrew Sharp, male    DOB: 1971-09-19, 50 y.o.   MRN: [id number]  HPI Here to discuss how he feels in the mornings. About 2 months ago he began to feel very tired and somewhat nauseated when he gets up in the mornings. This last about 30 minutes and then it resolves. No recent change in medications. His BP is stable. He notes that his wife tells him he snores and that he often "stops breathing" in his sleep. He describes feeling sleepy throughout the day. He had a complete exam with lab work in March.    Review of Systems  Constitutional:  Positive for fatigue.  Respiratory: Negative.    Cardiovascular: Negative.   Gastrointestinal:  Positive for nausea. Negative for abdominal distention, abdominal pain, blood in stool, constipation, diarrhea and vomiting.  Genitourinary: Negative.       Objective:   Physical Exam Constitutional:      Appearance: Normal appearance.  Cardiovascular:     Rate and Rhythm: Normal rate and regular rhythm.     Pulses: Normal pulses.     Heart sounds: Normal heart sounds.  Pulmonary:     Effort: Pulmonary effort is normal.     Breath sounds: Normal breath sounds.  Neurological:     Mental Status: He is alert.          Assessment & Plan:  He likely has sleep apnea. We will refer him to Pulmonology to evaluate further.  Alysia Penna, MD

## 2021-07-19 ENCOUNTER — Ambulatory Visit: Payer: 59 | Admitting: Nurse Practitioner

## 2021-07-19 ENCOUNTER — Encounter: Payer: Self-pay | Admitting: Nurse Practitioner

## 2021-07-19 ENCOUNTER — Other Ambulatory Visit (INDEPENDENT_AMBULATORY_CARE_PROVIDER_SITE_OTHER): Payer: 59

## 2021-07-19 VITALS — BP 118/78 | HR 68 | Ht 69.5 in | Wt 255.2 lb

## 2021-07-19 DIAGNOSIS — R110 Nausea: Secondary | ICD-10-CM

## 2021-07-19 DIAGNOSIS — K219 Gastro-esophageal reflux disease without esophagitis: Secondary | ICD-10-CM

## 2021-07-19 DIAGNOSIS — Z86010 Personal history of colonic polyps: Secondary | ICD-10-CM

## 2021-07-19 LAB — CBC WITH DIFFERENTIAL/PLATELET
Basophils Absolute: 0 10*3/uL (ref 0.0–0.1)
Basophils Relative: 0.5 % (ref 0.0–3.0)
Eosinophils Absolute: 0.4 10*3/uL (ref 0.0–0.7)
Eosinophils Relative: 3.9 % (ref 0.0–5.0)
HCT: 39.6 % (ref 39.0–52.0)
Hemoglobin: 13.7 g/dL (ref 13.0–17.0)
Lymphocytes Relative: 38.3 % (ref 12.0–46.0)
Lymphs Abs: 3.5 10*3/uL (ref 0.7–4.0)
MCHC: 34.6 g/dL (ref 30.0–36.0)
MCV: 91.9 fl (ref 78.0–100.0)
Monocytes Absolute: 1 10*3/uL (ref 0.1–1.0)
Monocytes Relative: 11 % (ref 3.0–12.0)
Neutro Abs: 4.2 10*3/uL (ref 1.4–7.7)
Neutrophils Relative %: 46.3 % (ref 43.0–77.0)
Platelets: 232 10*3/uL (ref 150.0–400.0)
RBC: 4.31 Mil/uL (ref 4.22–5.81)
RDW: 12.8 % (ref 11.5–15.5)
WBC: 9 10*3/uL (ref 4.0–10.5)

## 2021-07-19 LAB — COMPREHENSIVE METABOLIC PANEL
ALT: 22 U/L (ref 0–53)
AST: 17 U/L (ref 0–37)
Albumin: 4.5 g/dL (ref 3.5–5.2)
Alkaline Phosphatase: 65 U/L (ref 39–117)
BUN: 13 mg/dL (ref 6–23)
CO2: 23 mEq/L (ref 19–32)
Calcium: 9.6 mg/dL (ref 8.4–10.5)
Chloride: 105 mEq/L (ref 96–112)
Creatinine, Ser: 1.4 mg/dL (ref 0.40–1.50)
GFR: 58.59 mL/min — ABNORMAL LOW (ref >60.00)
Glucose, Bld: 90 mg/dL (ref 70–99)
Potassium: 4.1 mEq/L (ref 3.5–5.1)
Sodium: 136 mEq/L (ref 135–145)
Total Bilirubin: 0.4 mg/dL (ref 0.2–1.2)
Total Protein: 7.8 g/dL (ref 6.0–8.3)

## 2021-07-19 MED ORDER — PLENVU 140 G PO SOLR: ORAL | 0 refills | Status: DC

## 2021-07-19 MED ORDER — ONDANSETRON 4 MG PO TBDP: ORAL_TABLET | ORAL | 0 refills | Status: DC

## 2021-07-19 MED ORDER — OMEPRAZOLE 20 MG PO CPDR: 20.0000 mg | DELAYED_RELEASE_CAPSULE | Freq: Two times a day (BID) | ORAL | 1 refills | Status: DC

## 2021-07-19 NOTE — Progress Notes (Signed)
07/19/2021 ARIS EVEN [id number] August 13, 1971   Chief Complaint: Nausea  History of Present Illness: Andrew Sharp. Olenik is a 50 year old male with a past medical history of any stones, hypertension and GERD.  He is followed by Dr. Havery Moros.  He presents to our office today for further evaluation regarding nausea which started approximately 4 months ago after he stopped taking Dexilant as his insurance no longer cover this medication.  His reflux symptoms were fairly well controlled but recurred approximately 7 to 10 days after he stopped taking Dexilant.  He continued to take Baclofen at nighttime and when 1 week later he started taking Omeprazole 20 mg OTC daily.  Since that time, he has less heartburn, now has heartburn mostly if he lays flat at nighttime.  No dysphagia. He has nausea upon awakening every morning, he feels as if he could vomit but does not.  He is able to eat breakfast and his nausea diminishes throughout the rest of the day.  He feels full easily, he reports eating 50% less without appreciating any weight loss.  He drinks three 12 ounce sodas daily and 32 ounces of water daily. His most recent EGD was 05/26/2015 which was normal.  His most recent colonoscopy was 03/27/2016 which identified a 4 mm polyp which was removed from the cecum, sigmoid diverticulosis and internal hemorrhoids.  He was advised to repeat a colonoscopy 03/2021.  Paternal aunt with history of colon cancer.  He was seen by his PCP due to having nausea and fatigue and there was some concern he may have sleep apnea so he is scheduled to see pulmonology on 08/16/2021 for sleep apnea evaluation.  CBC Latest Ref Rng & Units 01/25/2021 01/17/2021 10/13/2019  WBC 4.0 - 10.5 K/uL 13.0(H) 8.8 10.2  Hemoglobin 13.0 - 17.0 g/dL 15.2 14.4 15.0  Hematocrit 39.0 - 52.0 % 42.8 40.8 42.1  Platelets 150 - 400 K/uL 269 215.0 265    CMP Latest Ref Rng & Units 01/25/2021 01/17/2021 10/13/2019  Glucose 70 - 99 mg/dL 107(H)  103(H) 116(H)  BUN 6 - 20 mg/dL 10 11 7   Creatinine 0.61 - 1.24 mg/dL 1.21 1.16 1.25(H)  Sodium 135 - 145 mmol/L 137 141 138  Potassium 3.5 - 5.1 mmol/L 3.3(L) 3.2(L) 3.4(L)  Chloride 98 - 111 mmol/L 104 104 105  CO2 22 - 32 mmol/L 27 27 23   Calcium 8.9 - 10.3 mg/dL 9.1 9.1 9.3  Total Protein 6.5 - 8.1 g/dL 7.4 7.2 -  Total Bilirubin 0.3 - 1.2 mg/dL 0.6 0.5 -  Alkaline Phos 38 - 126 U/L 70 71 -  AST 15 - 41 U/L 23 25 -  ALT 0 - 44 U/L 31 31 -     Endoscopic history: EGD 05/26/15 - normal exam EGD 02/06/12 - ? GEJ mild ring, dilated to 16mm Savory, he reported benefit Colonoscopy 03/18/06 - mild rectal erythema with nonspecific inflammation, hemorrhoids Colonoscopy 03/27/16 - sigmoid diverticulosis, internal hemorrhoids, 48mm cecal polyp - TA - recall 03/2021   Current Outpatient Medications on File Prior to Visit  Medication Sig Dispense Refill   amLODipine (NORVASC) 10 MG tablet TAKE 1 TABLET BY MOUTH EVERY DAY 90 tablet 1   baclofen (LIORESAL) 10 MG tablet Take 1 tablet (10 mg total) by mouth at bedtime. 30 tablet 5   buPROPion (WELLBUTRIN XL) 150 MG 24 hr tablet TAKE 1 TABLET BY MOUTH DAILY 30 tablet 5   cloNIDine (CATAPRES) 0.1 MG tablet TAKE 1 TABLET(0.1 MG) BY  MOUTH TWICE DAILY 180 tablet 1   hydrALAZINE (APRESOLINE) 100 MG tablet Take 1 tablet (100 mg total) by mouth 3 (three) times daily. 270 tablet 3   losartan (COZAAR) 100 MG tablet TAKE 1 TABLET(100 MG) BY MOUTH DAILY 90 tablet 3   metoprolol succinate (TOPROL-XL) 100 MG 24 hr tablet TAKE 1 TABLET BY MOUTH EVERY DAY WITH OR IMMEDIATELY FOLLOWING A MEAL 90 tablet 3   potassium chloride SA (KLOR-CON) 20 MEQ tablet TAKE 1 TABLET(20 MEQ) BY MOUTH THREE TIMES DAILY 270 tablet 3   spironolactone (ALDACTONE) 50 MG tablet TAKE 1 TABLET(50 MG) BY MOUTH DAILY 30 tablet 3   No current facility-administered medications on file prior to visit.   No Known Allergies  Current Medications, Allergies, Past Medical History, Past Surgical  History, Family History and Social History were reviewed in [employer] record.  Review of Systems:   Constitutional: Negative for fever, sweats, chills or weight loss.  Respiratory: Negative for shortness of breath.   Cardiovascular: Negative for chest pain, palpitations and leg swelling.  Gastrointestinal: See HPI.  Musculoskeletal: Negative for back pain or muscle aches.  Neurological: Negative for dizziness, headaches or paresthesias.    Physical Exam: BP 118/78   Pulse 68   Ht 5' 9.5" (1.765 m)   Wt 255 lb 3.2 oz (115.8 kg)   BMI 37.15 kg/m  General: 50 year old male in no acute distress. Head: Normocephalic and atraumatic. Eyes: No scleral icterus. Conjunctiva pink . Ears: Normal auditory acuity. Mouth: Dentition intact. No ulcers or lesions.  Lungs: Clear throughout to auscultation. Heart: Regular rate and rhythm, no murmur. Abdomen: Soft, nontender and nondistended. No masses or hepatomegaly. Normal bowel sounds x 4 quadrants.  Rectal: Deferred.  Musculoskeletal: Symmetrical with no gross deformities. Extremities: No edema. Neurological: Alert oriented x 4. No focal deficits.  Psychological: Alert and cooperative. Normal mood and affect  Assessment and Recommendations:  56) 50 year old male with a history of GERD with nausea without vomiting which was triggered after Dexilant was discontinued 4 months ago as his insurance no longer cover the cost of this medication.  Early satiety.  Infrequent nighttime heartburn.  No dysphagia or abdominal pain.  -RUQ sonogram to rule out gallstones -EGD at time of colonoscopy to rule out gastritis, Dr. Havery Moros to verify if repeat EGD warranted -Consider gastric emptying study if the above evaluation is unrevealing -Omeprazole 20 mg 1 p.o. twice daily.  Continue Baclofen at bedtime. -CBC, CMP  2) History of a 4 mm tubular adenomatous polyp per colonoscopy 03/2016 with 5 yr recall. Maternal aunt with history of  colon cancer.  -Colonoscopy benefits and risks discussed including risk with sedation, risk of bleeding, perforation and infection   3) Possible sleep apnea, scheduled for pulmonary consult for further evaluation 08/16/2021  Further recommendations to be determined after the above evaluation completed

## 2021-07-19 NOTE — Patient Instructions (Signed)
PROCEDURES: You have been scheduled for an EGD and Colonoscopy. Please follow the written instructions given to you at your visit today. Please pick up your prep supplies at the pharmacy within the next 1-3 days. If you use inhalers (even only as needed), please bring them with you on the day of your procedure.  IMAGING: You will be contacted by [facility] (Your caller ID will indicate phone # [phone]) in the next 2 days to schedule your Ultrasound . If you have not heard from them within 2 business days, please call [facility] at [phone] to follow up on the status of your appointment.    MEDICATION: We have sent the following medication to your pharmacy for you to pick up at your convenience: Ondansetron (ZOFRAN ODT) 4 MG disintegrating tablet- Place one tablet under the tongue every 6 hours as needed for nausea or vomiting. Omeprazole 20 Mg tablet, take 1 tablet 30 minutes before breakfast and dinner.  LABS:  Lab work has been ordered for you today. Our lab is located in the basement. Press "B" on the elevator. The lab is located at the first door on the left as you exit the elevator.  HEALTHCARE LAWS AND MY CHART RESULTS: Due to recent changes in healthcare laws, you may see the results of your imaging and laboratory studies on MyChart before your provider has had a chance to review them.   We understand that in some cases there may be results that are confusing or concerning to you. Not all laboratory results come back in the same time frame and the provider may be waiting for multiple results in order to interpret others.  Please give [place] 48 hours in order for your provider to thoroughly review all the results before contacting the office for clarification of your results.   It was great seeing you today! Thank you for entrusting me with your care and choosing [facility].  Noralyn Pick, CRNP  The [facility]  providers would like to encourage you to use [facility] to communicate with providers for non-urgent requests or questions.  Due to long hold times on the telephone, sending your provider a message by [facility] may be faster and more efficient way to get a response. Please allow 48 business hours for a response.  Please remember that this is for non-urgent requests/questions. If you are age 57 or older, your body mass index should be between 23-30. Your Body mass index is 37.15 kg/m. If this is out of the aforementioned range listed, please consider follow up with your Primary Care Provider.  If you are age 51 or younger, your body mass index should be between 19-25. Your Body mass index is 37.15 kg/m. If this is out of the aformentioned range listed, please consider follow up with your Primary Care Provider.

## 2021-07-19 NOTE — Progress Notes (Signed)
Agree with assessment with the following thoughts: Based on recent guideline changes for surveillance of polyps - he does not warrant a colonoscopy until 03/2023 (7 years from his last exam). His aunt does not influence frequency of his colonoscopy. I don't think he needs a colonoscopy until 03/2023 unless new bowel changes, if you can cancel that. Up to him if he wishes to see how he does with higher dose PPI first, or if he wishes to proceed with EGD now. Given early satiety, EGD is reasonable.   Thanks

## 2021-07-19 NOTE — Progress Notes (Signed)
RADIOLOGY SCHEDULING REQUEST FOR [facility] [place] [facility] Scheduling via secure staff message.

## 2021-07-20 ENCOUNTER — Telehealth: Payer: Self-pay

## 2021-07-20 NOTE — Progress Notes (Signed)
Noted  

## 2021-07-20 NOTE — Progress Notes (Signed)
Beth, please contact the patient and let him know Dr. Havery Moros reviewed his office consult 07/19/2021 and based on the new guidelines for surveillance of colon polyps he does not require a colonoscopy until 03/2023 so please cancel his scheduled colonoscopy and enter a new colonoscopy recall for 03/2023.  However, the patient should contact her office if he has any change in bowel pattern or rectal bleeding prior to his new colonoscopy recall date.  Regarding the EGD, Dr. Havery Moros assessed it was reasonable to proceed with an EGD for further evaluation but if the patient wants to see how he responds to the higher dose PPI for a few weeks before proceeding with an EGD that is reasonable as well.  Thank you

## 2021-07-20 NOTE — Telephone Encounter (Addendum)
Patient contacted. Agrees with recommendations and the plan. He choses to keep the EGD appointment on 08/04/21.  ----- Message from Noralyn Pick, NP sent at 07/20/2021  8:27 AM EDT -----  Eustaquio Maize, please contact the patient and let him know Dr. Havery Moros reviewed his office consult 07/19/2021 and based on the new guidelines for surveillance of colon polyps he does not require a colonoscopy until 03/2023 so please cancel his scheduled colonoscopy and enter a new colonoscopy recall for 03/2023.  However, the patient should contact her office if he has any change in bowel pattern or rectal bleeding prior to his new colonoscopy recall date.  Regarding the EGD, Dr. Havery Moros assessed it was reasonable to proceed with an EGD for further evaluation but if the patient wants to see how he responds to the higher dose PPI for a few weeks before proceeding with an EGD that is reasonable as well.  Thank you

## 2021-08-02 ENCOUNTER — Ambulatory Visit (HOSPITAL_COMMUNITY)
Admission: RE | Admit: 2021-08-02 | Discharge: 2021-08-02 | Disposition: A | Discharge status: Home / Self Care | Payer: 59 | Source: Ambulatory Visit | Attending: Nurse Practitioner | Admitting: Nurse Practitioner

## 2021-08-02 ENCOUNTER — Other Ambulatory Visit: Payer: Self-pay

## 2021-08-02 DIAGNOSIS — Z86010 Personal history of colonic polyps: Secondary | ICD-10-CM | POA: Insufficient documentation

## 2021-08-02 DIAGNOSIS — R110 Nausea: Secondary | ICD-10-CM | POA: Diagnosis present

## 2021-08-02 DIAGNOSIS — K219 Gastro-esophageal reflux disease without esophagitis: Secondary | ICD-10-CM | POA: Diagnosis not present

## 2021-08-04 ENCOUNTER — Encounter: Payer: 59 | Admitting: Gastroenterology

## 2021-08-09 ENCOUNTER — Telehealth: Payer: Self-pay | Admitting: Pulmonary Disease

## 2021-08-09 NOTE — Telephone Encounter (Signed)
Patient scheduled 08/16/21 for sleep consult with Dr. Ander Slade.   I called and spoke with Patient.  Patient has never had any kind of sleep study.

## 2021-08-10 ENCOUNTER — Encounter: Payer: 59 | Admitting: Gastroenterology

## 2021-08-15 ENCOUNTER — Other Ambulatory Visit: Payer: Self-pay | Admitting: Family Medicine

## 2021-08-16 ENCOUNTER — Other Ambulatory Visit: Payer: Self-pay

## 2021-08-16 ENCOUNTER — Ambulatory Visit (INDEPENDENT_AMBULATORY_CARE_PROVIDER_SITE_OTHER): Payer: 59 | Admitting: Pulmonary Disease

## 2021-08-16 ENCOUNTER — Encounter: Payer: Self-pay | Admitting: Pulmonary Disease

## 2021-08-16 VITALS — BP 118/80 | HR 76 | Temp 98.4°F | Ht 69.0 in | Wt 254.0 lb

## 2021-08-16 DIAGNOSIS — R0683 Snoring: Secondary | ICD-10-CM | POA: Diagnosis not present

## 2021-08-16 DIAGNOSIS — G4719 Other hypersomnia: Secondary | ICD-10-CM

## 2021-08-16 NOTE — Progress Notes (Signed)
Andrew Sharp    [id number]    [birth year]  Primary Care Physician:Fry, Ishmael Holter, MD  Referring Physician: Laurey Morale, MD [place],   [id number]  Chief complaint:   History of snoring, witnessed apneas  HPI:  Snoring, witnessed apneas, daytime fatigue Usually goes to bed between 11, wakes up 7 AM Takes him about 30 minutes to fall asleep 3-4 awakenings Weight has been relatively stable  No family history of obstructive sleep apnea known to him  He does admit to dryness of his mouth in the morning Occasional sore throat No morning headaches No night sweats  Never smoker  No previous sleep studies  He does have hypertension that is well controlled  Memory is fair   Outpatient Encounter Medications as of 08/16/2021  Medication Sig  . amLODipine (NORVASC) 10 MG tablet TAKE 1 TABLET BY MOUTH EVERY DAY  . baclofen (LIORESAL) 10 MG tablet Take 1 tablet (10 mg total) by mouth at bedtime.  Marland Kitchen buPROPion (WELLBUTRIN XL) 150 MG 24 hr tablet TAKE 1 TABLET BY MOUTH DAILY  . cloNIDine (CATAPRES) 0.1 MG tablet TAKE 1 TABLET(0.1 MG) BY MOUTH TWICE DAILY  . hydrALAZINE (APRESOLINE) 100 MG tablet Take 1 tablet (100 mg total) by mouth 3 (three) times daily.  Marland Kitchen losartan (COZAAR) 100 MG tablet TAKE 1 TABLET(100 MG) BY MOUTH DAILY  . metoprolol succinate (TOPROL-XL) 100 MG 24 hr tablet TAKE 1 TABLET BY MOUTH EVERY DAY WITH OR IMMEDIATELY FOLLOWING A MEAL  . omeprazole (PRILOSEC) 20 MG capsule Take 1 capsule (20 mg total) by mouth 2 (two) times daily before a meal.  . ondansetron (ZOFRAN-ODT) 4 MG disintegrating tablet Dissolve one tablet on the tongue every 6 hours as needed for nausea and vomiting.  . potassium chloride SA (KLOR-CON) 20 MEQ tablet TAKE 1 TABLET(20 MEQ) BY MOUTH THREE TIMES DAILY  . spironolactone (ALDACTONE) 50 MG tablet TAKE 1 TABLET(50 MG) BY MOUTH DAILY  . [DISCONTINUED] PEG-KCl-NaCl-NaSulf-Na Asc-C (PLENVU) 140 g SOLR Use as  directed for colonoscopy prep.[id number] XBM:[id number]:[id number]  . [DISCONTINUED] potassium chloride SA (KLOR-CON) 20 MEQ tablet TAKE 1 TABLET(20 MEQ) BY MOUTH THREE TIMES DAILY   No facility-administered encounter medications on file as of 08/16/2021.    Allergies as of 08/16/2021  . (No Known Allergies)    Past Medical History:  Diagnosis Date  . Esophageal stricture   . GERD (gastroesophageal reflux disease)   . History of kidney stones   . Hypertension     Past Surgical History:  Procedure Laterality Date  . COLONOSCOPY  03-16-06   per Dr. Deatra Ina, nonspecific colitis and int. hemorrhoids  . CT CORONARY CA SCORING  09-19-11   for chest pains, negative   . KNEE ARTHROPLASTY Left     Family History  Problem Relation Age of Onset  . Colon cancer Maternal Aunt   . Pancreatic cancer Maternal Grandmother   . Crohn's disease Other        niece  . Diabetes Brother        MOTHER,AUNT  . Heart disease Mother   . Kidney disease Mother   . Prostate cancer Maternal Grandfather   . Esophageal cancer Neg Hx   . Rectal cancer Neg Hx   . Stomach cancer Neg Hx     Social History   Socioeconomic History  . Marital status: Married    Spouse name: Not on file  . Number of children: 1  .  Years of education: Not on file  . Highest education level: Not on file  Occupational History  . Occupation: MANAGER    Employer: [employer]   Tobacco Use  . Smoking status: Never  . Smokeless tobacco: Never  Vaping Use  . Vaping Use: Never used  Substance and Sexual Activity  . Alcohol use: No    Alcohol/week: 0.0 standard drinks  . Drug use: No  . Sexual activity: Not on file  Other Topics Concern  . Not on file  Social History Narrative  . Not on file   Social Determinants of Health   Financial Resource Strain: Not on file  Food Insecurity: Not on file  Transportation Needs: Not on file  Physical Activity: Not on file  Stress: Not on file   Social Connections: Not on file  Intimate Partner Violence: Not on file    Review of Systems  Constitutional:  Positive for fatigue.  Psychiatric/Behavioral:  Positive for sleep disturbance.    Vitals:   08/16/21 1430  BP: 118/80  Pulse: 76  Temp: 98.4 F (36.9 C)  SpO2: 99%     Physical Exam Constitutional:      Appearance: He is obese.  HENT:     Head: Normocephalic.     Nose: Nose normal.     Mouth/Throat:     Mouth: Mucous membranes are moist.     Comments: Mallampati 3, crowded oropharynx Eyes:     Pupils: Pupils are equal, round, and reactive to light.  Cardiovascular:     Rate and Rhythm: Normal rate and regular rhythm.     Heart sounds: No murmur heard.   No friction rub.  Pulmonary:     Effort: No respiratory distress.     Breath sounds: No stridor. No wheezing or rhonchi.  Musculoskeletal:     Cervical back: No rigidity or tenderness.  Neurological:     Mental Status: He is alert.  Psychiatric:        Mood and Affect: Mood normal.    No flowsheet data found. Epworth Sleepiness Scale of 18  Assessment:  Excessive daytime sleepiness  Obesity  Daytime fatigue  Witnessed apneas, significant snoring  Pathophysiology of sleep disordered breathing discussed with the patient Treatment options discussed with the patient  Plan/Recommendations: Schedule patient for home sleep study  Weight loss/efforts encouraged  Will update with results as soon as reviewed  Treatment options already discussed  Tentative follow-up in 3 to 4 months   Sherrilyn Rist MD [facility] 08/16/2021, 2:56 PM  CC: Laurey Morale, MD

## 2021-08-16 NOTE — Patient Instructions (Signed)
Moderate Fogerty of significant obstructive sleep apnea  We will schedule you for home sleep study Will update you with  results as soon as reviewed  Tentative follow-up in 3 to 4 months  Encourage weight loss efforts  Call with significant concerns  Sleep Apnea Sleep apnea affects breathing during sleep. It causes breathing to stop for 10 seconds or more, or to become shallow. People with sleep apnea usually snore loudly. It can also increase the risk of: Heart attack. Stroke. Being very overweight (obese). Diabetes. Heart failure. Irregular heartbeat. High blood pressure. The goal of treatment is to help you breathe normally again. What are the causes? The most common cause of this condition is a collapsed or blocked airway. There are three kinds of sleep apnea: Obstructive sleep apnea. This is caused by a blocked or collapsed airway. Central sleep apnea. This happens when the brain does not send the right signals to the muscles that control breathing. Mixed sleep apnea. This is a combination of obstructive and central sleep apnea. What increases the risk? Being overweight. Smoking. Having a small airway. Being older. Being male. Drinking alcohol. Taking medicines to calm yourself (sedatives or tranquilizers). Having family members with the condition. Having a tongue or tonsils that are larger than normal. What are the signs or symptoms? Trouble staying asleep. Loud snoring. Headaches in the morning. Waking up gasping. Dry mouth or sore throat in the morning. Being sleepy or tired during the day. If you are sleepy or tired during the day, you may also: Not be able to focus your mind (concentrate). Forget things. Get angry a lot and have mood swings. Feel sad (depressed). Have changes in your personality. Have less interest in sex, if you are male. Be unable to have an erection, if you are male. How is this treated?  Sleeping on your side. Using a medicine  to get rid of mucus in your nose (decongestant). Avoiding the use of alcohol, medicines to help you relax, or certain pain medicines (narcotics). Losing weight, if needed. Changing your diet. Quitting smoking. Using a machine to open your airway while you sleep, such as: An oral appliance. This is a mouthpiece that shifts your lower jaw forward. A CPAP device. This device blows air through a mask when you breathe out (exhale). An EPAP device. This has valves that you put in each nostril. A BPAP device. This device blows air through a mask when you breathe in (inhale) and breathe out. Having surgery if other treatments do not work. Follow these instructions at home: Lifestyle Make changes that your doctor recommends. Eat a healthy diet. Lose weight if needed. Avoid alcohol, medicines to help you relax, and some pain medicines. Do not smoke or use any products that contain nicotine or tobacco. If you need help quitting, ask your doctor. General instructions Take over-the-counter and prescription medicines only as told by your doctor. If you were given a machine to use while you sleep, use it only as told by your doctor. If you are having surgery, make sure to tell your doctor you have sleep apnea. You may need to bring your device with you. Keep all follow-up visits. Contact a doctor if: The machine that you were given to use during sleep bothers you or does not seem to be working. You do not get better. You get worse. Get help right away if: Your chest hurts. You have trouble breathing in enough air. You have an uncomfortable feeling in your back, arms, or stomach. You  have trouble talking. One side of your body feels weak. A part of your face is hanging down. These symptoms may be an emergency. Get help right away. Call your local emergency services (911 in the [place] Do not wait to see if the symptoms will go away. Do not drive yourself to the hospital. Summary This condition  affects breathing during sleep. The most common cause is a collapsed or blocked airway. The goal of treatment is to help you breathe normally while you sleep. This information is not intended to replace advice given to you by your health care provider. Make sure you discuss any questions you have with your health care provider. Document Revised: 09/17/2020 Document Reviewed: 09/17/2020 Elsevier Patient Education  2022 [employer].

## 2021-08-16 NOTE — Addendum Note (Signed)
Addended by: Dessie Coma on: 08/16/2021 03:05 PM   Modules accepted: Orders

## 2021-08-23 ENCOUNTER — Other Ambulatory Visit: Payer: Self-pay | Admitting: Family Medicine

## 2021-08-31 ENCOUNTER — Other Ambulatory Visit: Payer: Self-pay | Admitting: Family Medicine

## 2021-09-01 ENCOUNTER — Other Ambulatory Visit: Payer: Self-pay | Admitting: Family Medicine

## 2021-09-07 ENCOUNTER — Other Ambulatory Visit: Payer: Self-pay

## 2021-09-07 MED ORDER — CLONIDINE HCL 0.1 MG PO TABS: ORAL_TABLET | ORAL | 1 refills | Status: DC

## 2021-09-27 ENCOUNTER — Other Ambulatory Visit: Payer: Self-pay | Admitting: Nurse Practitioner

## 2021-09-27 ENCOUNTER — Other Ambulatory Visit: Payer: Self-pay | Admitting: Family Medicine

## 2021-09-28 ENCOUNTER — Other Ambulatory Visit: Payer: Self-pay | Admitting: Nurse Practitioner

## 2021-10-31 ENCOUNTER — Ambulatory Visit (INDEPENDENT_AMBULATORY_CARE_PROVIDER_SITE_OTHER): Payer: 59

## 2021-10-31 ENCOUNTER — Other Ambulatory Visit: Payer: Self-pay

## 2021-10-31 DIAGNOSIS — G4719 Other hypersomnia: Secondary | ICD-10-CM

## 2021-10-31 DIAGNOSIS — G4733 Obstructive sleep apnea (adult) (pediatric): Secondary | ICD-10-CM | POA: Diagnosis not present

## 2021-10-31 DIAGNOSIS — R0683 Snoring: Secondary | ICD-10-CM

## 2021-11-04 ENCOUNTER — Telehealth: Payer: Self-pay | Admitting: Pulmonary Disease

## 2021-11-04 DIAGNOSIS — G4733 Obstructive sleep apnea (adult) (pediatric): Secondary | ICD-10-CM

## 2021-11-04 NOTE — Telephone Encounter (Signed)
Call patient  Sleep study result  Date of study: 10/31/2021  Impression: Mild obstructive sleep apnea Mild oxygen desaturations  Recommendation:  Options of treatment for mild obstructive sleep apnea  CPAP may be considered option of treatment if patient has significant daytime symptoms  If CPAP therapy is chosen as option of treatment, auto titrating CPAP with pressure settings of 5-15 will be appropriate  An oral device may also be considered as an option of treatment for mild sleep disordered breathing  Watchful waiting with focus on weight loss and sleep position modification may also be appropriate if not symptomatic.   Weight loss measures encouraged  Schedule for follow-up as previously discussed

## 2021-11-08 NOTE — Telephone Encounter (Signed)
Called and spoke with patient. He verbalized understanding. He wants to discuss the results and recommendations with his wife. He will call us back once he has made a decision.   Will route to [facility] for follow up.

## 2021-11-27 ENCOUNTER — Other Ambulatory Visit: Payer: Self-pay | Admitting: Family Medicine

## 2021-11-28 ENCOUNTER — Other Ambulatory Visit: Payer: Self-pay | Admitting: Nurse Practitioner

## 2021-11-30 ENCOUNTER — Telehealth: Payer: Self-pay | Admitting: Pulmonary Disease

## 2021-11-30 DIAGNOSIS — G4733 Obstructive sleep apnea (adult) (pediatric): Secondary | ICD-10-CM

## 2021-12-02 NOTE — Telephone Encounter (Signed)
Called and spoke with patient. He stated that he has decided to go ahead with the cpap order. I advised him that I would go ahead and place the order. He verbalized understanding.   Dr. Halford Chessman, would you be ok with signing the order on behalf of Dr. Ander Slade?

## 2021-12-02 NOTE — Telephone Encounter (Signed)
Noted.  Will close encounter.  

## 2021-12-02 NOTE — Telephone Encounter (Signed)
I have signed order. 

## 2021-12-26 ENCOUNTER — Other Ambulatory Visit: Payer: Self-pay | Admitting: Family Medicine

## 2021-12-28 ENCOUNTER — Other Ambulatory Visit: Payer: Self-pay | Admitting: Nurse Practitioner

## 2022-01-28 ENCOUNTER — Other Ambulatory Visit: Payer: Self-pay | Admitting: Nurse Practitioner

## 2022-01-29 ENCOUNTER — Other Ambulatory Visit: Payer: Self-pay | Admitting: Family Medicine

## 2022-02-16 ENCOUNTER — Encounter: Payer: Self-pay | Admitting: Pulmonary Disease

## 2022-02-17 NOTE — Telephone Encounter (Signed)
Received the following message from patient:  ? ?"I have been using my machine for almost 2 month and I am still always tired. So i need to know what to do, thanks" ? ?I have placed a copy of his cpap download in AO's review folder. AO, can you please advise? Thanks!  ?

## 2022-02-20 ENCOUNTER — Telehealth: Payer: Self-pay | Admitting: Pulmonary Disease

## 2022-02-20 ENCOUNTER — Ambulatory Visit: Payer: 59 | Admitting: Family Medicine

## 2022-02-20 ENCOUNTER — Encounter: Payer: Self-pay | Admitting: Family Medicine

## 2022-02-20 VITALS — BP 110/76 | HR 69 | Temp 98.1°F | Wt 267.0 lb

## 2022-02-20 DIAGNOSIS — R5383 Other fatigue: Secondary | ICD-10-CM

## 2022-02-20 DIAGNOSIS — R6882 Decreased libido: Secondary | ICD-10-CM

## 2022-02-20 DIAGNOSIS — R739 Hyperglycemia, unspecified: Secondary | ICD-10-CM | POA: Diagnosis not present

## 2022-02-20 DIAGNOSIS — I10 Essential (primary) hypertension: Secondary | ICD-10-CM | POA: Diagnosis not present

## 2022-02-20 LAB — HEPATIC FUNCTION PANEL
ALT: 31 U/L (ref 0–53)
AST: 29 U/L (ref 0–37)
Albumin: 4.3 g/dL (ref 3.5–5.2)
Alkaline Phosphatase: 54 U/L (ref 39–117)
Bilirubin, Direct: 0.1 mg/dL (ref 0.0–0.3)
Total Bilirubin: 0.4 mg/dL (ref 0.2–1.2)
Total Protein: 7.7 g/dL (ref 6.0–8.3)

## 2022-02-20 LAB — CBC WITH DIFFERENTIAL/PLATELET
Basophils Absolute: 0.1 10*3/uL (ref 0.0–0.1)
Basophils Relative: 0.8 % (ref 0.0–3.0)
Eosinophils Absolute: 0.3 10*3/uL (ref 0.0–0.7)
Eosinophils Relative: 3.9 % (ref 0.0–5.0)
HCT: 39.7 % (ref 39.0–52.0)
Hemoglobin: 13.7 g/dL (ref 13.0–17.0)
Lymphocytes Relative: 30.9 % (ref 12.0–46.0)
Lymphs Abs: 2.5 10*3/uL (ref 0.7–4.0)
MCHC: 34.4 g/dL (ref 30.0–36.0)
MCV: 92.6 fl (ref 78.0–100.0)
Monocytes Absolute: 1 10*3/uL (ref 0.1–1.0)
Monocytes Relative: 11.6 % (ref 3.0–12.0)
Neutro Abs: 4.4 10*3/uL (ref 1.4–7.7)
Neutrophils Relative %: 52.8 % (ref 43.0–77.0)
Platelets: 231 10*3/uL (ref 150.0–400.0)
RBC: 4.29 Mil/uL (ref 4.22–5.81)
RDW: 13.5 % (ref 11.5–15.5)
WBC: 8.2 10*3/uL (ref 4.0–10.5)

## 2022-02-20 LAB — VITAMIN B12: Vitamin B-12: >1504 pg/mL — ABNORMAL HIGH (ref 211–911)

## 2022-02-20 LAB — BASIC METABOLIC PANEL
BUN: 15 mg/dL (ref 6–23)
CO2: 26 mEq/L (ref 19–32)
Calcium: 9.3 mg/dL (ref 8.4–10.5)
Chloride: 104 mEq/L (ref 96–112)
Creatinine, Ser: 1.56 mg/dL — ABNORMAL HIGH (ref 0.40–1.50)
GFR: 51.24 mL/min — ABNORMAL LOW (ref >60.00)
Glucose, Bld: 105 mg/dL — ABNORMAL HIGH (ref 70–99)
Potassium: 4 mEq/L (ref 3.5–5.1)
Sodium: 137 mEq/L (ref 135–145)

## 2022-02-20 LAB — TESTOSTERONE: Testosterone: 360.14 ng/dL (ref 300.00–890.00)

## 2022-02-20 LAB — TSH: TSH: 2.22 u[IU]/mL (ref 0.35–5.50)

## 2022-02-20 LAB — HEMOGLOBIN A1C: Hgb A1c MFr Bld: 5.8 % (ref 4.6–6.5)

## 2022-02-20 NOTE — Telephone Encounter (Signed)
CPAP compliance was reviewed showing almost nightly use Average use of 7 hours 25 minutes Machine set between 5 and 15 Residual AHI 1.9  CPAP is optimally treating sleep apnea  This is well treated sleep apnea It does in some people take a little bit longer to see the effect of better sleep  Some people still have residual sleepiness despite adequate treatment of sleep apnea, the treatment in this situation will be a stimulant which one has to be careful off in somebody with heart disease or hypertension-if not well controlled  Regular exercises, weight loss efforts Caffeinated beverages as tolerated

## 2022-02-20 NOTE — Progress Notes (Signed)
? ?  Subjective:  ? ? Patient ID: Andrew Sharp, male    DOB: Aug 18, 1971, 51 y.o.   MRN: [id number] ? ?HPI ?Here to discuss chronic fatigue. This has been bothering him about a year. He says he sleeps well. He is using his CPAP consistently. No other symptoms. His BP has been well controlled.  ? ? ?Review of Systems  ?Constitutional:  Positive for fatigue.  ?Respiratory: Negative.    ?Cardiovascular: Negative.   ?Gastrointestinal: Negative.   ?Endocrine: Negative.   ?Genitourinary: Negative.   ? ?   ?Objective:  ? Physical Exam ?Constitutional:   ?   Appearance: Normal appearance. He is not ill-appearing.  ?Cardiovascular:  ?   Rate and Rhythm: Normal rate and regular rhythm.  ?   Pulses: Normal pulses.  ?   Heart sounds: Normal heart sounds.  ?Pulmonary:  ?   Effort: Pulmonary effort is normal.  ?   Breath sounds: Normal breath sounds.  ?Neurological:  ?   General: No focal deficit present.  ?   Mental Status: He is alert and oriented to person, place, and time.  ? ? ? ? ? ?   ?Assessment & Plan:  ?Chronic fatigue of uncertain etiology. We will screen with labs today to look for possible etiologies.  ?Alysia Penna, MD ? ? ?

## 2022-02-21 ENCOUNTER — Encounter: Payer: Self-pay | Admitting: Family Medicine

## 2022-02-21 NOTE — Addendum Note (Signed)
Addended by: Alysia Penna A on: 02/21/2022 08:01 AM ? ? Modules accepted: Orders ? ?

## 2022-02-22 ENCOUNTER — Other Ambulatory Visit: Payer: Self-pay

## 2022-02-22 DIAGNOSIS — Z87442 Personal history of urinary calculi: Secondary | ICD-10-CM

## 2022-02-22 NOTE — Addendum Note (Signed)
Addended by: Otilio Miu on: 02/22/2022 10:25 AM ? ? Modules accepted: Orders ? ?

## 2022-02-26 ENCOUNTER — Other Ambulatory Visit: Payer: Self-pay | Admitting: Family Medicine

## 2022-03-13 ENCOUNTER — Other Ambulatory Visit (INDEPENDENT_AMBULATORY_CARE_PROVIDER_SITE_OTHER): Payer: 59

## 2022-03-13 DIAGNOSIS — Z87442 Personal history of urinary calculi: Secondary | ICD-10-CM

## 2022-03-13 DIAGNOSIS — R6882 Decreased libido: Secondary | ICD-10-CM

## 2022-03-13 NOTE — Addendum Note (Signed)
Addended by: Octavio Manns E on: 03/13/2022 03:09 PM   Modules accepted: Orders

## 2022-03-14 ENCOUNTER — Encounter: Payer: Self-pay | Admitting: Family Medicine

## 2022-03-14 LAB — BASIC METABOLIC PANEL
BUN: 15 mg/dL (ref 6–23)
CO2: 25 mEq/L (ref 19–32)
Calcium: 9.5 mg/dL (ref 8.4–10.5)
Chloride: 105 mEq/L (ref 96–112)
Creatinine, Ser: 1.65 mg/dL — ABNORMAL HIGH (ref 0.40–1.50)
GFR: 47.89 mL/min — ABNORMAL LOW (ref >60.00)
Glucose, Bld: 83 mg/dL (ref 70–99)
Potassium: 4.1 mEq/L (ref 3.5–5.1)
Sodium: 137 mEq/L (ref 135–145)

## 2022-03-14 LAB — TESTOSTERONE: Testosterone: 342.11 ng/dL (ref 300.00–890.00)

## 2022-03-15 NOTE — Telephone Encounter (Signed)
See my Result Note for those labs

## 2022-03-15 NOTE — Telephone Encounter (Signed)
I understand. We can recheck the testosterone whenever he wants to

## 2022-03-23 ENCOUNTER — Encounter: Payer: Self-pay | Admitting: Family Medicine

## 2022-03-23 NOTE — Telephone Encounter (Signed)
He needs an OV

## 2022-03-24 ENCOUNTER — Other Ambulatory Visit: Payer: Self-pay | Admitting: Family Medicine

## 2022-03-24 DIAGNOSIS — I10 Essential (primary) hypertension: Secondary | ICD-10-CM

## 2022-03-26 ENCOUNTER — Other Ambulatory Visit: Payer: Self-pay | Admitting: Nurse Practitioner

## 2022-05-16 ENCOUNTER — Other Ambulatory Visit: Payer: Self-pay | Admitting: Family Medicine

## 2022-05-16 ENCOUNTER — Other Ambulatory Visit: Payer: Self-pay | Admitting: Nurse Practitioner

## 2022-06-06 ENCOUNTER — Other Ambulatory Visit: Payer: Self-pay | Admitting: Nurse Practitioner

## 2022-06-16 ENCOUNTER — Other Ambulatory Visit: Payer: Self-pay

## 2022-06-16 ENCOUNTER — Other Ambulatory Visit: Payer: Self-pay | Admitting: Family Medicine

## 2022-06-16 ENCOUNTER — Other Ambulatory Visit: Payer: Self-pay | Admitting: Nurse Practitioner

## 2022-06-16 DIAGNOSIS — I10 Essential (primary) hypertension: Secondary | ICD-10-CM

## 2022-07-01 ENCOUNTER — Other Ambulatory Visit: Payer: Self-pay | Admitting: Family Medicine

## 2022-07-14 ENCOUNTER — Other Ambulatory Visit: Payer: Self-pay | Admitting: Nurse Practitioner

## 2022-07-17 ENCOUNTER — Ambulatory Visit: Payer: 59 | Admitting: Physical Medicine and Rehabilitation

## 2022-07-18 ENCOUNTER — Ambulatory Visit: Payer: 59 | Admitting: Physical Medicine and Rehabilitation

## 2022-07-18 ENCOUNTER — Encounter: Payer: Self-pay | Admitting: Physical Medicine and Rehabilitation

## 2022-07-18 DIAGNOSIS — M5416 Radiculopathy, lumbar region: Secondary | ICD-10-CM

## 2022-07-18 DIAGNOSIS — M47816 Spondylosis without myelopathy or radiculopathy, lumbar region: Secondary | ICD-10-CM

## 2022-07-18 DIAGNOSIS — M4726 Other spondylosis with radiculopathy, lumbar region: Secondary | ICD-10-CM | POA: Diagnosis not present

## 2022-07-18 DIAGNOSIS — M48062 Spinal stenosis, lumbar region with neurogenic claudication: Secondary | ICD-10-CM | POA: Diagnosis not present
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
## 2022-07-18 NOTE — Progress Notes (Unsigned)
Numeric Pain Rating Scale and Functional Assessment Average Pain 5   In the last MONTH (on 0-10 scale) has pain interfered with the following?  1. General activity like being  able to carry out your everyday physical activities such as walking, climbing stairs, carrying groceries, or moving a chair?  Rating(8)  Lower back pain with radiculopathy down right leg with numbness, tingling, and occasional burning. +Driver, -BT, -Dye Allergies.

## 2022-07-27 ENCOUNTER — Ambulatory Visit: Payer: 59 | Admitting: Pulmonary Disease

## 2022-07-27 ENCOUNTER — Encounter: Payer: Self-pay | Admitting: Pulmonary Disease

## 2022-07-27 VITALS — BP 122/80 | HR 73 | Temp 98.1°F | Ht 69.0 in | Wt 259.6 lb

## 2022-07-27 DIAGNOSIS — G4719 Other hypersomnia: Secondary | ICD-10-CM | POA: Diagnosis not present

## 2022-07-27 DIAGNOSIS — G4733 Obstructive sleep apnea (adult) (pediatric): Secondary | ICD-10-CM | POA: Diagnosis not present

## 2022-07-27 MED ORDER — MODAFINIL 100 MG PO TABS: 100.0000 mg | ORAL_TABLET | Freq: Every day | ORAL | 1 refills | Status: DC

## 2022-07-27 NOTE — Progress Notes (Signed)
Andrew Sharp    [id number]    24-Mar-1971  Primary Care Physician:Fry, Ishmael Holter, MD  Referring Physician: Laurey Morale, MD [place],  [place] [address]  Chief complaint:   History of snoring, witnessed apneas Diagnosed with sleep apnea and has been using CPAP  HPI: In for follow-up today for obstructive sleep apnea on CPAP therapy Still has significant daytime sleepiness despite using CPAP nightly Wakes up a couple times during the night able to go back to sleep  Denies any difficulty with tolerating CPAP  Tries to stay active Feels he sleeps well at night but during the day will get quite sleepy sometimes  Never smoker  He does have hypertension that is well controlled  Memory is fair   Outpatient Encounter Medications as of 07/27/2022  Medication Sig   amLODipine (NORVASC) 10 MG tablet TAKE 1 TABLET BY MOUTH EVERY DAY   baclofen (LIORESAL) 10 MG tablet TAKE 1 TABLET(10 MG) BY MOUTH AT BEDTIME   buPROPion (WELLBUTRIN XL) 150 MG 24 hr tablet TAKE 1 TABLET BY MOUTH DAILY   cloNIDine (CATAPRES) 0.1 MG tablet TAKE 1 TABLET(0.1 MG) BY MOUTH TWICE DAILY   hydrALAZINE (APRESOLINE) 100 MG tablet TAKE 1 TABLET(100 MG) BY MOUTH THREE TIMES DAILY   losartan (COZAAR) 100 MG tablet TAKE 1 TABLET(100 MG) BY MOUTH DAILY   metoprolol succinate (TOPROL-XL) 100 MG 24 hr tablet TAKE 1 TABLET BY MOUTH EVERY DAY WITH OR IMMEDIATELY FOLLOWING A MEAL   omeprazole (PRILOSEC) 20 MG capsule TAKE 1 CAPSULE(20 MG) BY MOUTH TWICE DAILY BEFORE MEALS   ondansetron (ZOFRAN-ODT) 4 MG disintegrating tablet Dissolve one tablet on the tongue every 6 hours as needed for nausea and vomiting.   spironolactone (ALDACTONE) 50 MG tablet TAKE 1 TABLET(50 MG) BY MOUTH DAILY   potassium chloride SA (KLOR-CON) 20 MEQ tablet TAKE 1 TABLET(20 MEQ) BY MOUTH THREE TIMES DAILY (Patient not taking: Reported on 07/27/2022)   No facility-administered encounter medications on file as of  07/27/2022.    Allergies as of 07/27/2022   (No Known Allergies)    Past Medical History:  Diagnosis Date   Esophageal stricture    GERD (gastroesophageal reflux disease)    History of kidney stones    Hypertension     Past Surgical History:  Procedure Laterality Date   COLONOSCOPY  03-16-06   per Dr. Deatra Ina, nonspecific colitis and int. hemorrhoids   CT CORONARY CA SCORING  09-19-11   for chest pains, negative    KNEE ARTHROPLASTY Left     Family History  Problem Relation Age of Onset   Colon cancer Maternal Aunt    Pancreatic cancer Maternal Grandmother    Crohn's disease Other        niece   Diabetes Brother        MOTHER,AUNT   Heart disease Mother    Kidney disease Mother    Prostate cancer Maternal Grandfather    Esophageal cancer Neg Hx    Rectal cancer Neg Hx    Stomach cancer Neg Hx     Social History   Socioeconomic History   Marital status: Married    Spouse name: Not on file   Number of children: 1   Years of education: Not on file   Highest education level: Not on file  Occupational History   Occupation: MANAGER    Employer: [employer]   Tobacco Use   Smoking status: Never  Smokeless tobacco: Never  Vaping Use   Vaping Use: Never used  Substance and Sexual Activity   Alcohol use: No    Alcohol/week: 0.0 standard drinks of alcohol   Drug use: No   Sexual activity: Not on file  Other Topics Concern   Not on file  Social History Narrative   Not on file   Social Determinants of Health   Financial Resource Strain: Not on file  Food Insecurity: Not on file  Transportation Needs: Not on file  Physical Activity: Not on file  Stress: Not on file  Social Connections: Not on file  Intimate Partner Violence: Not on file    Review of Systems  Constitutional:  Positive for fatigue.  Allergic/Immunologic: Positive for food allergies.  Psychiatric/Behavioral:  Positive for sleep disturbance.     Vitals:   07/27/22 0853  BP:  122/80  Pulse: 73  Temp: 98.1 F (36.7 C)  SpO2: 99%     Physical Exam Constitutional:      Appearance: He is obese.  HENT:     Head: Normocephalic.     Nose: Nose normal.     Mouth/Throat:     Mouth: Mucous membranes are moist.     Comments: Mallampati 3, crowded oropharynx Eyes:     Pupils: Pupils are equal, round, and reactive to light.  Cardiovascular:     Rate and Rhythm: Normal rate and regular rhythm.     Heart sounds: No murmur heard.    No friction rub.  Pulmonary:     Effort: No respiratory distress.     Breath sounds: No stridor. No wheezing or rhonchi.  Musculoskeletal:     Cervical back: No rigidity or tenderness.  Neurological:     Mental Status: He is alert.  Psychiatric:        Mood and Affect: Mood normal.    CPAP compliance shows 93% compliance with CPAP Average use of 7 hours 36 minutes Machine set between 5 and 15 95 percentile pressure of 11.2 AHI of 2.0  Assessment:   Mild obstructive sleep apnea Adequately treated with CPAP therapy  Still has excessive daytime sleepiness despite adequate use of CPAP  Sleep quality appears better  Pathophysiology of sleep disordered breathing discussed with the patient Treatment options discussed with the patient  Plan/Recommendations:  Encouraged to continue using CPAP on a nightly basis  Follow-up in 3 months  Prescription for Provigil 100 mg sent to pharmacy  Encouraged to call us with any significant concerns    Sherrilyn Rist MD [facility] 07/27/2022, 9:01 AM  CC: Laurey Morale, MD

## 2022-07-27 NOTE — Progress Notes (Signed)
Nuvigil

## 2022-07-27 NOTE — Patient Instructions (Signed)
Obstructive sleep apnea adequately treated with CPAP therapy Daytime sleepiness  Prescription for Provigil sent into pharmacy for you  If you derive some benefit from it but not adequate enough to keep you alert, you need to let us know so that we can call in a higher dose  I will see you 3 months from here  Call us with significant concerns

## 2022-08-03 ENCOUNTER — Ambulatory Visit: Payer: Self-pay

## 2022-08-03 ENCOUNTER — Ambulatory Visit: Payer: 59 | Admitting: Physical Medicine and Rehabilitation

## 2022-08-03 VITALS — BP 118/74 | HR 76

## 2022-08-03 DIAGNOSIS — M5416 Radiculopathy, lumbar region: Secondary | ICD-10-CM | POA: Diagnosis not present

## 2022-08-03 MED ORDER — METHYLPREDNISOLONE ACETATE 80 MG/ML IJ SUSP
80.0000 mg | Freq: Once | INTRAMUSCULAR | Status: AC
  Administered 2022-08-03: 80 mg

## 2022-08-03 NOTE — Patient Instructions (Signed)

## 2022-08-03 NOTE — Progress Notes (Signed)
Numeric Pain Rating Scale and Functional Assessment Average Pain 5   In the last MONTH (on 0-10 scale) has pain interfered with the following?  1. General activity like being  able to carry out your everyday physical activities such as walking, climbing stairs, carrying groceries, or moving a chair?  Rating(10)   +Driver, -BT, -Dye Allergies  General activities make pain worse. Pain radiates down right leg. Aleve for pain

## 2022-08-14 ENCOUNTER — Encounter: Payer: Self-pay | Admitting: Physical Medicine and Rehabilitation

## 2022-08-14 ENCOUNTER — Other Ambulatory Visit: Payer: Self-pay | Admitting: Physical Medicine and Rehabilitation

## 2022-08-14 DIAGNOSIS — M48062 Spinal stenosis, lumbar region with neurogenic claudication: Secondary | ICD-10-CM

## 2022-08-14 DIAGNOSIS — M5416 Radiculopathy, lumbar region: Secondary | ICD-10-CM

## 2022-08-14 NOTE — Progress Notes (Signed)
[name] - 51 y.o. male MRN [id number]  Date of birth: [birth year]  Office Visit Note: Visit Date: 08/03/2022 PCP: Laurey Morale, MD Referred by: Lorine Bears, NP  Subjective: Chief Complaint  Patient presents with   Lower Back - Pain   HPI:  Andrew Sharp is a 51 y.o. male who comes in today at the request of Barnet Pall, FNP for planned Right L4-5 Lumbar Interlaminar epidural steroid injection with fluoroscopic guidance.  The patient has failed conservative care including home exercise, medications, time and activity modification.  This injection will be diagnostic and hopefully therapeutic.  Please see requesting physician notes for further details and justification.  Please note in the procedure note that the patient has a very deep-seated spine between the iliac crest.  Lower level looks like it could be L5 but may actually be L4.  In the future would look at transforaminal injection.   ROS Otherwise per HPI.  Assessment & Plan: Visit Diagnoses:    ICD-10-CM   1. Lumbar radiculopathy  M54.16 XR C-ARM NO REPORT    Epidural Steroid injection    methylPREDNISolone acetate (DEPO-MEDROL) injection 80 mg      Plan: No additional findings.   Meds & Orders:  Meds ordered this encounter  Medications   methylPREDNISolone acetate (DEPO-MEDROL) injection 80 mg    Orders Placed This Encounter  Procedures   XR C-ARM NO REPORT   Epidural Steroid injection    Follow-up: Return for visit to requesting provider as needed.   Procedures: No procedures performed  Lumbar Epidural Steroid Injection - Interlaminar Approach with Fluoroscopic Guidance  Patient: Andrew Sharp      Date of Birth: [birth year] MRN: [id number] PCP: Laurey Morale, MD      Visit Date: 08/03/2022   Universal Protocol:     Consent Given By: the patient  Position: PRONE  Additional Comments: Vital signs were monitored before and after the procedure. Patient was prepped and  draped in the usual sterile fashion. The correct patient, procedure, and site was verified.   Injection Procedure Details:   Procedure diagnoses: Lumbar radiculopathy [M54.16]   Meds Administered:  Meds ordered this encounter  Medications   methylPREDNISolone acetate (DEPO-MEDROL) injection 80 mg     Laterality: Right  Location/Site:  L4-5   Needle: 4.5 in., 20 ga. Tuohy  Needle Placement: Paramedian epidural  Findings:   -Comments: Excellent flow of contrast into the epidural space.  Patient does not have listed on MRI transitional segment however he has a very deep-seated spine compared to the iliac crest bilaterally.  The injection level may be actually L3-4 based on that numbering scheme.  Would look at transforaminal approach in the future.  Procedure Details: Using a paramedian approach from the side mentioned above, the region overlying the inferior lamina was localized under fluoroscopic visualization and the soft tissues overlying this structure were infiltrated with 4 ml. of 1% Lidocaine without Epinephrine. The Tuohy needle was inserted into the epidural space using a paramedian approach.   The epidural space was localized using loss of resistance along with counter oblique bi-planar fluoroscopic views.  After negative aspirate for air, blood, and CSF, a 2 ml. volume of Isovue-250 was injected into the epidural space and the flow of contrast was observed. Radiographs were obtained for documentation purposes.    The injectate was administered into the level noted above.   Additional Comments:  The patient tolerated the procedure well Dressing: 2 x 2 sterile  gauze and Band-Aid    Post-procedure details: Patient was observed during the procedure. Post-procedure instructions were reviewed.  Patient left the clinic in stable condition.   Clinical History: MRI lumbar spine:   INDICATION: Low back pain   TECHNIQUE: Sagittal and axial T1 and T2-weighted sequences  were performed. Additional sagittal STIR images were performed.   COMPARISON: Lumbar spine x-rays 05/04/2022   FINDINGS:  #  Osseous structures: No compression fracture.  #  Alignment: Normal.  #  Modic changes: Absent  #  Conus medullaris: Normal. Terminates at L1-L2 with no evidence of tethering.  #  Lower thoracic segments: No significant abnormality.   #  L1-2: Unremarkable   #  L2-3: Unremarkable   #  L3-4: Unremarkable   #  L4-5: Moderate facet joint arthritis with some thickening of the ligament of flavum. Moderate spinal stenosis.   #  L5-S1: Moderate facet joint arthritis. No spinal or foraminal stenosis.    IMPRESSION:  Moderate spinal stenosis L4-5.   Electronically Signed by: Ross Marcus, MD on 05/15/2022 11:43 AM     Objective:  VS:  HT:    WT:   BMI:     BP:118/74  HR:76bpm  TEMP: ( )  RESP:  Physical Exam   Imaging: No results found.

## 2022-08-14 NOTE — Procedures (Signed)
Lumbar Epidural Steroid Injection - Interlaminar Approach with Fluoroscopic Guidance  Patient: Andrew Sharp      Date of Birth: April 01, 1971 MRN: [id number] PCP: Laurey Morale, MD      Visit Date: 08/03/2022   Universal Protocol:     Consent Given By: the patient  Position: PRONE  Additional Comments: Vital signs were monitored before and after the procedure. Patient was prepped and draped in the usual sterile fashion. The correct patient, procedure, and site was verified.   Injection Procedure Details:   Procedure diagnoses: Lumbar radiculopathy [M54.16]   Meds Administered:  Meds ordered this encounter  Medications   methylPREDNISolone acetate (DEPO-MEDROL) injection 80 mg     Laterality: Right  Location/Site:  L4-5   Needle: 4.5 in., 20 ga. Tuohy  Needle Placement: Paramedian epidural  Findings:   -Comments: Excellent flow of contrast into the epidural space.  Patient does not have listed on MRI transitional segment however he has a very deep-seated spine compared to the iliac crest bilaterally.  The injection level may be actually L3-4 based on that numbering scheme.  Would look at transforaminal approach in the future.  Procedure Details: Using a paramedian approach from the side mentioned above, the region overlying the inferior lamina was localized under fluoroscopic visualization and the soft tissues overlying this structure were infiltrated with 4 ml. of 1% Lidocaine without Epinephrine. The Tuohy needle was inserted into the epidural space using a paramedian approach.   The epidural space was localized using loss of resistance along with counter oblique bi-planar fluoroscopic views.  After negative aspirate for air, blood, and CSF, a 2 ml. volume of Isovue-250 was injected into the epidural space and the flow of contrast was observed. Radiographs were obtained for documentation purposes.    The injectate was administered into the level noted  above.   Additional Comments:  The patient tolerated the procedure well Dressing: 2 x 2 sterile gauze and Band-Aid    Post-procedure details: Patient was observed during the procedure. Post-procedure instructions were reviewed.  Patient left the clinic in stable condition.

## 2022-08-15 ENCOUNTER — Other Ambulatory Visit: Payer: Self-pay | Admitting: Family Medicine

## 2022-08-15 ENCOUNTER — Encounter: Payer: Self-pay | Admitting: Family Medicine

## 2022-08-16 NOTE — Telephone Encounter (Signed)
Yes there are several things he could try. Make an OV so we can discuss these

## 2022-08-18 ENCOUNTER — Ambulatory Visit: Payer: 59 | Admitting: Family Medicine

## 2022-08-18 MED ORDER — PHENTERMINE HCL 37.5 MG PO CAPS: 37.5000 mg | ORAL_CAPSULE | ORAL | 1 refills | Status: DC

## 2022-08-18 NOTE — Progress Notes (Signed)
   Subjective:    Patient ID: Andrew Sharp, male    DOB: 1970-11-21, 51 y.o.   MRN: [id number]  HPI Here asking for help to lose weight. He knows he should lose weight to help his HTN. He is also being treated for low back pain, and losing weight will help this as well. He has eliminated bread from his diet and he has lost 4 lbs in the past 4 weeks. His back pain limits the exercise he can do.    Review of Systems  Constitutional: Negative.   Respiratory: Negative.    Cardiovascular: Negative.   Musculoskeletal:  Positive for back pain.       Objective:   Physical Exam Constitutional:      Appearance: He is obese.  Cardiovascular:     Rate and Rhythm: Normal rate and regular rhythm.     Pulses: Normal pulses.     Heart sounds: Normal heart sounds.  Pulmonary:     Effort: Pulmonary effort is normal.     Breath sounds: Normal breath sounds.  Neurological:     Mental Status: He is alert.           Assessment & Plan:  Obesity. We will refer him to Nutrition for dietary education. He will try Phentermine 37.5 mg daily. Recheck in 3 months.  Alysia Penna, MD

## 2022-08-28 ENCOUNTER — Encounter: Payer: 59 | Admitting: Physical Medicine and Rehabilitation

## 2022-08-28 ENCOUNTER — Other Ambulatory Visit: Payer: Self-pay | Admitting: Family Medicine

## 2022-08-29 ENCOUNTER — Encounter: Payer: Self-pay | Admitting: Family Medicine

## 2022-08-29 ENCOUNTER — Other Ambulatory Visit: Payer: Self-pay

## 2022-08-29 MED ORDER — BACLOFEN 10 MG PO TABS: 10.0000 mg | ORAL_TABLET | Freq: Every day | ORAL | 0 refills | Status: DC

## 2022-08-29 MED ORDER — OMEPRAZOLE 20 MG PO CPDR: DELAYED_RELEASE_CAPSULE | ORAL | 1 refills | Status: DC

## 2022-08-29 MED ORDER — BACLOFEN 10 MG PO TABS: 20.0000 mg | ORAL_TABLET | Freq: Two times a day (BID) | ORAL | 0 refills | Status: DC

## 2022-08-29 MED ORDER — OMEPRAZOLE 20 MG PO CPDR: DELAYED_RELEASE_CAPSULE | ORAL | 0 refills | Status: DC

## 2022-08-29 MED ORDER — HYDRALAZINE HCL 100 MG PO TABS: 100.0000 mg | ORAL_TABLET | Freq: Three times a day (TID) | ORAL | 0 refills | Status: DC

## 2022-08-31 ENCOUNTER — Encounter: Payer: Self-pay | Admitting: Radiology

## 2022-08-31 ENCOUNTER — Ambulatory Visit: Payer: 59 | Admitting: Physical Medicine and Rehabilitation

## 2022-08-31 ENCOUNTER — Ambulatory Visit: Payer: Self-pay

## 2022-08-31 VITALS — BP 125/83 | HR 78

## 2022-08-31 DIAGNOSIS — M5416 Radiculopathy, lumbar region: Secondary | ICD-10-CM

## 2022-08-31 MED ORDER — METHYLPREDNISOLONE ACETATE 80 MG/ML IJ SUSP
40.0000 mg | Freq: Once | INTRAMUSCULAR | Status: AC
  Administered 2022-08-31: 40 mg

## 2022-08-31 NOTE — Progress Notes (Signed)
Numeric Pain Rating Scale and Functional Assessment Average Pain 0   In the last MONTH (on 0-10 scale) has pain interfered with the following?  1. General activity like being  able to carry out your everyday physical activities such as walking, climbing stairs, carrying groceries, or moving a chair?  Rating(10)   +Driver, -BT, -Dye Allergies.  Walking makes pain worse. Sitting for too long makes right leg feel numb

## 2022-08-31 NOTE — Patient Instructions (Signed)

## 2022-09-13 ENCOUNTER — Ambulatory Visit: Payer: 59 | Admitting: Dietician

## 2022-09-13 NOTE — Procedures (Signed)
Lumbosacral Transforaminal Epidural Steroid Injection - Sub-Pedicular Approach with Fluoroscopic Guidance  Patient: Andrew Sharp      Date of Birth: 1971/04/15 MRN: [id number] PCP: Laurey Morale, MD      Visit Date: 08/31/2022   Universal Protocol:    Date/Time: 08/31/2022  Consent Given By: the patient  Position: PRONE  Additional Comments: Vital signs were monitored before and after the procedure. Patient was prepped and draped in the usual sterile fashion. The correct patient, procedure, and site was verified.   Injection Procedure Details:   Procedure diagnoses: Lumbar radiculopathy [M54.16]    Meds Administered:  Meds ordered this encounter  Medications   methylPREDNISolone acetate (DEPO-MEDROL) injection 40 mg    Laterality: Right  Location/Site: L4  Needle:5.0 in., 22 ga.  Short bevel or Quincke spinal needle  Needle Placement: Transforaminal  Findings:    -Comments: Excellent flow of contrast along the nerve, nerve root and into the epidural space.  Procedure Details: After squaring off the end-plates to get a true AP view, the C-arm was positioned so that an oblique view of the foramen as noted above was visualized. The target area is just inferior to the "nose of the scotty dog" or sub pedicular. The soft tissues overlying this structure were infiltrated with 2-3 ml. of 1% Lidocaine without Epinephrine.  The spinal needle was inserted toward the target using a "trajectory" view along the fluoroscope beam.  Under AP and lateral visualization, the needle was advanced so it did not puncture dura and was located close the 6 O'Clock position of the pedical in AP tracterory. Biplanar projections were used to confirm position. Aspiration was confirmed to be negative for CSF and/or blood. A 1-2 ml. volume of Isovue-250 was injected and flow of contrast was noted at each level. Radiographs were obtained for documentation purposes.   After attaining the desired  flow of contrast documented above, a 0.5 to 1.0 ml test dose of 0.25% Marcaine was injected into each respective transforaminal space.  The patient was observed for 90 seconds post injection.  After no sensory deficits were reported, and normal lower extremity motor function was noted,   the above injectate was administered so that equal amounts of the injectate were placed at each foramen (level) into the transforaminal epidural space.   Additional Comments:  The patient tolerated the procedure well Dressing: 2 x 2 sterile gauze and Band-Aid    Post-procedure details: Patient was observed during the procedure. Post-procedure instructions were reviewed.  Patient left the clinic in stable condition.

## 2022-09-13 NOTE — Progress Notes (Signed)
Andrew Sharp - 51 y.o. male MRN [id number]  Date of birth: 01/14/1971  Office Visit Note: Visit Date: 08/31/2022 PCP: Laurey Morale, MD Referred by: Laurey Morale, MD  Subjective: Chief Complaint  Patient presents with   Lower Back - Pain   HPI:  Andrew Sharp is a 51 y.o. male who comes in today at the request of Barnet Pall, FNP for planned Right L4-5 Lumbar Transforaminal epidural steroid injection with fluoroscopic guidance.  The patient has failed conservative care including home exercise, medications, time and activity modification.  This injection will be diagnostic and hopefully therapeutic.  Please see requesting physician notes for further details and justification.   ROS Otherwise per HPI.  Assessment & Plan: Visit Diagnoses:    ICD-10-CM   1. Lumbar radiculopathy  M54.16 XR C-ARM NO REPORT    Epidural Steroid injection    methylPREDNISolone acetate (DEPO-MEDROL) injection 40 mg      Plan: No additional findings.   Meds & Orders:  Meds ordered this encounter  Medications   methylPREDNISolone acetate (DEPO-MEDROL) injection 40 mg    Orders Placed This Encounter  Procedures   XR C-ARM NO REPORT   Epidural Steroid injection    Follow-up: Return for visit to requesting provider as needed.   Procedures: No procedures performed  Lumbosacral Transforaminal Epidural Steroid Injection - Sub-Pedicular Approach with Fluoroscopic Guidance  Patient: Andrew Sharp      Date of Birth: 1971/02/26 MRN: [id number] PCP: Laurey Morale, MD      Visit Date: 08/31/2022   Universal Protocol:    Date/Time: 08/31/2022  Consent Given By: the patient  Position: PRONE  Additional Comments: Vital signs were monitored before and after the procedure. Patient was prepped and draped in the usual sterile fashion. The correct patient, procedure, and site was verified.   Injection Procedure Details:   Procedure diagnoses: Lumbar radiculopathy [M54.16]     Meds Administered:  Meds ordered this encounter  Medications   methylPREDNISolone acetate (DEPO-MEDROL) injection 40 mg    Laterality: Right  Location/Site: L4  Needle:5.0 in., 22 ga.  Short bevel or Quincke spinal needle  Needle Placement: Transforaminal  Findings:    -Comments: Excellent flow of contrast along the nerve, nerve root and into the epidural space.  Procedure Details: After squaring off the end-plates to get a true AP view, the C-arm was positioned so that an oblique view of the foramen as noted above was visualized. The target area is just inferior to the "nose of the scotty dog" or sub pedicular. The soft tissues overlying this structure were infiltrated with 2-3 ml. of 1% Lidocaine without Epinephrine.  The spinal needle was inserted toward the target using a "trajectory" view along the fluoroscope beam.  Under AP and lateral visualization, the needle was advanced so it did not puncture dura and was located close the 6 O'Clock position of the pedical in AP tracterory. Biplanar projections were used to confirm position. Aspiration was confirmed to be negative for CSF and/or blood. A 1-2 ml. volume of Isovue-250 was injected and flow of contrast was noted at each level. Radiographs were obtained for documentation purposes.   After attaining the desired flow of contrast documented above, a 0.5 to 1.0 ml test dose of 0.25% Marcaine was injected into each respective transforaminal space.  The patient was observed for 90 seconds post injection.  After no sensory deficits were reported, and normal lower extremity motor function was noted,   the above injectate  was administered so that equal amounts of the injectate were placed at each foramen (level) into the transforaminal epidural space.   Additional Comments:  The patient tolerated the procedure well Dressing: 2 x 2 sterile gauze and Band-Aid    Post-procedure details: Patient was observed during the  procedure. Post-procedure instructions were reviewed.  Patient left the clinic in stable condition.    Clinical History: MRI lumbar spine:   INDICATION: Low back pain   TECHNIQUE: Sagittal and axial T1 and T2-weighted sequences were performed. Additional sagittal STIR images were performed.   COMPARISON: Lumbar spine x-rays 05/04/2022   FINDINGS:  #  Osseous structures: No compression fracture.  #  Alignment: Normal.  #  Modic changes: Absent  #  Conus medullaris: Normal. Terminates at L1-L2 with no evidence of tethering.  #  Lower thoracic segments: No significant abnormality.   #  L1-2: Unremarkable   #  L2-3: Unremarkable   #  L3-4: Unremarkable   #  L4-5: Moderate facet joint arthritis with some thickening of the ligament of flavum. Moderate spinal stenosis.   #  L5-S1: Moderate facet joint arthritis. No spinal or foraminal stenosis.    IMPRESSION:  Moderate spinal stenosis L4-5.   Electronically Signed by: Ross Marcus, MD on 05/15/2022 11:43 AM     Objective:  VS:  HT:    WT:   BMI:     BP:125/83  HR:78bpm  TEMP: ( )  RESP:  Physical Exam Vitals and nursing note reviewed.  Constitutional:      General: He is not in acute distress.    Appearance: Normal appearance. He is not ill-appearing.  HENT:     Head: Normocephalic and atraumatic.     Right Ear: External ear normal.     Left Ear: External ear normal.     Nose: No congestion.  Eyes:     Extraocular Movements: Extraocular movements intact.  Cardiovascular:     Rate and Rhythm: Normal rate.     Pulses: Normal pulses.  Pulmonary:     Effort: Pulmonary effort is normal. No respiratory distress.  Abdominal:     General: There is no distension.     Palpations: Abdomen is soft.  Musculoskeletal:        General: No tenderness or signs of injury.     Cervical back: Neck supple.     Right lower leg: No edema.     Left lower leg: No edema.     Comments: Patient has good distal strength without  clonus.  Skin:    Findings: No erythema or rash.  Neurological:     General: No focal deficit present.     Mental Status: He is alert and oriented to person, place, and time.     Sensory: No sensory deficit.     Motor: No weakness or abnormal muscle tone.     Coordination: Coordination normal.  Psychiatric:        Mood and Affect: Mood normal.        Behavior: Behavior normal.      Imaging: No results found.

## 2022-09-14 ENCOUNTER — Other Ambulatory Visit: Payer: Self-pay | Admitting: Family Medicine

## 2022-09-14 DIAGNOSIS — I10 Essential (primary) hypertension: Secondary | ICD-10-CM

## 2022-09-21 ENCOUNTER — Ambulatory Visit: Payer: 59 | Admitting: Physical Medicine and Rehabilitation

## 2022-09-21 ENCOUNTER — Encounter: Payer: Self-pay | Admitting: Physical Medicine and Rehabilitation

## 2022-09-21 DIAGNOSIS — M5416 Radiculopathy, lumbar region: Secondary | ICD-10-CM

## 2022-09-21 DIAGNOSIS — M4726 Other spondylosis with radiculopathy, lumbar region: Secondary | ICD-10-CM

## 2022-09-21 DIAGNOSIS — M48062 Spinal stenosis, lumbar region with neurogenic claudication: Secondary | ICD-10-CM | POA: Diagnosis not present

## 2022-09-21 DIAGNOSIS — M47816 Spondylosis without myelopathy or radiculopathy, lumbar region: Secondary | ICD-10-CM | POA: Diagnosis not present
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
## 2022-09-21 NOTE — Progress Notes (Signed)
Numeric Pain Rating Scale and Functional Assessment Average Pain 4   In the last MONTH (on 0-10 scale) has pain interfered with the following?  1. General activity like being  able to carry out your everyday physical activities such as walking, climbing stairs, carrying groceries, or moving a chair?  Rating(6)   Pain is better, but still in same spot

## 2022-09-30 ENCOUNTER — Other Ambulatory Visit: Payer: Self-pay | Admitting: Family Medicine

## 2022-10-31 ENCOUNTER — Ambulatory Visit: Payer: 59 | Admitting: Pulmonary Disease

## 2022-11-01 ENCOUNTER — Encounter: Payer: Self-pay | Admitting: Physical Medicine and Rehabilitation

## 2022-11-01 ENCOUNTER — Other Ambulatory Visit: Payer: Self-pay | Admitting: Physical Medicine and Rehabilitation

## 2022-11-01 ENCOUNTER — Ambulatory Visit: Payer: 59 | Admitting: Pulmonary Disease

## 2022-11-01 ENCOUNTER — Encounter: Payer: Self-pay | Admitting: Pulmonary Disease

## 2022-11-01 VITALS — BP 118/80 | HR 77 | Ht 69.0 in | Wt 254.5 lb

## 2022-11-01 DIAGNOSIS — M48062 Spinal stenosis, lumbar region with neurogenic claudication: Secondary | ICD-10-CM

## 2022-11-01 DIAGNOSIS — M5416 Radiculopathy, lumbar region: Secondary | ICD-10-CM

## 2022-11-01 DIAGNOSIS — G4719 Other hypersomnia: Secondary | ICD-10-CM | POA: Diagnosis not present

## 2022-11-01 DIAGNOSIS — G4733 Obstructive sleep apnea (adult) (pediatric): Secondary | ICD-10-CM | POA: Diagnosis not present

## 2022-11-01 MED ORDER — MODAFINIL 200 MG PO TABS: 200.0000 mg | ORAL_TABLET | Freq: Every day | ORAL | 2 refills | Status: DC

## 2022-11-01 NOTE — Progress Notes (Signed)
Andrew Sharp    [id number]    1971/04/18  Primary Care Physician:Fry, Ishmael Holter, MD  Referring Physician: Laurey Morale, MD [place],  [place] [address]  Chief complaint:   History of snoring, witnessed apneas Diagnosed with sleep apnea and has been using CPAP  HPI: Continues to use CPAP nightly  Still has significant daytime sleepiness  Has been on Provigil 100 which he uses in the morning, this is not helping as much Still has significant daytime sleepiness despite compliance with CPAP therapy  Denies any difficulty with tolerating CPAP  Tries to stay active  Never smoker  He does have hypertension that is well controlled  Memory is fair   Outpatient Encounter Medications as of 11/01/2022  Medication Sig   amLODipine (NORVASC) 10 MG tablet TAKE 1 TABLET BY MOUTH EVERY DAY   baclofen (LIORESAL) 10 MG tablet Take 1 tablet (10 mg total) by mouth at bedtime.   buPROPion (WELLBUTRIN XL) 150 MG 24 hr tablet TAKE 1 TABLET BY MOUTH DAILY   cloNIDine (CATAPRES) 0.1 MG tablet TAKE 1 TABLET(0.1 MG) BY MOUTH TWICE DAILY   hydrALAZINE (APRESOLINE) 100 MG tablet Take 1 tablet (100 mg total) by mouth 3 (three) times daily.   losartan (COZAAR) 100 MG tablet TAKE 1 TABLET(100 MG) BY MOUTH DAILY   metoprolol succinate (TOPROL-XL) 100 MG 24 hr tablet TAKE 1 TABLET BY MOUTH EVERY DAY WITH OR IMMEDIATELY FOLLOWING A MEAL   modafinil (PROVIGIL) 100 MG tablet Take 1 tablet (100 mg total) by mouth daily.   omeprazole (PRILOSEC) 20 MG capsule TAKE 1 CAPSULE(20 MG) BY MOUTH TWICE DAILY BEFORE MEALS   phentermine 37.5 MG capsule Take 1 capsule (37.5 mg total) by mouth every morning.   spironolactone (ALDACTONE) 50 MG tablet TAKE 1 TABLET(50 MG) BY MOUTH DAILY   potassium chloride SA (KLOR-CON) 20 MEQ tablet TAKE 1 TABLET(20 MEQ) BY MOUTH THREE TIMES DAILY   [DISCONTINUED] ondansetron (ZOFRAN-ODT) 4 MG disintegrating tablet Dissolve one tablet on the tongue  every 6 hours as needed for nausea and vomiting.   No facility-administered encounter medications on file as of 11/01/2022.    Allergies as of 11/01/2022   (No Known Allergies)    Past Medical History:  Diagnosis Date   Esophageal stricture    GERD (gastroesophageal reflux disease)    History of kidney stones    Hypertension     Past Surgical History:  Procedure Laterality Date   COLONOSCOPY  03-16-06   per Dr. Deatra Ina, nonspecific colitis and int. hemorrhoids   CT CORONARY CA SCORING  09-19-11   for chest pains, negative    KNEE ARTHROPLASTY Left     Family History  Problem Relation Age of Onset   Colon cancer Maternal Aunt    Pancreatic cancer Maternal Grandmother    Crohn's disease Other        niece   Diabetes Brother        MOTHER,AUNT   Heart disease Mother    Kidney disease Mother    Prostate cancer Maternal Grandfather    Esophageal cancer Neg Hx    Rectal cancer Neg Hx    Stomach cancer Neg Hx     Social History   Socioeconomic History   Marital status: Married    Spouse name: Not on file   Number of children: 1   Years of education: Not on file   Highest education level: Associate degree: occupational, Hotel manager, or  vocational program  Occupational History   Occupation: Best boy: [employer]   Tobacco Use   Smoking status: Never   Smokeless tobacco: Never  Vaping Use   Vaping Use: Never used  Substance and Sexual Activity   Alcohol use: No    Alcohol/week: 0.0 standard drinks of alcohol   Drug use: No   Sexual activity: Not on file  Other Topics Concern   Not on file  Social History Narrative   Not on file   Social Determinants of Health   Financial Resource Strain: Low Risk  (08/18/2022)   Overall Financial Resource Strain (CARDIA)    Difficulty of Paying Living Expenses: Not very hard  Food Insecurity: Unknown (08/18/2022)   Hunger Vital Sign    Worried About Running Out of Food in the Last Year: Never true     [place] in the Last Year: Patient refused  Transportation Needs: No Transportation Needs (08/18/2022)   PRAPARE - Hydrologist (Medical): No    Lack of Transportation (Non-Medical): No  Physical Activity: Unknown (08/18/2022)   Exercise Vital Sign    Days of Exercise per Week: 0 days    Minutes of Exercise per Session: Not on file  Stress: Stress Concern Present (08/18/2022)   [place]    Feeling of Stress : To some extent  Social Connections: Unknown (08/18/2022)   Social Connection and Isolation Panel [NHANES]    Frequency of Communication with Friends and Family: Never    Frequency of Social Gatherings with Friends and Family: Never    Attends Religious Services: Patient refused    Marine scientist or Organizations: Patient refused    Attends Music therapist: Not on file    Marital Status: Married  Human resources officer Violence: Not on file    Review of Systems  Constitutional:  Positive for fatigue.  Allergic/Immunologic: Positive for food allergies.  Psychiatric/Behavioral:  Positive for sleep disturbance.     Vitals:   11/01/22 1314  BP: 118/80  Pulse: 77  SpO2: 97%     Physical Exam Constitutional:      Appearance: He is obese.  HENT:     Head: Normocephalic.     Mouth/Throat:     Comments: Mallampati 3, crowded oropharynx Eyes:     General: No scleral icterus.    Pupils: Pupils are equal, round, and reactive to light.  Cardiovascular:     Rate and Rhythm: Normal rate and regular rhythm.     Heart sounds: No murmur heard.    No friction rub.  Pulmonary:     Effort: No respiratory distress.     Breath sounds: No stridor. No wheezing or rhonchi.  Musculoskeletal:     Cervical back: No rigidity or tenderness.  Neurological:     Mental Status: He is alert.  Psychiatric:        Mood and Affect: Mood normal.    Sick compliance  97% Average use of 7 hours 8 minutes Machine set 5-15 Residual AHI 1.1  Assessment:   Mild obstructive sleep apnea -Adequately treated with CPAP therapy  Excessive daytime sleepiness despite adequate control of sleep apnea -Started on Provigil  Sleep quality is better  Pathophysiology of sleep disordered breathing discussed with the patient Treatment options discussed with the patient  Plan/Recommendations:  Encouraged to continue using CPAP nightly  Provigil prescription for 200 mg sent into pharmacy  Follow-up in 6 months  Encouraged to call with significant concerns   Sherrilyn Rist MD [facility] 11/01/2022, 1:29 PM  CC: Laurey Morale, MD

## 2022-11-01 NOTE — Patient Instructions (Signed)
Prescription for Provigil at 200 sent to pharmacy for you  Continue using your CPAP on a nightly basis -Your CPAP seems to be working well  If you notice the 200 mg of Provigil helps but you are getting sleepy in the middle of the day, a prescription for an extra 100 mg can be added as needed  I will see you in about 6 months  Call us with significant concerns

## 2022-11-08 ENCOUNTER — Ambulatory Visit: Payer: 59 | Admitting: Physician Assistant

## 2022-11-13 ENCOUNTER — Encounter: Payer: Self-pay | Admitting: Physical Medicine and Rehabilitation

## 2022-11-13 ENCOUNTER — Other Ambulatory Visit: Payer: Self-pay | Admitting: Family Medicine

## 2022-11-13 ENCOUNTER — Ambulatory Visit: Payer: 59 | Admitting: Physical Medicine and Rehabilitation

## 2022-11-13 ENCOUNTER — Ambulatory Visit: Payer: Self-pay

## 2022-11-13 VITALS — BP 124/82 | HR 101 | Ht 69.0 in | Wt 253.0 lb

## 2022-11-13 DIAGNOSIS — M5416 Radiculopathy, lumbar region: Secondary | ICD-10-CM | POA: Diagnosis not present

## 2022-11-13 MED ORDER — METHYLPREDNISOLONE ACETATE 80 MG/ML IJ SUSP
80.0000 mg | Freq: Once | INTRAMUSCULAR | Status: AC
  Administered 2022-11-13: 80 mg

## 2022-11-13 NOTE — Progress Notes (Signed)
Functional Pain Scale - descriptive words and definitions  Distracting (5)    Aware of pain/able to complete some ADL's but limited by pain/sleep is affected and active distractions are only slightly useful. Moderate range order  Average Pain 5   +Driver, -BT, -Dye Allergies.   Patient has had previous injections. Last injection helped x a couple of weeks. He noticed about 50% improvement. He is having pain right low back that radiates down right leg. When sitting, pain radiates to right knee. When laying down, pain down entire right leg and foot goes numb. He takes aleve as needed for pain with a little relief.

## 2022-11-13 NOTE — Patient Instructions (Signed)

## 2022-11-19 ENCOUNTER — Other Ambulatory Visit: Payer: Self-pay | Admitting: Nurse Practitioner

## 2022-11-20 NOTE — Telephone Encounter (Signed)
Please advise 

## 2022-11-21 NOTE — Progress Notes (Signed)
Andrew Sharp - 52 y.o. male MRN [id number]  Date of birth: 1971/02/17  Office Visit Note: Visit Date: 11/13/2022 PCP: Laurey Morale, MD Referred by: Laurey Morale, MD  Subjective: Chief Complaint  Patient presents with   Lower Back - Pain   HPI:  Andrew Sharp is a 52 y.o. male who comes in today at the request of Barnet Pall, FNP for planned Right L4-5 Lumbar Transforaminal epidural steroid injection with fluoroscopic guidance.  The patient has failed conservative care including home exercise, medications, time and activity modification.  This injection will be diagnostic and hopefully therapeutic.  Please see requesting physician notes for further details and justification.   ROS Otherwise per HPI.  Assessment & Plan: Visit Diagnoses:    ICD-10-CM   1. Lumbar radiculopathy  M54.16 XR C-ARM NO REPORT    Epidural Steroid injection    methylPREDNISolone acetate (DEPO-MEDROL) injection 80 mg      Plan: No additional findings.   Meds & Orders:  Meds ordered this encounter  Medications   methylPREDNISolone acetate (DEPO-MEDROL) injection 80 mg    Orders Placed This Encounter  Procedures   XR C-ARM NO REPORT   Epidural Steroid injection    Follow-up: Return for visit to requesting provider as needed.   Procedures: No procedures performed  Lumbosacral Transforaminal Epidural Steroid Injection - Sub-Pedicular Approach with Fluoroscopic Guidance  Patient: Andrew Sharp      Date of Birth: [birth year] MRN: [id number] PCP: Laurey Morale, MD      Visit Date: 11/13/2022   Universal Protocol:    Date/Time: 11/13/2022  Consent Given By: the patient  Position: PRONE  Additional Comments: Vital signs were monitored before and after the procedure. Patient was prepped and draped in the usual sterile fashion. The correct patient, procedure, and site was verified.   Injection Procedure Details:   Procedure diagnoses: Lumbar radiculopathy [M54.16]     Meds Administered:  Meds ordered this encounter  Medications   methylPREDNISolone acetate (DEPO-MEDROL) injection 80 mg    Laterality: Right  Location/Site: L4  Needle:5.0 in., 22 ga.  Short bevel or Quincke spinal needle  Needle Placement: Transforaminal  Findings:    -Comments: Excellent flow of contrast along the nerve, nerve root and into the epidural space.  Procedure Details: After squaring off the end-plates to get a true AP view, the C-arm was positioned so that an oblique view of the foramen as noted above was visualized. The target area is just inferior to the "nose of the scotty dog" or sub pedicular. The soft tissues overlying this structure were infiltrated with 2-3 ml. of 1% Lidocaine without Epinephrine.  The spinal needle was inserted toward the target using a "trajectory" view along the fluoroscope beam.  Under AP and lateral visualization, the needle was advanced so it did not puncture dura and was located close the 6 O'Clock position of the pedical in AP tracterory. Biplanar projections were used to confirm position. Aspiration was confirmed to be negative for CSF and/or blood. A 1-2 ml. volume of Isovue-250 was injected and flow of contrast was noted at each level. Radiographs were obtained for documentation purposes.   After attaining the desired flow of contrast documented above, a 0.5 to 1.0 ml test dose of 0.25% Marcaine was injected into each respective transforaminal space.  The patient was observed for 90 seconds post injection.  After no sensory deficits were reported, and normal lower extremity motor function was noted,   the above injectate  was administered so that equal amounts of the injectate were placed at each foramen (level) into the transforaminal epidural space.   Additional Comments:  No complications occurred Dressing: 2 x 2 sterile gauze and Band-Aid    Post-procedure details: Patient was observed during the procedure. Post-procedure  instructions were reviewed.  Patient left the clinic in stable condition.    Clinical History: MRI lumbar spine:   INDICATION: Low back pain   TECHNIQUE: Sagittal and axial T1 and T2-weighted sequences were performed. Additional sagittal STIR images were performed.   COMPARISON: Lumbar spine x-rays 05/04/2022   FINDINGS:  #  Osseous structures: No compression fracture.  #  Alignment: Normal.  #  Modic changes: Absent  #  Conus medullaris: Normal. Terminates at L1-L2 with no evidence of tethering.  #  Lower thoracic segments: No significant abnormality.   #  L1-2: Unremarkable   #  L2-3: Unremarkable   #  L3-4: Unremarkable   #  L4-5: Moderate facet joint arthritis with some thickening of the ligament of flavum. Moderate spinal stenosis.   #  L5-S1: Moderate facet joint arthritis. No spinal or foraminal stenosis.    IMPRESSION:  Moderate spinal stenosis L4-5.   Electronically Signed by: Ross Marcus, MD on 05/15/2022 11:43 AM     Objective:  VS:  HT:'5\' 9"'$  (175.3 cm)   WT:253 lb (114.8 kg)  BMI:37.34    BP:124/82  HR:(!) 101bpm  TEMP: ( )  RESP:  Physical Exam Vitals and nursing note reviewed.  Constitutional:      General: He is not in acute distress.    Appearance: Normal appearance. He is not ill-appearing.  HENT:     Head: Normocephalic and atraumatic.     Right Ear: External ear normal.     Left Ear: External ear normal.     Nose: No congestion.  Eyes:     Extraocular Movements: Extraocular movements intact.  Cardiovascular:     Rate and Rhythm: Normal rate.     Pulses: Normal pulses.  Pulmonary:     Effort: Pulmonary effort is normal. No respiratory distress.  Abdominal:     General: There is no distension.     Palpations: Abdomen is soft.  Musculoskeletal:        General: No tenderness or signs of injury.     Cervical back: Neck supple.     Right lower leg: No edema.     Left lower leg: No edema.     Comments: Patient has good distal  strength without clonus.  Skin:    Findings: No erythema or rash.  Neurological:     General: No focal deficit present.     Mental Status: He is alert and oriented to person, place, and time.     Sensory: No sensory deficit.     Motor: No weakness or abnormal muscle tone.     Coordination: Coordination normal.  Psychiatric:        Mood and Affect: Mood normal.        Behavior: Behavior normal.      Imaging: No results found.

## 2022-11-21 NOTE — Procedures (Signed)
Lumbosacral Transforaminal Epidural Steroid Injection - Sub-Pedicular Approach with Fluoroscopic Guidance  Patient: Andrew Sharp      Date of Birth: Mar 17, 1971 MRN: [id number] PCP: Laurey Morale, MD      Visit Date: 11/13/2022   Universal Protocol:    Date/Time: 11/13/2022  Consent Given By: the patient  Position: PRONE  Additional Comments: Vital signs were monitored before and after the procedure. Patient was prepped and draped in the usual sterile fashion. The correct patient, procedure, and site was verified.   Injection Procedure Details:   Procedure diagnoses: Lumbar radiculopathy [M54.16]    Meds Administered:  Meds ordered this encounter  Medications   methylPREDNISolone acetate (DEPO-MEDROL) injection 80 mg    Laterality: Right  Location/Site: L4  Needle:5.0 in., 22 ga.  Short bevel or Quincke spinal needle  Needle Placement: Transforaminal  Findings:    -Comments: Excellent flow of contrast along the nerve, nerve root and into the epidural space.  Procedure Details: After squaring off the end-plates to get a true AP view, the C-arm was positioned so that an oblique view of the foramen as noted above was visualized. The target area is just inferior to the "nose of the scotty dog" or sub pedicular. The soft tissues overlying this structure were infiltrated with 2-3 ml. of 1% Lidocaine without Epinephrine.  The spinal needle was inserted toward the target using a "trajectory" view along the fluoroscope beam.  Under AP and lateral visualization, the needle was advanced so it did not puncture dura and was located close the 6 O'Clock position of the pedical in AP tracterory. Biplanar projections were used to confirm position. Aspiration was confirmed to be negative for CSF and/or blood. A 1-2 ml. volume of Isovue-250 was injected and flow of contrast was noted at each level. Radiographs were obtained for documentation purposes.   After attaining the desired  flow of contrast documented above, a 0.5 to 1.0 ml test dose of 0.25% Marcaine was injected into each respective transforaminal space.  The patient was observed for 90 seconds post injection.  After no sensory deficits were reported, and normal lower extremity motor function was noted,   the above injectate was administered so that equal amounts of the injectate were placed at each foramen (level) into the transforaminal epidural space.   Additional Comments:  No complications occurred Dressing: 2 x 2 sterile gauze and Band-Aid    Post-procedure details: Patient was observed during the procedure. Post-procedure instructions were reviewed.  Patient left the clinic in stable condition.

## 2022-12-08 ENCOUNTER — Encounter: Payer: Self-pay | Admitting: Family Medicine

## 2022-12-08 ENCOUNTER — Ambulatory Visit: Payer: 59 | Admitting: Family Medicine

## 2022-12-08 VITALS — BP 110/70 | HR 85 | Temp 98.3°F | Ht 69.0 in | Wt 248.0 lb

## 2022-12-08 DIAGNOSIS — I10 Essential (primary) hypertension: Secondary | ICD-10-CM | POA: Diagnosis not present

## 2022-12-08 DIAGNOSIS — Z23 Encounter for immunization: Secondary | ICD-10-CM | POA: Diagnosis not present

## 2022-12-08 NOTE — Progress Notes (Signed)
   Subjective:    Patient ID: Andrew Sharp, male    DOB: 22-Sharp-1972, 52 y.o.   MRN: [id number]  HPI Here to follow up on weight and BP. We started him on Phentermine last October and this has worked quite well for him. He has lost 15 lbs since then and he feels great. His BP has come down as well, and he is averaging 110/70 at home.    Review of Systems  Constitutional: Negative.   Respiratory: Negative.    Cardiovascular: Negative.        Objective:   Physical Exam Constitutional:      Appearance: Normal appearance.  Cardiovascular:     Rate and Rhythm: Normal rate and regular rhythm.     Pulses: Normal pulses.     Heart sounds: Normal heart sounds.  Pulmonary:     Effort: Pulmonary effort is normal.     Breath sounds: Normal breath sounds.  Musculoskeletal:     Right lower leg: No edema.     Left lower leg: No edema.  Neurological:     Mental Status: He is alert.           Assessment & Plan:  He has lost some weight and he feels better. His HTN is well controlled, and we agreed to stop the Clonidine. He will return in one month for a well exam with labs.  Alysia Penna, MD

## 2022-12-11 ENCOUNTER — Other Ambulatory Visit: Payer: Self-pay | Admitting: Family Medicine

## 2022-12-11 ENCOUNTER — Encounter: Payer: Self-pay | Admitting: Physical Medicine and Rehabilitation

## 2022-12-11 ENCOUNTER — Other Ambulatory Visit: Payer: Self-pay | Admitting: Nurse Practitioner

## 2022-12-11 DIAGNOSIS — I10 Essential (primary) hypertension: Secondary | ICD-10-CM

## 2022-12-12 ENCOUNTER — Other Ambulatory Visit: Payer: Self-pay | Admitting: Physical Medicine and Rehabilitation

## 2022-12-12 ENCOUNTER — Encounter: Payer: Self-pay | Admitting: Family Medicine

## 2022-12-12 DIAGNOSIS — M5416 Radiculopathy, lumbar region: Secondary | ICD-10-CM

## 2022-12-12 DIAGNOSIS — M48062 Spinal stenosis, lumbar region with neurogenic claudication: Secondary | ICD-10-CM

## 2022-12-12 NOTE — Telephone Encounter (Signed)
Please advise 

## 2022-12-13 ENCOUNTER — Other Ambulatory Visit: Payer: Self-pay | Admitting: Family Medicine

## 2022-12-14 ENCOUNTER — Ambulatory Visit: Payer: 59 | Admitting: Nurse Practitioner

## 2022-12-14 ENCOUNTER — Encounter: Payer: Self-pay | Admitting: Nurse Practitioner

## 2022-12-14 VITALS — BP 110/72 | HR 96 | Ht 69.0 in | Wt 250.0 lb

## 2022-12-14 DIAGNOSIS — Z86010 Personal history of colonic polyps: Secondary | ICD-10-CM

## 2022-12-14 DIAGNOSIS — R1310 Dysphagia, unspecified: Secondary | ICD-10-CM | POA: Diagnosis not present

## 2022-12-14 DIAGNOSIS — K219 Gastro-esophageal reflux disease without esophagitis: Secondary | ICD-10-CM | POA: Diagnosis not present

## 2022-12-14 MED ORDER — NA SULFATE-K SULFATE-MG SULF 17.5-3.13-1.6 GM/177ML PO SOLN: 1.0000 | Freq: Once | ORAL | 0 refills | Status: AC

## 2022-12-14 MED ORDER — OMEPRAZOLE 20 MG PO CPDR: 20.0000 mg | DELAYED_RELEASE_CAPSULE | Freq: Two times a day (BID) | ORAL | 3 refills | Status: DC

## 2022-12-14 MED ORDER — BACLOFEN 10 MG PO TABS: ORAL_TABLET | ORAL | 3 refills | Status: DC

## 2022-12-14 NOTE — Patient Instructions (Addendum)
DO NOT TAKE PHENTERMINE FOR 10 DAYS PRIOR TO YOUR UPPER ENDOSCOPY AND COLONOSCOPY.  You have been scheduled for an endoscopy and colonoscopy. Please follow the written instructions given to you at your visit today. Please pick up your prep supplies at the pharmacy within the next 1-3 days. If you use inhalers (even only as needed), please bring them with you on the day of your procedure.  We have sent the following medications to your pharmacy for you to pick up at your convenience: Baclofen 10 mg & Omeprazole 20 mg  Due to recent changes in healthcare laws, you may see the results of your imaging and laboratory studies on MyChart before your provider has had a chance to review them.  We understand that in some cases there may be results that are confusing or concerning to you. Not all laboratory results come back in the same time frame and the provider may be waiting for multiple results in order to interpret others.  Please give [place] 48 hours in order for your provider to thoroughly review all the results before contacting the office for clarification of your results.    Thank you for trusting me with your gastrointestinal care!   Carl Best, CRNP

## 2022-12-14 NOTE — Progress Notes (Addendum)
12/14/2022 Andrew Sharp [id number] 1971-08-23   Chief Complaint: Medication refill, difficulty swallowing, constipation  History of Present Illness: Andrew Sharp. Cover is a 51 year old male with a past medical history of kidney stones, hypertension and GERD. He is followed by Dr. Havery Moros.  He presents today with request to refill omeprazole 20 mg which she takes twice daily and baclofen 10 mg at bedtime which controls his GERD symptoms.  His heartburn is well-controlled but he has recurrence of mild dysphagia.  He describes having food that gets stuck in his upper esophagus which passes after he thinks a few sips of water which occurs a few times weekly for the past 3 weeks.  Last week, he felt nauseous and vomited clear emesis.  No associated abdominal pain and he passed a normal bowel movement earlier that morning.  He has constipation, passes a bowel movement every few days.  He takes Dulcolax 2 tabs as needed.  No rectal bleeding or black stools.  His most recent EGD was 05/2015 which was normal.  His most recent colonoscopy was 03/2016 which showed sigmoid diverticulosis, internal hemorrhoids and one 4 mm tubular adenomatous polyp was removed from the cecum.  He takes Phentermine 37.5 mg once daily for weight loss for the past 1 to 2 months.     Latest Ref Rng & Units 02/20/2022   10:29 AM 07/19/2021    3:40 PM 01/25/2021    2:46 PM  CBC  WBC 4.0 - 10.5 K/uL 8.2  9.0  13.0   Hemoglobin 13.0 - 17.0 g/dL 13.7  13.7  15.2   Hematocrit 39.0 - 52.0 % 39.7  39.6  42.8   Platelets 150.0 - 400.0 K/uL 231.0  232.0  269        Latest Ref Rng & Units 03/13/2022    3:10 PM 02/20/2022   10:29 AM 07/19/2021    3:40 PM  CMP  Glucose 70 - 99 mg/dL 83  105  90   BUN 6 - 23 mg/dL 15  15  13   $ Creatinine 0.40 - 1.50 mg/dL 1.65  1.56  1.40   Sodium 135 - 145 mEq/L 137  137  136   Potassium 3.5 - 5.1 mEq/L 4.1  4.0  4.1   Chloride 96 - 112 mEq/L 105  104  105   CO2 19 - 32 mEq/L 25  26  23    $ Calcium 8.4 - 10.5 mg/dL 9.5  9.3  9.6   Total Protein 6.0 - 8.3 g/dL  7.7  7.8   Total Bilirubin 0.2 - 1.2 mg/dL  0.4  0.4   Alkaline Phos 39 - 117 U/L  54  65   AST 0 - 37 U/L  29  17   ALT 0 - 53 U/L  31  22     Endoscopic history: EGD 05/26/15 - normal exam EGD 02/06/12 - ? GEJ mild ring, dilated to 30m Savory, he reported benefit Colonoscopy 03/18/06 - mild rectal erythema with nonspecific inflammation, hemorrhoids Colonoscopy 03/27/16 - sigmoid diverticulosis, internal hemorrhoids, 446mcecal polyp - TA - recall 03/2021  Current Outpatient Medications on File Prior to Visit  Medication Sig Dispense Refill   amLODipine (NORVASC) 10 MG tablet TAKE 1 TABLET BY MOUTH EVERY DAY 90 tablet 0   buPROPion (WELLBUTRIN XL) 150 MG 24 hr tablet TAKE 1 TABLET BY MOUTH DAILY 30 tablet 5   hydrALAZINE (APRESOLINE) 100 MG tablet TAKE 1 TABLET(100 MG) BY MOUTH THREE TIMES  DAILY 270 tablet 0   losartan (COZAAR) 100 MG tablet TAKE 1 TABLET(100 MG) BY MOUTH DAILY 90 tablet 0   metoprolol succinate (TOPROL-XL) 100 MG 24 hr tablet TAKE 1 TABLET BY MOUTH EVERY DAY WITH OR IMMEDIATELY FOLLOWING A MEAL 90 tablet 0   modafinil (PROVIGIL) 200 MG tablet Take 1 tablet (200 mg total) by mouth daily. 30 tablet 2   omeprazole (PRILOSEC) 20 MG capsule TAKE 1 CAPSULE(20 MG) BY MOUTH TWICE DAILY BEFORE MEALS 60 capsule 1   phentermine 37.5 MG capsule Take 1 capsule (37.5 mg total) by mouth every morning. 90 capsule 1   spironolactone (ALDACTONE) 50 MG tablet TAKE 1 TABLET(50 MG) BY MOUTH DAILY 90 tablet 0   No current facility-administered medications on file prior to visit.   No Known Allergies  Current Medications, Allergies, Past Medical History, Past Surgical History, Family History and Social History were reviewed in [employer] record.  Review of Systems:   Constitutional: Negative for fever, sweats, chills or weight loss.  Respiratory: Negative for shortness of breath.   Cardiovascular:  Negative for chest pain, palpitations and leg swelling.  Gastrointestinal: See HPI.  Musculoskeletal: Negative for back pain or muscle aches.  Neurological: Negative for dizziness, headaches or paresthesias.   Physical Exam: BP 110/72 (BP Location: Left Arm, Patient Position: Sitting, Cuff Size: Normal)   Pulse 96   Ht '5\' 9"'$  (1.753 m)   Wt 250 lb (113.4 kg)   BMI 36.92 kg/m  General: 52 year old male in no acute distress. Head: Normocephalic and atraumatic. Eyes: No scleral icterus. Conjunctiva pink . Ears: Normal auditory acuity. Mouth: Dentition intact. No ulcers or lesions.  Lungs: Clear throughout to auscultation. Heart: Regular rate and rhythm, no murmur. Abdomen: Soft, nontender and nondistended. No masses or hepatomegaly. Normal bowel sounds x 4 quadrants.  Rectal: Deferred. Musculoskeletal: Symmetrical with no gross deformities. Extremities: No edema. Neurological: Alert oriented x 4. No focal deficits.  Psychological: Alert and cooperative. Normal mood and affect  Assessment and Recommendations:  52 year old male with a history of GERD which is stable on Omeprazole 20 mg twice daily with recurrence of dysphagia with solid foods for the past 3 weeks. -EGD with possible esophageal dilatation benefits and risks discussed including risk with sedation, risk of bleeding, perforation and infection  -Continue Omeprazole 20 mg twice daily baclofen 10 mg p.o. nightly -GERD diet -Patient to contact office if nausea and vomiting recurs or if his dysphagia worsens  Tubular adenomatous cecal polyp per colonoscopy 03/2016 -Colonoscopy benefits and risks discussed including risk with sedation, risk of bleeding, perforation and infection   Constipation -MiraLAX nightly -Dulcolax 2 tabs every third night  -Patient instructed to hold Phentermine for 10 days prior to EGD/colonoscopy date

## 2022-12-14 NOTE — Progress Notes (Signed)
Agree with assessment and plan as outlined.  

## 2022-12-18 NOTE — Telephone Encounter (Signed)
Yes. Call in Clonidine 0.1 mg to take BID, #60 with 5 rf

## 2022-12-19 ENCOUNTER — Other Ambulatory Visit: Payer: Self-pay

## 2022-12-19 DIAGNOSIS — I10 Essential (primary) hypertension: Secondary | ICD-10-CM

## 2022-12-19 MED ORDER — CLONIDINE HCL 0.1 MG PO TABS: 0.1000 mg | ORAL_TABLET | Freq: Two times a day (BID) | ORAL | 5 refills | Status: DC

## 2022-12-28 ENCOUNTER — Ambulatory Visit (INDEPENDENT_AMBULATORY_CARE_PROVIDER_SITE_OTHER): Payer: 59

## 2022-12-28 ENCOUNTER — Encounter: Payer: Self-pay | Admitting: Orthopedic Surgery

## 2022-12-28 ENCOUNTER — Ambulatory Visit: Payer: 59 | Admitting: Orthopedic Surgery

## 2022-12-28 VITALS — BP 117/79 | HR 73 | Ht 69.0 in | Wt 250.0 lb

## 2022-12-28 DIAGNOSIS — M47816 Spondylosis without myelopathy or radiculopathy, lumbar region: Secondary | ICD-10-CM

## 2022-12-28 DIAGNOSIS — M4726 Other spondylosis with radiculopathy, lumbar region: Secondary | ICD-10-CM

## 2022-12-28 DIAGNOSIS — M48062 Spinal stenosis, lumbar region with neurogenic claudication: Secondary | ICD-10-CM

## 2022-12-28 DIAGNOSIS — M5416 Radiculopathy, lumbar region: Secondary | ICD-10-CM
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
## 2023-01-01 ENCOUNTER — Encounter: Payer: Self-pay | Admitting: Family Medicine

## 2023-01-01 ENCOUNTER — Other Ambulatory Visit: Payer: Self-pay | Admitting: Family Medicine

## 2023-01-01 DIAGNOSIS — I10 Essential (primary) hypertension: Secondary | ICD-10-CM

## 2023-01-01 MED ORDER — SPIRONOLACTONE 50 MG PO TABS: 50.0000 mg | ORAL_TABLET | Freq: Every day | ORAL | 0 refills | Status: DC

## 2023-01-02 MED ORDER — DIAZEPAM 5 MG PO TABS: 5.0000 mg | ORAL_TABLET | Freq: Three times a day (TID) | ORAL | 0 refills | Status: DC | PRN

## 2023-01-02 NOTE — Telephone Encounter (Signed)
Pt stated that he has had persistent nausea since Office visit requesting medication to assist. No vomiting:  Pt stated that he is planning on going on a cruise this weekend and requesting something prior.  Please advise .

## 2023-01-02 NOTE — Telephone Encounter (Signed)
I sent in some Valium he can use

## 2023-01-02 NOTE — Telephone Encounter (Signed)
Andrew Sharp, ok to send in RX for Ondansetron '4mg'$  ODT dissolve one tab on tongue Q 8 hrs PRN nausea. # 30, no refills. Pls have him contact me after her return from his trip to determined if he requires further evaluation if nausea persists. If he has worsening nausea or develops vomiting during his trip he should go to the local ED for further evaluation. THX.

## 2023-01-03 ENCOUNTER — Other Ambulatory Visit: Payer: Self-pay

## 2023-01-03 DIAGNOSIS — R110 Nausea: Secondary | ICD-10-CM

## 2023-01-03 MED ORDER — ONDANSETRON 4 MG PO TBDP: 4.0000 mg | ORAL_TABLET | Freq: Three times a day (TID) | ORAL | 0 refills | Status: DC | PRN

## 2023-01-03 NOTE — Telephone Encounter (Signed)
Pt made aware of Colleen Kennedy Smith NP recommendations:  Prescription sent to pharmacy: Pt made aware: Pt verbalized understanding with all questions answered.    

## 2023-01-19 ENCOUNTER — Other Ambulatory Visit: Payer: Self-pay | Admitting: Family Medicine

## 2023-01-29 ENCOUNTER — Other Ambulatory Visit: Payer: Self-pay | Admitting: Family Medicine

## 2023-01-29 DIAGNOSIS — I10 Essential (primary) hypertension: Secondary | ICD-10-CM

## 2023-01-31 MED ORDER — AMLODIPINE BESYLATE 10 MG PO TABS: 10.0000 mg | ORAL_TABLET | Freq: Every day | ORAL | 0 refills | Status: DC

## 2023-02-04 ENCOUNTER — Encounter: Payer: Self-pay | Admitting: Certified Registered Nurse Anesthetist

## 2023-02-05 ENCOUNTER — Encounter: Payer: Self-pay | Admitting: Gastroenterology

## 2023-02-05 ENCOUNTER — Ambulatory Visit (AMBULATORY_SURGERY_CENTER): Payer: 59 | Admitting: Gastroenterology

## 2023-02-05 VITALS — BP 110/72 | HR 76 | Temp 98.4°F | Resp 15 | Ht 69.0 in | Wt 250.0 lb

## 2023-02-05 DIAGNOSIS — Z09 Encounter for follow-up examination after completed treatment for conditions other than malignant neoplasm: Secondary | ICD-10-CM

## 2023-02-05 DIAGNOSIS — K317 Polyp of stomach and duodenum: Secondary | ICD-10-CM | POA: Diagnosis not present

## 2023-02-05 DIAGNOSIS — Z86010 Personal history of colonic polyps: Secondary | ICD-10-CM | POA: Diagnosis not present

## 2023-02-05 DIAGNOSIS — D120 Benign neoplasm of cecum: Secondary | ICD-10-CM | POA: Diagnosis not present

## 2023-02-05 DIAGNOSIS — K2289 Other specified disease of esophagus: Secondary | ICD-10-CM | POA: Diagnosis not present

## 2023-02-05 DIAGNOSIS — R1310 Dysphagia, unspecified: Secondary | ICD-10-CM | POA: Diagnosis not present

## 2023-02-05 DIAGNOSIS — K219 Gastro-esophageal reflux disease without esophagitis: Secondary | ICD-10-CM

## 2023-02-05 HISTORY — PX: COLONOSCOPY: SHX174

## 2023-02-05 MED ORDER — SODIUM CHLORIDE 0.9 % IV SOLN: 500.0000 mL | Freq: Once | INTRAVENOUS | Status: DC

## 2023-02-05 NOTE — Progress Notes (Signed)
1355 Report given to PACU, vss

## 2023-02-05 NOTE — Progress Notes (Signed)
1310 Robinul 0.1 mg IV given due large amount of secretions upon assessment.  MD made aware, vss  

## 2023-02-05 NOTE — Patient Instructions (Signed)
Impression/Recommendations:  Hiatal hernia, Post-dilation diet, polyp, diverticulosis, and hemorrhoid handouts given to patient.  Continue present medications. Await pathology results.  Follow post dilation diet.  YOU HAD AN ENDOSCOPIC PROCEDURE TODAY AT THE [facility]:   Refer to the procedure report that was given to you for any specific questions about what was found during the examination.  If the procedure report does not answer your questions, please call your gastroenterologist to clarify.  If you requested that your care partner not be given the details of your procedure findings, then the procedure report has been included in a sealed envelope for you to review at your convenience later.  YOU SHOULD EXPECT: Some feelings of bloating in the abdomen. Passage of more gas than usual.  Walking can help get rid of the air that was put into your GI tract during the procedure and reduce the bloating. If you had a lower endoscopy (such as a colonoscopy or flexible sigmoidoscopy) you may notice spotting of blood in your stool or on the toilet paper. If you underwent a bowel prep for your procedure, you may not have a normal bowel movement for a few days.  Please Note:  You might notice some irritation and congestion in your nose or some drainage.  This is from the oxygen used during your procedure.  There is no need for concern and it should clear up in a day or so.  SYMPTOMS TO REPORT IMMEDIATELY:  Following lower endoscopy (colonoscopy or flexible sigmoidoscopy):  Excessive amounts of blood in the stool  Significant tenderness or worsening of abdominal pains  Swelling of the abdomen that is new, acute  Fever of 100F or higher  Following upper endoscopy (EGD)  Vomiting of blood or coffee ground material  New chest pain or pain under the shoulder blades  Painful or persistently difficult swallowing  New shortness of breath  Fever of 100F or higher  Black, tarry-looking  stools  For urgent or emergent issues, a gastroenterologist can be reached at any hour by calling [phone]. Do not use MyChart messaging for urgent concerns.    DIET:  We do recommend a small meal at first, but then you may proceed to your regular diet.  Drink plenty of fluids but you should avoid alcoholic beverages for 24 hours.  ACTIVITY:  You should plan to take it easy for the rest of today and you should NOT DRIVE or use heavy machinery until tomorrow (because of the sedation medicines used during the test).    FOLLOW UP: Our staff will call the number listed on your records the next business day following your procedure.  We will call around 7:15- 8:00 am to check on you and address any questions or concerns that you may have regarding the information given to you following your procedure. If we do not reach you, we will leave a message.     If any biopsies were taken you will be contacted by phone or by letter within the next 1-3 weeks.  Please call us at [phone] if you have not heard about the biopsies in 3 weeks.    SIGNATURES/CONFIDENTIALITY: You and/or your care partner have signed paperwork which will be entered into your electronic medical record.  These signatures attest to the fact that that the information above on your After Visit Summary has been reviewed and is understood.  Full responsibility of the confidentiality of this discharge information lies with you and/or your care-partner.

## 2023-02-05 NOTE — Op Note (Signed)
[facility] Patient Name: Andrew Sharp Procedure Date: 02/05/2023 12:42 PM MRN: [id number] Endoscopist: Viviann Spare P. Adela Lank , MD, [id number] Age: 52 Referring MD:  Date of Birth: 07/13/1971 Gender: Male Account #: [id number] Procedure:                Upper GI endoscopy Indications:              Dysphagia, Follow-up of gastro-esophageal reflux                            disease - on omeprazole, prior EGD 2016 normal as                            was barium study, no prior dilation Medicines:                Monitored Anesthesia Care Procedure:                Pre-Anesthesia Assessment:                           - Prior to the procedure, a History and Physical                            was performed, and patient medications and                            allergies were reviewed. The patient's tolerance of                            previous anesthesia was also reviewed. The risks                            and benefits of the procedure and the sedation                            options and risks were discussed with the patient.                            All questions were answered, and informed consent                            was obtained. Prior Anticoagulants: The patient has                            taken no anticoagulant or antiplatelet agents. ASA                            Grade Assessment: II - A patient with mild systemic                            disease. After reviewing the risks and benefits,                            the patient was deemed in satisfactory condition to  undergo the procedure.                           After obtaining informed consent, the endoscope was                            passed under direct vision. Throughout the                            procedure, the patient's blood pressure, pulse, and                            oxygen saturations were monitored continuously. The                            Olympus  Scope [id number] was introduced through the                            mouth, and advanced to the second part of duodenum.                            The upper GI endoscopy was accomplished without                            difficulty. The patient tolerated the procedure                            well. Scope In: Scope Out: Findings:                 Esophagogastric landmarks were identified: the                            Z-line was found at 39 cm, the gastroesophageal                            junction was found at 39 cm and the upper extent of                            the gastric folds was found at 40 cm from the                            incisors.                           A 1 cm hiatal hernia was present.                           The exam of the esophagus was otherwise normal.                           A guidewire was placed and the scope was withdrawn.                            Empiric dilation was performed in  the entire                            esophagus with a Savary dilator with mild                            resistance at 17 mm. Relook endoscopy showed no                            mucosal wrents. Biopsies were taken with a cold                            forceps for histology to rule out EoE.                           A few sessile polyps were found in the cardia, in                            the gastric fundus and in the gastric body. All                            small with exception of one large inflammatory                            appearing polyp with ulceration in the cardia,                            roughly 10-60mm in size, pedunculated. It was not                            removed on this exam given risks for bleeding, woud                            like to discuss with patient beforehand and do at                            hospital setting given risks of bleeding.                           The exam of the stomach was otherwise normal.                            The examined duodenum was normal. Complications:            No immediate complications. Estimated blood loss:                            Minimal. Estimated Blood Loss:     Estimated blood loss was minimal. Impression:               - Esophagogastric landmarks identified.                           - 1 cm hiatal hernia.                           -  Normal esophagus otherwise - empiric dilation                            performed to 17mm and biopsies obtained to rule out                            EoE.                           - A few gastric polyps. Likely benign inflammatory,                            one large, as outlined above, not removed today.                           - Normal examined duodenum. Recommendation:           - Patient has a contact number available for                            emergencies. The signs and symptoms of potential                            delayed complications were discussed with the                            patient. Return to normal activities tomorrow.                            Written discharge instructions were provided to the                            patient.                           - Resume previous diet.                           - Continue present medications.                           - Await course post dilation.                           - Await pathology results.                           - Will discuss with patient recommendation to                            remove gastric polyp at the hospital [name], MD 02/05/2023 2:09:11 PM This report has been signed electronically.

## 2023-02-05 NOTE — Op Note (Signed)
[facility] Patient Name: Andrew Sharp Procedure Date: 02/05/2023 12:41 PM MRN: [id number] Endoscopist: Viviann Spare P. Adela Lank , MD, [id number] Age: 52 Referring MD:  Date of Birth: Apr 24, 1971 Gender: Male Account #: [id number] Procedure:                Colonoscopy Indications:              High risk colon cancer surveillance: Personal                            history of colonic polyps - one adenoma removed                            03/2016 Medicines:                Monitored Anesthesia Care Procedure:                Pre-Anesthesia Assessment:                           - Prior to the procedure, a History and Physical                            was performed, and patient medications and                            allergies were reviewed. The patient's tolerance of                            previous anesthesia was also reviewed. The risks                            and benefits of the procedure and the sedation                            options and risks were discussed with the patient.                            All questions were answered, and informed consent                            was obtained. Prior Anticoagulants: The patient has                            taken no anticoagulant or antiplatelet agents. ASA                            Grade Assessment: II - A patient with mild systemic                            disease. After reviewing the risks and benefits,                            the patient was deemed in satisfactory condition to  undergo the procedure.                           After obtaining informed consent, the colonoscope                            was passed under direct vision. Throughout the                            procedure, the patient's blood pressure, pulse, and                            oxygen saturations were monitored continuously. The                            CF [id number] #[id number] was introduced through the  anus                            and advanced to the the cecum, identified by                            appendiceal orifice and ileocecal valve. The                            colonoscopy was performed without difficulty. The                            patient tolerated the procedure well. The quality                            of the bowel preparation was adequate. The                            ileocecal valve, appendiceal orifice, and rectum                            were photographed. Scope In: 1:40:52 PM Scope Out: 1:59:00 PM Scope Withdrawal Time: 0 hours 14 minutes 53 seconds  Total Procedure Duration: 0 hours 18 minutes 8 seconds  Findings:                 The perianal and digital rectal examinations were                            normal.                           A 5 mm polyp was found in the cecum. The polyp was                            flat. The polyp was removed with a cold snare.                            Resection and retrieval were complete.  A few small-mouthed diverticula were found in the                            sigmoid colon.                           Internal hemorrhoids were found.                           The exam was otherwise without abnormality. Complications:            No immediate complications. Estimated blood loss:                            Minimal. Estimated Blood Loss:     Estimated blood loss was minimal. Impression:               - One 5 mm polyp in the cecum, removed with a cold                            snare. Resected and retrieved.                           - Diverticulosis in the sigmoid colon.                           - Internal hemorrhoids.                           - The examination was otherwise normal.                           - The GI Genius (intelligent endoscopy module),                            computer-aided polyp detection system powered by AI                            was utilized to detect  colorectal polyps through                            enhanced visualization during colonoscopy. Recommendation:           - Patient has a contact number available for                            emergencies. The signs and symptoms of potential                            delayed complications were discussed with the                            patient. Return to normal activities tomorrow.                            Written discharge instructions were provided to the  patient.                           - Resume previous diet.                           - Continue present medications.                           - Await pathology results. Viviann Spare P. Sedric Guia, MD 02/05/2023 2:02:49 PM This report has been signed electronically.

## 2023-02-05 NOTE — Progress Notes (Signed)
Called to room to assist during endoscopic procedure.  Patient ID and intended procedure confirmed with present staff. Received instructions for my participation in the procedure from the performing physician.  

## 2023-02-05 NOTE — Progress Notes (Signed)
[facility] History and Physical   Primary Care Physician:  Nelwyn Salisbury, MD   Reason for Procedure:   Dysphagia, GERD, history of colon polyps  Plan:    EGD and colonoscopy     HPI: Andrew Sharp is a 52 y.o. male  here for EGD and colonoscopy to evaluate issues above. Adenoma removed in 2017. History of GERD on omeprazole, ongoing mild dysphagia, negative EGD and barium study in 2016, no prior dilation.    . Patient denies any bowel symptoms at this time other than mild constiaption. No family history of colon cancer known. Otherwise feels well without any cardiopulmonary symptoms.   I have discussed risks / benefits of anesthesia and endoscopic procedure with [name] and they wish to proceed with the exams as outlined today.    Past Medical History:  Diagnosis Date   Esophageal stricture    GERD (gastroesophageal reflux disease)    History of kidney stones    Hypertension     Past Surgical History:  Procedure Laterality Date   COLONOSCOPY  03-16-06   per Dr. Arlyce Dice, nonspecific colitis and int. hemorrhoids   CT CORONARY CA SCORING  09-19-11   for chest pains, negative    KNEE ARTHROPLASTY Left     Prior to Admission medications   Medication Sig Start Date End Date Taking? Authorizing Provider  amLODipine (NORVASC) 10 MG tablet Take 1 tablet (10 mg total) by mouth daily. 01/31/23  Yes Nelwyn Salisbury, MD  baclofen (LIORESAL) 10 MG tablet TAKE 1 TABLET(10 MG) BY MOUTH AT BEDTIME 12/14/22  Yes Arnaldo Natal, NP  buPROPion (WELLBUTRIN XL) 150 MG 24 hr tablet TAKE 1 TABLET BY MOUTH DAILY 08/15/22  Yes Nelwyn Salisbury, MD  cloNIDine (CATAPRES) 0.1 MG tablet Take 1 tablet (0.1 mg total) by mouth 2 (two) times daily. 12/19/22  Yes Nelwyn Salisbury, MD  hydrALAZINE (APRESOLINE) 100 MG tablet TAKE 1 TABLET(100 MG) BY MOUTH THREE TIMES DAILY 12/14/22  Yes Nelwyn Salisbury, MD  losartan (COZAAR) 100 MG tablet TAKE 1 TABLET(100 MG) BY MOUTH DAILY  11/13/22  Yes Nelwyn Salisbury, MD  metoprolol succinate (TOPROL-XL) 100 MG 24 hr tablet TAKE 1 TABLET BY MOUTH EVERY DAY WITH OR IMMEDIATELY FOLLOWING A MEAL 01/22/23  Yes Nelwyn Salisbury, MD  omeprazole (PRILOSEC) 20 MG capsule TAKE 1 CAPSULE(20 MG) BY MOUTH TWICE DAILY BEFORE MEALS 12/12/22  Yes Arnaldo Natal, NP  spironolactone (ALDACTONE) 50 MG tablet Take 1 tablet (50 mg total) by mouth daily. TAKE 1 TABLET(50 MG) BY MOUTH DAILY 01/01/23  Yes Nelwyn Salisbury, MD  diazepam (VALIUM) 5 MG tablet Take 1 tablet (5 mg total) by mouth every 8 (eight) hours as needed for anxiety. 01/02/23   Nelwyn Salisbury, MD  modafinil (PROVIGIL) 200 MG tablet Take 1 tablet (200 mg total) by mouth daily. 11/01/22   Tomma Lightning, MD  ondansetron (ZOFRAN-ODT) 4 MG disintegrating tablet Take 1 tablet (4 mg total) by mouth every 8 (eight) hours as needed for nausea or vomiting. 01/03/23   Arnaldo Natal, NP  phentermine 37.5 MG capsule Take 1 capsule (37.5 mg total) by mouth every morning. 08/18/22   Nelwyn Salisbury, MD    Current Outpatient Medications  Medication Sig Dispense Refill   amLODipine (NORVASC) 10 MG tablet Take 1 tablet (10 mg total) by mouth daily. 90 tablet 0   baclofen (LIORESAL) 10 MG tablet TAKE 1 TABLET(10 MG) BY MOUTH AT BEDTIME 90  tablet 3   buPROPion (WELLBUTRIN XL) 150 MG 24 hr tablet TAKE 1 TABLET BY MOUTH DAILY 30 tablet 5   cloNIDine (CATAPRES) 0.1 MG tablet Take 1 tablet (0.1 mg total) by mouth 2 (two) times daily. 60 tablet 5   hydrALAZINE (APRESOLINE) 100 MG tablet TAKE 1 TABLET(100 MG) BY MOUTH THREE TIMES DAILY 270 tablet 0   losartan (COZAAR) 100 MG tablet TAKE 1 TABLET(100 MG) BY MOUTH DAILY 90 tablet 0   metoprolol succinate (TOPROL-XL) 100 MG 24 hr tablet TAKE 1 TABLET BY MOUTH EVERY DAY WITH OR IMMEDIATELY FOLLOWING A MEAL 90 tablet 0   omeprazole (PRILOSEC) 20 MG capsule TAKE 1 CAPSULE(20 MG) BY MOUTH TWICE DAILY BEFORE MEALS 60 capsule 1   spironolactone (ALDACTONE)  50 MG tablet Take 1 tablet (50 mg total) by mouth daily. TAKE 1 TABLET(50 MG) BY MOUTH DAILY 90 tablet 0   diazepam (VALIUM) 5 MG tablet Take 1 tablet (5 mg total) by mouth every 8 (eight) hours as needed for anxiety. 30 tablet 0   modafinil (PROVIGIL) 200 MG tablet Take 1 tablet (200 mg total) by mouth daily. 30 tablet 2   ondansetron (ZOFRAN-ODT) 4 MG disintegrating tablet Take 1 tablet (4 mg total) by mouth every 8 (eight) hours as needed for nausea or vomiting. 30 tablet 0   phentermine 37.5 MG capsule Take 1 capsule (37.5 mg total) by mouth every morning. 90 capsule 1   Current Facility-Administered Medications  Medication Dose Route Frequency Provider Last Rate Last Admin   0.9 %  sodium chloride infusion  500 mL Intravenous Once Delos Klich, Willaim Rayas, MD        Allergies as of 02/05/2023   (No Known Allergies)    Family History  Problem Relation Age of Onset   Colon cancer Maternal Aunt    Pancreatic cancer Maternal Grandmother    Crohn's disease Other        niece   Diabetes Brother        MOTHER,AUNT   Heart disease Mother    Kidney disease Mother    Prostate cancer Maternal Grandfather    Esophageal cancer Neg Hx    Rectal cancer Neg Hx    Stomach cancer Neg Hx     Social History   Socioeconomic History   Marital status: Married    Spouse name: Not on file   Number of children: 1   Years of education: Not on file   Highest education level: Associate degree: occupational, Scientist, product/process development, or vocational program  Occupational History   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[employer]   Tobacco Use   Smoking status: Never   Smokeless tobacco: Never  Vaping Use   Vaping Use: Never used  Substance and Sexual Activity   Alcohol use: No    Alcohol/week: 0.0 standard drinks of alcohol   Drug use: No   Sexual activity: Not on file  Other Topics Concern   Not on file  Social History Narrative   Not on file   Social Determinants of Health   Financial Resource  Strain: Low Risk  (08/18/2022)   Overall Financial Resource Strain (CARDIA)    Difficulty of Paying Living Expenses: Not very hard  Food Insecurity: Unknown (08/18/2022)   Hunger Vital Sign    Worried About Running Out of Food in the Last Year: Never true    Ran Out of Food in the Last Year: Patient declined  Transportation Needs: No Transportation Needs (08/18/2022)   PRAPARE -  Administrator, Civil Service (Medical): No    Lack of Transportation (Non-Medical): No  Physical Activity: Unknown (08/18/2022)   Exercise Vital Sign    Days of Exercise per Week: 0 days    Minutes of Exercise per Session: Not on file  Stress: Stress Concern Present (08/18/2022)   [employer] of Occupational Health - Occupational Stress Questionnaire    Feeling of Stress : To some extent  Social Connections: Unknown (08/18/2022)   Social Connection and Isolation Panel [NHANES]    Frequency of Communication with Friends and Family: Never    Frequency of Social Gatherings with Friends and Family: Never    Attends Religious Services: Patient declined    Database administrator or Organizations: Patient declined    Attends Engineer, structural: Not on file    Marital Status: Married  Catering manager Violence: Not on file    Review of Systems: All other review of systems negative except as mentioned in the HPI.  Physical Exam: Vital signs BP 127/74 (BP Location: Right Arm, Patient Position: Sitting, Cuff Size: Normal)   Pulse 72   Temp 98.4 F (36.9 C) (Temporal)   Ht  (1.753 m)   Wt 250 lb (113.4 kg)   SpO2 97%   BMI 36.92 kg/m   General:   Alert,  Well-developed, pleasant and cooperative in NAD Lungs:  Clear throughout to auscultation.   Heart:  Regular rate and rhythm Abdomen:  Soft, nontender and nondistended.   Neuro/Psych:  Alert and cooperative. Normal mood and affect. A and O x 3  Harlin Rain, MD [facility]

## 2023-02-06 ENCOUNTER — Telehealth: Payer: Self-pay | Admitting: *Deleted

## 2023-02-06 NOTE — Telephone Encounter (Signed)
  Follow up Call-     02/05/2023   12:54 PM 02/05/2023   12:47 PM  Call back number  Post procedure Call Back phone  # [phone]   Permission to leave phone message  Yes     Patient questions:  Do you have a fever, pain , or abdominal swelling? No. Pain Score  0 *  Have you tolerated food without any problems? Yes.    Have you been able to return to your normal activities? Yes.    Do you have any questions about your discharge instructions: Diet   No. Medications  No. Follow up visit  No.  Do you have questions or concerns about your Care? No.  Actions: * If pain score is 4 or above: No action needed, pain <4.

## 2023-02-07 ENCOUNTER — Encounter: Payer: 59 | Admitting: Gastroenterology

## 2023-02-08 ENCOUNTER — Telehealth: Payer: Self-pay

## 2023-02-08 ENCOUNTER — Other Ambulatory Visit: Payer: Self-pay

## 2023-02-08 ENCOUNTER — Encounter: Payer: Self-pay | Admitting: Gastroenterology

## 2023-02-08 DIAGNOSIS — K317 Polyp of stomach and duodenum: Secondary | ICD-10-CM

## 2023-02-08 NOTE — Progress Notes (Signed)
Patient needs EGD at [facility] for removal of Gastric polyp. Scheduled for 04-09-23

## 2023-02-08 NOTE — Telephone Encounter (Signed)
Called and spoke to patient. He is able to have his EGD at [facility] on Monday, 6-17.  He has been added for EGD at 7:30am . Case # [id number]. Scheduled PV with patient for Monday 6-3 at 8:00am in person.

## 2023-02-08 NOTE — Telephone Encounter (Signed)
Monday, 6-17

## 2023-02-11 ENCOUNTER — Other Ambulatory Visit: Payer: Self-pay | Admitting: Family Medicine

## 2023-02-15 ENCOUNTER — Encounter: Payer: Self-pay | Admitting: Neurology

## 2023-02-15 ENCOUNTER — Ambulatory Visit: Payer: 59 | Admitting: Orthopedic Surgery

## 2023-02-15 DIAGNOSIS — M5416 Radiculopathy, lumbar region: Secondary | ICD-10-CM | POA: Diagnosis not present

## 2023-02-15 MED ORDER — PREGABALIN 75 MG PO CAPS: 75.0000 mg | ORAL_CAPSULE | Freq: Two times a day (BID) | ORAL | 0 refills | Status: DC

## 2023-02-15 NOTE — Progress Notes (Signed)
Orthopedic Spine Surgery Office Note  Assessment: Patient is a 52 y.o. male with low back pain that radiates into the right buttock and posterior thigh.  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 Prescription provided to him today -Discussed L4/5 laminectomy as a treatment option if he does not get relief with lyrica -Patient should return to office in 4-5 weeks, x-rays at next visit: none   Patient expressed understanding of the plan and all questions were answered to the patient's satisfaction.   ___________________________________________________________________________  History: Patient is a 52 y.o. male who has been previously seen in the office for symptoms of low back pain that radiates into the right buttock and right posterior thigh.  He states the pain is about the same as the last time I saw him.  Some days it is a little bit worse.  He describes the pain as nagging and burning.  He notices that it is worse when he is active and gets better if he sits down.  He has decreased sensation in the right buttock and posterior thigh.  No other numbness or paresthesias.  No radiating pain down the left lower extremity.  Previous treatments: NSAIDs, Tylenol, physical therapy, steroid injections   Physical Exam:  General: no acute distress, appears stated age Neurologic: alert, answering questions appropriately, following commands Respiratory: unlabored breathing on room air, symmetric chest rise Psychiatric: appropriate affect, normal cadence to speech   MSK (spine):   -Strength exam                                                   Left                  Right EHL                              5/5                  5/5 TA                                 5/5                  5/5 GSC                             5/5                  5/5 Knee extension            5/5                   5/5 Hip flexion                    5/5                  5/5   -Sensory exam                           Sensation intact to light touch in L3-S1 nerve distributions of bilateral lower  extremities   -Achilles DTR: 2/4 on the left, 2/4 on the right -Patellar tendon DTR: 2/4 on the left, 2/4 on the right   -Straight leg raise: negative bilaterally -Femoral nerve stretch test: negative bilaterally -Clonus: no beats bilaterally   -Left hip exam: No pain through range of motion, negative Stinchfield, negative FABER -Right hip exam: No pain through remainder of range of motion, negative Stinchfield, negative FABER  Imaging: XR of the lumbar spine from 12/28/2022 was independently reviewed and interpreted, showing anterior osteophyte formation at L4/5. Joint space narrowing in the right hip. No evidence of instability on flexion/extension view. No fracture or dislocation seen.   MRI (on disc) of the lumbar spine from 05/12/2022 was independently reviewed and interpreted, showing lateral recess and central stenosis at L4/5.  No other significant stenosis.   Patient name: Andrew Sharp Patient MRN: [id number] Date of visit: 02/15/23

## 2023-02-19 ENCOUNTER — Other Ambulatory Visit: Payer: Self-pay | Admitting: Pulmonary Disease

## 2023-02-22 ENCOUNTER — Encounter: Payer: Self-pay | Admitting: Neurology

## 2023-03-01 ENCOUNTER — Encounter: Payer: Self-pay | Admitting: Family Medicine

## 2023-03-01 ENCOUNTER — Other Ambulatory Visit: Payer: Self-pay | Admitting: Family Medicine

## 2023-03-01 ENCOUNTER — Other Ambulatory Visit: Payer: Self-pay

## 2023-03-01 NOTE — Telephone Encounter (Signed)
Tried to send the Rx but not able to escribe Pt LOV was 12/08/2022.

## 2023-03-02 ENCOUNTER — Encounter: Payer: Self-pay | Admitting: Physical Medicine and Rehabilitation

## 2023-03-02 NOTE — Telephone Encounter (Signed)
Noted  

## 2023-03-02 NOTE — Telephone Encounter (Signed)
I sent this in yesterday

## 2023-03-07 ENCOUNTER — Encounter: Payer: Self-pay | Admitting: Family Medicine

## 2023-03-07 ENCOUNTER — Other Ambulatory Visit: Payer: Self-pay

## 2023-03-07 DIAGNOSIS — R202 Paresthesia of skin: Secondary | ICD-10-CM

## 2023-03-08 ENCOUNTER — Encounter: Payer: Self-pay | Admitting: Orthopedic Surgery

## 2023-03-08 ENCOUNTER — Ambulatory Visit: Payer: 59 | Admitting: Neurology

## 2023-03-08 ENCOUNTER — Encounter: Payer: Self-pay | Admitting: Family Medicine

## 2023-03-08 DIAGNOSIS — R202 Paresthesia of skin: Secondary | ICD-10-CM

## 2023-03-08 DIAGNOSIS — M5416 Radiculopathy, lumbar region: Secondary | ICD-10-CM

## 2023-03-08 NOTE — Procedures (Signed)
  [facility]  [address] [place], [address]  [place], [place] [address] Tel: [phone] Fax: [phone] Test Date:  03/08/2023  Patient: Andrew Sharp DOB: 1970-11-03 Physician: Nita Sickle, DO  Sex: Male Height: 5\' 9"  Ref Phys: Willia Craze, MD  ID#: [id number]   Technician:    History: This is a 52 year old man referred for evaluation of radicular right leg pain and low back pain.  NCV & EMG Findings: Extensive electrodiagnostic testing of the right lower extremity shows:  Right sural and superficial peroneal sensory responses are within normal limits. Right peroneal motor response at the extensor digitorum brevis is absent, and normal at the tibialis anterior.  Right tibial motor responses within normal limits. Right tibial H reflex study is within normal limits. Chronic motor axon loss changes are seen affecting the L4-5 myotome, without accompanying active denervation.  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 There is no evidence of a large fiber sensorimotor polyneuropathy affecting the lower extremities.   ___________________________ Nita Sickle, DO    Nerve Conduction Studies   Stim Site NR Peak (ms) Norm Peak (ms) O-P Amp (V) Norm O-P Amp  Right Sup Peroneal Anti Sensory (Ant Lat Mall)  32 C  12 cm    2.4 <4.6 8.5 >4  Right Sural Anti Sensory (Lat Mall)  32 C  Calf    2.6 <4.6 10.1 >4     Stim Site NR Onset (ms) Norm Onset (ms) O-P Amp (mV) Norm O-P Amp Site1 Site2 Delta-0 (ms) Dist (cm) Vel (m/s) Norm Vel (m/s)  Right Peroneal Motor (Ext Dig Brev)  32 C  Ankle *NR  <6.0  >2.5 B Fib Ankle  0.0  >40  B Fib *NR     Poplt B Fib  0.0  >40  Poplt *NR            Right Peroneal TA Motor (Tib Ant)  32 C  Fib Head    2.3 <4.5 5.0 >3 Poplit Fib Head 1.5 10.0 67 >40  Poplit    3.8 <5.7 4.8         Right Tibial Motor (Abd Hall Brev)  32 C  Ankle    3.4 <6.0 7.1 >4 Knee Ankle 10.3 43.0 42 >40  Knee     13.7  4.9          Electromyography   Side Muscle Ins.Act Fibs Fasc Recrt Amp Dur Poly Activation Comment  Right AntTibialis Nml Nml Nml *1- *1+ *1+ *1+ Nml N/A  Right Gastroc Nml Nml Nml Nml Nml Nml Nml Nml N/A  Right Flex Dig Long Nml Nml Nml *2- *1+ *1+ *1+ Nml N/A  Right RectFemoris Nml Nml Nml *1- *1+ *1+ *1+ Nml N/A  Right BicepsFemS Nml Nml Nml Nml Nml Nml Nml Nml N/A  Right GluteusMed Nml Nml Nml *2- *1+ *1+ *1+ Nml N/A  Right AdductorLong Nml Nml Nml Nml Nml Nml Nml Nml N/A      Waveforms:

## 2023-03-09 ENCOUNTER — Other Ambulatory Visit: Payer: Self-pay

## 2023-03-09 MED ORDER — PHENTERMINE HCL 37.5 MG PO CAPS: 37.5000 mg | ORAL_CAPSULE | Freq: Every day | ORAL | 1 refills | Status: DC

## 2023-03-09 NOTE — Telephone Encounter (Signed)
Pt Rx for Phentermine was e scribed but did not go though, pt is requesting for Rx to go to his pharmacy needs Dr Clent Ridges to e scribe and sign off this Rx.

## 2023-03-09 NOTE — Telephone Encounter (Signed)
Done

## 2023-03-12 ENCOUNTER — Encounter: Payer: Self-pay | Admitting: Pulmonary Disease

## 2023-03-12 ENCOUNTER — Ambulatory Visit: Payer: 59 | Admitting: Pulmonary Disease

## 2023-03-12 VITALS — BP 122/82 | HR 73 | Ht 69.0 in | Wt 257.0 lb

## 2023-03-12 DIAGNOSIS — G4719 Other hypersomnia: Secondary | ICD-10-CM

## 2023-03-12 DIAGNOSIS — G4733 Obstructive sleep apnea (adult) (pediatric): Secondary | ICD-10-CM | POA: Diagnosis not present

## 2023-03-12 MED ORDER — MODAFINIL 100 MG PO TABS: ORAL_TABLET | ORAL | 1 refills | Status: DC

## 2023-03-12 NOTE — Patient Instructions (Signed)
CPAP seems to be working well  With you having significant daytime sleepiness after about 1:00 will mean increasing the dose of the Provigil to 200 in the morning and 100 about midday  We will keep your appointment at 6 months  Call with significant concerns

## 2023-03-12 NOTE — Progress Notes (Signed)
Andrew Sharp    [id number]    12/11/1970  Primary Care Physician:Fry, Tera Mater, MD  Referring Physician: Nelwyn Salisbury, MD [address] [place],  [place] [address]  Chief complaint:   History of snoring, witnessed apneas Diagnosed with sleep apnea and has been using CPAP  HPI: Optimally treated sleep disordered breathing with persistence of excessive daytime sleepiness  200 mg of Provigil in the morning usually leads to a lot of the next day about 1:00 when he starts feeling profoundly sleepy again  The Provigil does help whenever he takes it in the morning  Waking up in the morning feeling like he is at a good nights rest  He recently had a trip when he did not have his CPAP with him and did struggle quite a bit with very poor sleep quality and tiredness  He is not having any significant difficulty with his CPAP at present  Never smoker  He does have hypertension that is well controlled  Memory is fair   Outpatient Encounter Medications as of 03/12/2023  Medication Sig   amLODipine (NORVASC) 10 MG tablet Take 1 tablet (10 mg total) by mouth daily.   baclofen (LIORESAL) 10 MG tablet TAKE 1 TABLET(10 MG) BY MOUTH AT BEDTIME   buPROPion (WELLBUTRIN XL) 150 MG 24 hr tablet TAKE 1 TABLET BY MOUTH DAILY   cloNIDine (CATAPRES) 0.1 MG tablet Take 1 tablet (0.1 mg total) by mouth 2 (two) times daily.   diazepam (VALIUM) 5 MG tablet Take 1 tablet (5 mg total) by mouth every 8 (eight) hours as needed for anxiety.   hydrALAZINE (APRESOLINE) 100 MG tablet TAKE 1 TABLET(100 MG) BY MOUTH THREE TIMES DAILY   losartan (COZAAR) 100 MG tablet TAKE 1 TABLET(100 MG) BY MOUTH DAILY   metoprolol succinate (TOPROL-XL) 100 MG 24 hr tablet TAKE 1 TABLET BY MOUTH EVERY DAY WITH OR IMMEDIATELY FOLLOWING A MEAL   modafinil (PROVIGIL) 200 MG tablet TAKE 1 TABLET(200 MG) BY MOUTH DAILY   omeprazole (PRILOSEC) 20 MG capsule TAKE 1 CAPSULE(20 MG) BY MOUTH TWICE DAILY BEFORE  MEALS   ondansetron (ZOFRAN-ODT) 4 MG disintegrating tablet Take 1 tablet (4 mg total) by mouth every 8 (eight) hours as needed for nausea or vomiting.   phentermine 37.5 MG capsule Take 1 capsule (37.5 mg total) by mouth daily.   pregabalin (LYRICA) 75 MG capsule Take 1 capsule (75 mg total) by mouth 2 (two) times daily.   spironolactone (ALDACTONE) 50 MG tablet Take 1 tablet (50 mg total) by mouth daily. TAKE 1 TABLET(50 MG) BY MOUTH DAILY   No facility-administered encounter medications on file as of 03/12/2023.    Allergies as of 03/12/2023   (No Known Allergies)    Past Medical History:  Diagnosis Date   Esophageal stricture    GERD (gastroesophageal reflux disease)    History of kidney stones    Hypertension     Past Surgical History:  Procedure Laterality Date   COLONOSCOPY  03-16-06   per Dr. Arlyce Dice, nonspecific colitis and int. hemorrhoids   CT CORONARY CA SCORING  09-19-11   for chest pains, negative    KNEE ARTHROPLASTY Left     Family History  Problem Relation Age of Onset   Colon cancer Maternal Aunt    Pancreatic cancer Maternal Grandmother    Crohn's disease Other        niece   Diabetes Brother  MOTHER,AUNT   Heart disease Mother    Kidney disease Mother    Prostate cancer Maternal Grandfather    Esophageal cancer Neg Hx    Rectal cancer Neg Hx    Stomach cancer Neg Hx     Social History   Socioeconomic History   Marital status: Married    Spouse name: Not on file   Number of children: 1   Years of education: Not on file   Highest education level: Associate degree: occupational, Scientist, product/process development, or vocational program  Occupational History   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[employer]   Tobacco Use   Smoking status: Never   Smokeless tobacco: Never  Vaping Use   Vaping Use: Never used  Substance and Sexual Activity   Alcohol use: No    Alcohol/week: 0.0 standard drinks of alcohol   Drug use: No   Sexual activity: Not on file   Other Topics Concern   Not on file  Social History Narrative   Not on file   Social Determinants of Health   Financial Resource Strain: Low Risk  (08/18/2022)   Overall Financial Resource Strain (CARDIA)    Difficulty of Paying Living Expenses: Not very hard  Food Insecurity: Unknown (08/18/2022)   Hunger Vital Sign    Worried About Running Out of Food in the Last Year: Never true    Ran Out of Food in the Last Year: Patient declined  Transportation Needs: No Transportation Needs (08/18/2022)   PRAPARE - Administrator, Civil Service (Medical): No    Lack of Transportation (Non-Medical): No  Physical Activity: Unknown (08/18/2022)   Exercise Vital Sign    Days of Exercise per Week: 0 days    Minutes of Exercise per Session: Not on file  Stress: Stress Concern Present (08/18/2022)   [employer] of Occupational Health - Occupational Stress Questionnaire    Feeling of Stress : To some extent  Social Connections: Unknown (08/18/2022)   Social Connection and Isolation Panel [NHANES]    Frequency of Communication with Friends and Family: Never    Frequency of Social Gatherings with Friends and Family: Never    Attends Religious Services: Patient declined    Database administrator or Organizations: Patient declined    Attends Engineer, structural: Not on file    Marital Status: Married  Catering manager Violence: Not on file    Review of Systems  Constitutional:  Positive for fatigue.  Respiratory:  Positive for apnea.   Allergic/Immunologic: Positive for food allergies.  Psychiatric/Behavioral:  Positive for sleep disturbance.     Vitals:   03/12/23 1438  BP: 122/82  Pulse: 73  SpO2: 95%     Physical Exam Constitutional:      Appearance: He is obese.  HENT:     Head: Normocephalic.     Mouth/Throat:     Comments: Mallampati 3, crowded oropharynx Eyes:     General: No scleral icterus.    Pupils: Pupils are equal, round, and reactive to  light.  Cardiovascular:     Rate and Rhythm: Normal rate and regular rhythm.     Heart sounds: No murmur heard.    No friction rub.  Pulmonary:     Effort: No respiratory distress.     Breath sounds: No stridor. No wheezing or rhonchi.  Musculoskeletal:     Cervical back: No rigidity or tenderness.  Neurological:     Mental Status: He is alert.  Psychiatric:  Mood and Affect: Mood normal.    CPAP compliance reviewed showing 92% compliance Average use of 7 hours AutoSet of 5-15 residual AHI of 0.8  Assessment:   Mild obstructive sleep apnea -Optimally treated with CPAP therapy  Excessive daytime sleepiness despite optimal treatment of sleep disordered breathing -On Provigil 200 mg daily -He is still having significant sleepiness after about midday, we discussed about adding an extra dose of Provigil 100 mg  He is aware not to take Provigil too late in the day as it may alter his sleep at night  Sleep quality is overall better still struggling with daytime fatigue   Plan/Recommendations:  Continue CPAP on a nightly basis  Prescription for Provigil 200 mg in the morning and 100 mg about midday sent to pharmacy  I will see him back in about 6 months  Encouraged to call with significant concerns    Virl Diamond MD [facility] 03/12/2023, 2:56 PM  CC: Nelwyn Salisbury, MD

## 2023-03-13 ENCOUNTER — Other Ambulatory Visit: Payer: Self-pay | Admitting: Family Medicine

## 2023-03-13 DIAGNOSIS — I10 Essential (primary) hypertension: Secondary | ICD-10-CM

## 2023-03-14 ENCOUNTER — Ambulatory Visit: Payer: 59 | Admitting: Orthopedic Surgery

## 2023-03-14 ENCOUNTER — Encounter: Payer: Self-pay | Admitting: Physical Medicine and Rehabilitation

## 2023-03-14 DIAGNOSIS — M5416 Radiculopathy, lumbar region: Secondary | ICD-10-CM

## 2023-03-14 NOTE — Telephone Encounter (Signed)
Holding for [employer]

## 2023-03-14 NOTE — Progress Notes (Signed)
Orthopedic Spine Surgery Office Note   Assessment: Patient is a 52 y.o. male with low back pain that radiates into the right buttock and posterior thigh.  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  He will call the office if he needs his refills -Since his pain is currently tolerable, I recommended continued nonoperative treatment.  I told him if his pain does get to the point that is intolerable and interfering with his life, surgery would be an option for him in the form of a L4 and L5 segment laminectomies -Patient should return to office in 12 weeks, x-rays at next visit: none     Patient expressed understanding of the plan and all questions were answered to the patient's satisfaction.    ___________________________________________________________________________   History: Patient is a 52 y.o. male who has been previously seen in the office for symptoms of low back pain that radiates into the right buttock and right posterior thigh.  He has had no change in symptoms since the last time he saw him.  Pain is still felt in the low back and radiating to the right buttock and right posterior thigh.  He notices the pain on a daily basis.  Pain is currently tolerable and he is able to do his activities of daily living at this pain level.  No pain radiating to the left lower extremity.   Previous treatments: NSAIDs, Tylenol, physical therapy, steroid injections, Lyrica   Physical Exam:   General: no acute distress, appears stated age Neurologic: alert, answering questions appropriately, following commands Respiratory: unlabored breathing on room air, symmetric chest rise Psychiatric: appropriate affect, normal cadence to speech     MSK (spine):   -Strength exam                                                   Left                   Right EHL                              5/5                  5/5 TA                                 5/5                  5/5 GSC                             5/5                  5/5 Knee extension            5/5                  5/5 Hip flexion                    5/5  5/5   -Sensory exam                           Sensation intact to light touch in L3-S1 nerve distributions of bilateral lower extremities   -Achilles DTR: 2/4 on the left, 2/4 on the right -Patellar tendon DTR: 2/4 on the left, 2/4 on the right   -Straight leg raise: negative bilaterally -Femoral nerve stretch test: negative bilaterally -Clonus: no beats bilaterally   -Left hip exam: No pain through range of motion, negative Stinchfield, negative FABER -Right hip exam: No pain through remainder of range of motion, negative Stinchfield, negative FABER   Imaging: XR of the lumbar spine from 12/28/2022 was previously independently reviewed and interpreted, showing anterior osteophyte formation at L4/5. Joint space narrowing in the right hip. No evidence of instability on flexion/extension view. No fracture or dislocation seen.    MRI (on disc) of the lumbar spine from 05/12/2022 was previously independently reviewed and interpreted, showing lateral recess and central stenosis at L4/5.  No other significant stenosis.   EMG/NCS from 03/08/2023 was reviewed and shows chronic right L5 radiculopathy (moderate)   Patient name: Andrew Sharp Patient MRN: [id number] Date of visit: 03/14/23

## 2023-03-21 ENCOUNTER — Ambulatory Visit: Payer: 59 | Admitting: Physical Medicine and Rehabilitation

## 2023-03-26 ENCOUNTER — Ambulatory Visit (AMBULATORY_SURGERY_CENTER): Payer: 59 | Admitting: *Deleted

## 2023-03-26 ENCOUNTER — Other Ambulatory Visit: Payer: Self-pay | Admitting: Orthopedic Surgery

## 2023-03-26 VITALS — Ht 69.0 in | Wt 259.0 lb

## 2023-03-26 DIAGNOSIS — K317 Polyp of stomach and duodenum: Secondary | ICD-10-CM

## 2023-03-26 NOTE — Progress Notes (Signed)
Pt's name and DOB verified at the beginning of the pre-visit.  Pt denies any difficulty with ambulating,sitting, laying down or rolling side to side Gave both [facility] main # and MD on call # prior to instructions.  No egg or soy allergy known to patient  No issues known to pt with past sedation with any surgeries or procedures Pt denies having issues being intubated Pt has no issues moving head neck or swallowing No FH of Malignant Hyperthermia Pt is on diet pills. Instructed to hold for 10 days prior to procedure and day of procedure. Pt states he will. Pt is not on home 02  Pt is not on blood thinners  Pt has frequent issues with constipation RN instructed pt to use Miralax per bottles instructions a week before prep days. Pt states they will Pt is not on dialysis Pt denise any abnormal heart rhythms  Pt denies any upcoming cardiac testing Pt encouraged to use to use Singlecare or Goodrx to reduce cost  Patient's chart reviewed by Cathlyn Parsons CNRA prior to pre-visit and patient appropriate for the [facility].  Pre-visit completed and red dot placed by patient's name on their procedure day (on provider's schedule).  . Visit by phone Pt scale weight is 259 lb Instructed pt why it is important to and  to call if they have any changes in health or new medications. Directed them to the # given and on instructions.   Pt states they will.  Instructions reviewed with pt and pt states understanding. Instructed to review again prior to procedure. Pt states th Instructions handed to pt and sent to  my chart

## 2023-03-28 ENCOUNTER — Encounter: Payer: Self-pay | Admitting: Gastroenterology

## 2023-03-29 ENCOUNTER — Encounter (HOSPITAL_COMMUNITY): Payer: Self-pay | Admitting: Gastroenterology

## 2023-04-07 NOTE — Anesthesia Preprocedure Evaluation (Addendum)
Anesthesia Evaluation  Patient identified by MRN, date of birth, ID band Patient awake    Reviewed: Allergy & Precautions, H&P , NPO status , Patient's Chart, lab work & pertinent test results  Airway Mallampati: I  TM Distance: >3 FB Neck ROM: Full    Dental no notable dental hx. (+) Teeth Intact, Dental Advisory Given   Pulmonary neg pulmonary ROS, sleep apnea    Pulmonary exam normal breath sounds clear to auscultation       Cardiovascular Exercise Tolerance: Good hypertension, Pt. on medications and Pt. on home beta blockers negative cardio ROS Normal cardiovascular exam Rhythm:Regular Rate:Normal     Neuro/Psych  PSYCHIATRIC DISORDERS Anxiety Depression    negative neurological ROS  negative psych ROS   GI/Hepatic negative GI ROS, Neg liver ROS,GERD  Medicated,,  Endo/Other  negative endocrine ROS    Renal/GU negative Renal ROS  negative genitourinary   Musculoskeletal negative musculoskeletal ROS (+)    Abdominal   Peds negative pediatric ROS (+)  Hematology negative hematology ROS (+)   Anesthesia Other Findings   Reproductive/Obstetrics negative OB ROS                             Anesthesia Physical Anesthesia Plan  ASA: 3  Anesthesia Plan: MAC   Post-op Pain Management: Minimal or no pain anticipated   Induction: Intravenous  PONV Risk Score and Plan: 2 and Propofol infusion  Airway Management Planned: Mask and Natural Airway  Additional Equipment: None  Intra-op Plan:   Post-operative Plan:   Informed Consent: I have reviewed the patients History and Physical, chart, labs and discussed the procedure including the risks, benefits and alternatives for the proposed anesthesia with the patient or authorized representative who has indicated his/her understanding and acceptance.       Plan Discussed with: Anesthesiologist and CRNA  Anesthesia Plan Comments:          Anesthesia Quick Evaluation

## 2023-04-09 ENCOUNTER — Ambulatory Visit (HOSPITAL_COMMUNITY)
Admission: RE | Admit: 2023-04-09 | Discharge: 2023-04-09 | Disposition: A | Discharge status: Home / Self Care | Payer: 59 | Attending: Gastroenterology | Admitting: Gastroenterology

## 2023-04-09 ENCOUNTER — Ambulatory Visit (HOSPITAL_BASED_OUTPATIENT_CLINIC_OR_DEPARTMENT_OTHER): Payer: 59 | Admitting: Anesthesiology

## 2023-04-09 ENCOUNTER — Ambulatory Visit (HOSPITAL_COMMUNITY): Payer: 59 | Admitting: Anesthesiology

## 2023-04-09 ENCOUNTER — Other Ambulatory Visit: Payer: Self-pay

## 2023-04-09 ENCOUNTER — Encounter (HOSPITAL_COMMUNITY)
Admission: RE | Disposition: A | Discharge status: Home / Self Care | Payer: Self-pay | Source: Home / Self Care | Attending: Gastroenterology

## 2023-04-09 ENCOUNTER — Encounter (HOSPITAL_COMMUNITY): Payer: Self-pay | Admitting: Gastroenterology

## 2023-04-09 DIAGNOSIS — K219 Gastro-esophageal reflux disease without esophagitis: Secondary | ICD-10-CM | POA: Insufficient documentation

## 2023-04-09 DIAGNOSIS — K317 Polyp of stomach and duodenum: Secondary | ICD-10-CM

## 2023-04-09 DIAGNOSIS — G4730 Sleep apnea, unspecified: Secondary | ICD-10-CM | POA: Diagnosis not present

## 2023-04-09 DIAGNOSIS — I10 Essential (primary) hypertension: Secondary | ICD-10-CM

## 2023-04-09 DIAGNOSIS — Z79899 Other long term (current) drug therapy: Secondary | ICD-10-CM | POA: Insufficient documentation

## 2023-04-09 DIAGNOSIS — K2950 Unspecified chronic gastritis without bleeding: Secondary | ICD-10-CM | POA: Diagnosis not present

## 2023-04-09 DIAGNOSIS — F32A Depression, unspecified: Secondary | ICD-10-CM | POA: Insufficient documentation

## 2023-04-09 DIAGNOSIS — Z8249 Family history of ischemic heart disease and other diseases of the circulatory system: Secondary | ICD-10-CM | POA: Diagnosis not present

## 2023-04-09 DIAGNOSIS — K449 Diaphragmatic hernia without obstruction or gangrene: Secondary | ICD-10-CM | POA: Diagnosis not present

## 2023-04-09 DIAGNOSIS — F419 Anxiety disorder, unspecified: Secondary | ICD-10-CM | POA: Insufficient documentation

## 2023-04-09 HISTORY — PX: POLYPECTOMY: SHX5525

## 2023-04-09 HISTORY — PX: SCLEROTHERAPY: SHX6841

## 2023-04-09 HISTORY — PX: ESOPHAGOGASTRODUODENOSCOPY (EGD) WITH PROPOFOL: SHX5813

## 2023-04-09 HISTORY — PX: HEMOSTASIS CLIP PLACEMENT: SHX6857

## 2023-04-09 SURGERY — ESOPHAGOGASTRODUODENOSCOPY (EGD) WITH PROPOFOL
Anesthesia: Monitor Anesthesia Care

## 2023-04-09 MED ORDER — PROPOFOL 500 MG/50ML IV EMUL
INTRAVENOUS | Status: DC | PRN
  Administered 2023-04-09: 100 ug/kg/min via INTRAVENOUS

## 2023-04-09 MED ORDER — SODIUM CHLORIDE (PF) 0.9 % IJ SOLN
PREFILLED_SYRINGE | INTRAMUSCULAR | Status: DC | PRN
  Administered 2023-04-09: 4 mL

## 2023-04-09 MED ORDER — PROPOFOL 500 MG/50ML IV EMUL
INTRAVENOUS | Status: AC
  Filled 2023-04-09: qty 50

## 2023-04-09 MED ORDER — LACTATED RINGERS IV SOLN
INTRAVENOUS | Status: AC | PRN
  Administered 2023-04-09: 1000 mL via INTRAVENOUS

## 2023-04-09 MED ORDER — LIDOCAINE 2% (20 MG/ML) 5 ML SYRINGE
INTRAMUSCULAR | Status: DC | PRN
  Administered 2023-04-09: 100 mg via INTRAVENOUS

## 2023-04-09 MED ORDER — EPINEPHRINE 1 MG/10ML IJ SOSY
PREFILLED_SYRINGE | INTRAMUSCULAR | Status: AC
  Filled 2023-04-09: qty 10

## 2023-04-09 MED ORDER — PROPOFOL 10 MG/ML IV BOLUS
INTRAVENOUS | Status: DC | PRN
  Administered 2023-04-09 (×2): 50 mg via INTRAVENOUS
  Administered 2023-04-09: 20 mg via INTRAVENOUS
  Administered 2023-04-09: 25 mg via INTRAVENOUS

## 2023-04-09 MED ORDER — SODIUM CHLORIDE 0.9 % IV SOLN: INTRAVENOUS | Status: DC

## 2023-04-09 MED ORDER — PHENYLEPHRINE 80 MCG/ML (10ML) SYRINGE FOR IV PUSH (FOR BLOOD PRESSURE SUPPORT)
PREFILLED_SYRINGE | INTRAVENOUS | Status: DC | PRN
  Administered 2023-04-09 (×2): 160 ug via INTRAVENOUS
  Administered 2023-04-09: 80 ug via INTRAVENOUS

## 2023-04-09 SURGICAL SUPPLY — 15 items

## 2023-04-09 NOTE — Anesthesia Procedure Notes (Signed)
Procedure Name: MAC Date/Time: 04/09/2023 7:31 AM  Performed by: Adria Dill, CRNAPre-anesthesia Checklist: Patient identified, Emergency Drugs available, Suction available and Patient being monitored Patient Re-evaluated:Patient Re-evaluated prior to induction Oxygen Delivery Method: Simple face mask Preoxygenation: Pre-oxygenation with 100% oxygen Induction Type: IV induction Airway Equipment and Method: Bite block Placement Confirmation: positive ETCO2 and breath sounds checked- equal and bilateral Dental Injury: Teeth and Oropharynx as per pre-operative assessment

## 2023-04-09 NOTE — H&P (Signed)
[facility] History and Physical   Primary Care Physician:  Nelwyn Salisbury, MD   Reason for Procedure:   Removal of gastric polyp  Plan:    EGD with polypectomy     HPI: Andrew Sharp is a 52 y.o. male  here for EGD to remove large gastric polyp. EGD done 02/05/23 - incidentally noted to have an inflamed large gastric polyp in the cardia. Inflammatory in appearance. No removed at the last exam given bleeding risks. Polypectomy to be done in the hospital setting today given risks for bleeding. Otherwise feels well without any cardiopulmonary symptoms.   I have discussed risks / benefits of anesthesia and endoscopic procedure with [name] and they wish to proceed with the exams as outlined today.    Past Medical History:  Diagnosis Date   Esophageal stricture    GERD (gastroesophageal reflux disease)    History of kidney stones    Hypertension    Sleep apnea     Past Surgical History:  Procedure Laterality Date   COLONOSCOPY  03-16-06   per Dr. Arlyce Dice, nonspecific colitis and int. hemorrhoids   CT CORONARY CA SCORING  09-19-11   for chest pains, negative    KNEE ARTHROPLASTY Left     Prior to Admission medications   Medication Sig Start Date End Date Taking? Authorizing Provider  amLODipine (NORVASC) 10 MG tablet Take 1 tablet (10 mg total) by mouth daily. 01/31/23  Yes Nelwyn Salisbury, MD  baclofen (LIORESAL) 10 MG tablet TAKE 1 TABLET(10 MG) BY MOUTH AT BEDTIME 12/14/22  Yes Arnaldo Natal, NP  buPROPion (WELLBUTRIN XL) 150 MG 24 hr tablet TAKE 1 TABLET BY MOUTH DAILY 02/13/23  Yes Nelwyn Salisbury, MD  cloNIDine (CATAPRES) 0.1 MG tablet Take 1 tablet (0.1 mg total) by mouth 2 (two) times daily. 12/19/22  Yes Nelwyn Salisbury, MD  hydrALAZINE (APRESOLINE) 100 MG tablet TAKE 1 TABLET(100 MG) BY MOUTH THREE TIMES DAILY 03/13/23  Yes Nelwyn Salisbury, MD  losartan (COZAAR) 100 MG tablet TAKE 1 TABLET(100 MG) BY MOUTH DAILY 02/13/23  Yes Nelwyn Salisbury,  MD  metoprolol succinate (TOPROL-XL) 100 MG 24 hr tablet TAKE 1 TABLET BY MOUTH EVERY DAY WITH OR IMMEDIATELY FOLLOWING A MEAL 01/22/23  Yes Nelwyn Salisbury, MD  modafinil (PROVIGIL) 200 MG tablet Take 200 mg by mouth daily. 03/29/23  Yes [provider]  omeprazole (PRILOSEC) 20 MG capsule TAKE 1 CAPSULE(20 MG) BY MOUTH TWICE DAILY BEFORE MEALS 12/12/22  Yes Kennedy-Smith, Malachi Carl, NP  OVER THE COUNTER MEDICATION Take 3 capsules by mouth daily. Beet Root   Yes [provider]  phentermine 37.5 MG capsule Take 1 capsule (37.5 mg total) by mouth daily. 03/09/23  Yes Nelwyn Salisbury, MD  pregabalin (LYRICA) 75 MG capsule Take 1 capsule (75 mg total) by mouth 2 (two) times daily. 03/27/23 06/25/23 Yes London Sheer, MD  spironolactone (ALDACTONE) 50 MG tablet TAKE 1 TABLET(50 MG) BY MOUTH DAILY 03/13/23  Yes Nelwyn Salisbury, MD  diazepam (VALIUM) 5 MG tablet Take 1 tablet (5 mg total) by mouth every 8 (eight) hours as needed for anxiety. Patient not taking: Reported on 03/26/2023 01/02/23   Nelwyn Salisbury, MD  ondansetron (ZOFRAN-ODT) 4 MG disintegrating tablet Take 1 tablet (4 mg total) by mouth every 8 (eight) hours as needed for nausea or vomiting. Patient not taking: Reported on 03/26/2023 01/03/23   Arnaldo Natal, NP    Current Facility-Administered Medications  Medication Dose Route Frequency Provider Last Rate Last Admin   0.9 %  sodium chloride infusion   Intravenous Continuous Selenne Coggin, Willaim Rayas, MD       lactated ringers infusion    Continuous PRN Adela Lank, Willaim Rayas, MD 10 mL/hr at 04/09/23 0640 1,000 mL at 04/09/23 0640    Allergies as of 02/08/2023   (No Known Allergies)    Family History  Problem Relation Age of Onset   Heart disease Mother    Kidney disease Mother    Diabetes Brother        MOTHER,AUNT   Colon cancer Maternal Aunt    Pancreatic cancer Maternal Grandmother    Colon polyps Maternal Grandfather    Prostate cancer Maternal Grandfather     Crohn's disease Other        niece   Esophageal cancer Neg Hx    Rectal cancer Neg Hx    Stomach cancer Neg Hx     Social History   Socioeconomic History   Marital status: Married    Spouse name: Not on file   Number of children: 1   Years of education: Not on file   Highest education level: Associate degree: occupational, Scientist, product/process development, or vocational program  Occupational History   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[employer]   Tobacco Use   Smoking status: Never   Smokeless tobacco: Never  Vaping Use   Vaping Use: Never used  Substance and Sexual Activity   Alcohol use: No    Alcohol/week: 0.0 standard drinks of alcohol   Drug use: No   Sexual activity: Not on file  Other Topics Concern   Not on file  Social History Narrative   Not on file   Social Determinants of Health   Financial Resource Strain: Low Risk  (08/18/2022)   Overall Financial Resource Strain (CARDIA)    Difficulty of Paying Living Expenses: Not very hard  Food Insecurity: Unknown (08/18/2022)   Hunger Vital Sign    Worried About Running Out of Food in the Last Year: Never true    Ran Out of Food in the Last Year: Patient declined  Transportation Needs: No Transportation Needs (08/18/2022)   PRAPARE - Administrator, Civil Service (Medical): No    Lack of Transportation (Non-Medical): No  Physical Activity: Unknown (08/18/2022)   Exercise Vital Sign    Days of Exercise per Week: 0 days    Minutes of Exercise per Session: Not on file  Stress: Stress Concern Present (08/18/2022)   [employer] of Occupational Health - Occupational Stress Questionnaire    Feeling of Stress : To some extent  Social Connections: Unknown (08/18/2022)   Social Connection and Isolation Panel [NHANES]    Frequency of Communication with Friends and Family: Never    Frequency of Social Gatherings with Friends and Family: Never    Attends Religious Services: Patient declined    Doctor, general practice or Organizations: Patient declined    Attends Engineer, structural: Not on file    Marital Status: Married  Catering manager Violence: Not on file    Review of Systems: All other review of systems negative except as mentioned in the HPI.  Physical Exam: Vital signs BP 131/64   Pulse 77   Temp 98 F (36.7 C) (Tympanic)   Resp (!) 23   Ht 5\' 9"  (1.753 m)   Wt 117.5 kg   SpO2 97%   BMI 38.25 kg/m   General:  Alert,  Well-developed, pleasant and cooperative in NAD Lungs:  Clear throughout to auscultation.   Heart:  Regular rate and rhythm Abdomen:  Soft, nontender and nondistended.   Neuro/Psych:  Alert and cooperative. Normal mood and affect. A and O x 3  Harlin Rain, MD [facility]

## 2023-04-09 NOTE — Discharge Instructions (Signed)
YOU HAD AN ENDOSCOPIC PROCEDURE TODAY: Refer to the procedure report and other information in the discharge instructions given to you for any specific questions about what was found during the examination. If this information does not answer your questions, please call [place] office at [phone] to clarify.   YOU SHOULD EXPECT: Some feelings of bloating in the abdomen. Passage of more gas than usual. Walking can help get rid of the air that was put into your GI tract during the procedure and reduce the bloating. If you had a lower endoscopy (such as a colonoscopy or flexible sigmoidoscopy) you may notice spotting of blood in your stool or on the toilet paper. Some abdominal soreness may be present for a day or two, also.  DIET: Your first meal following the procedure should be a light meal and then it is ok to progress to your normal diet. A half-sandwich or bowl of soup is an example of a good first meal. Heavy or fried foods are harder to digest and may make you feel nauseous or bloated. Drink plenty of fluids but you should avoid alcoholic beverages for 24 hours. If you had a esophageal dilation, please see attached instructions for diet.    ACTIVITY: Your care partner should take you home directly after the procedure. You should plan to take it easy, moving slowly for the rest of the day. You can resume normal activity the day after the procedure however YOU SHOULD NOT DRIVE, use power tools, machinery or perform tasks that involve climbing or major physical exertion for 24 hours (because of the sedation medicines used during the test).   SYMPTOMS TO REPORT IMMEDIATELY: A gastroenterologist can be reached at any hour. Please call [phone]  for any of the following symptoms:  Following lower endoscopy (colonoscopy, flexible sigmoidoscopy) Excessive amounts of blood in the stool  Significant tenderness, worsening of abdominal pains  Swelling of the abdomen that is new, acute  Fever of 100 or  higher  Following upper endoscopy (EGD, EUS, ERCP, esophageal dilation) Vomiting of blood or coffee ground material  New, significant abdominal pain  New, significant chest pain or pain under the shoulder blades  Painful or persistently difficult swallowing  New shortness of breath  Black, tarry-looking or red, bloody stools  FOLLOW UP:  If any biopsies were taken you will be contacted by phone or by letter within the next 1-3 weeks. Call [phone]  if you have not heard about the biopsies in 3 weeks.  Please also call with any specific questions about appointments or follow up tests.YOU HAD AN ENDOSCOPIC PROCEDURE TODAY: Refer to the procedure report and other information in the discharge instructions given to you for any specific questions about what was found during the examination. If this information does not answer your questions, please call [place] office at [phone] to clarify.   YOU SHOULD EXPECT: Some feelings of bloating in the abdomen. Passage of more gas than usual. Walking can help get rid of the air that was put into your GI tract during the procedure and reduce the bloating. If you had a lower endoscopy (such as a colonoscopy or flexible sigmoidoscopy) you may notice spotting of blood in your stool or on the toilet paper. Some abdominal soreness may be present for a day or two, also.  DIET: Your first meal following the procedure should be a light meal and then it is ok to progress to your normal diet. A half-sandwich or bowl of soup is an example of a   good first meal. Heavy or fried foods are harder to digest and may make you feel nauseous or bloated. Drink plenty of fluids but you should avoid alcoholic beverages for 24 hours. If you had a esophageal dilation, please see attached instructions for diet.    ACTIVITY: Your care partner should take you home directly after the procedure. You should plan to take it easy, moving slowly for the rest of the day. You can resume  normal activity the day after the procedure however YOU SHOULD NOT DRIVE, use power tools, machinery or perform tasks that involve climbing or major physical exertion for 24 hours (because of the sedation medicines used during the test).   SYMPTOMS TO REPORT IMMEDIATELY: A gastroenterologist can be reached at any hour. Please call [phone]  for any of the following symptoms:  Following lower endoscopy (colonoscopy, flexible sigmoidoscopy) Excessive amounts of blood in the stool  Significant tenderness, worsening of abdominal pains  Swelling of the abdomen that is new, acute  Fever of 100 or higher  Following upper endoscopy (EGD, EUS, ERCP, esophageal dilation) Vomiting of blood or coffee ground material  New, significant abdominal pain  New, significant chest pain or pain under the shoulder blades  Painful or persistently difficult swallowing  New shortness of breath  Black, tarry-looking or red, bloody stools  FOLLOW UP:  If any biopsies were taken you will be contacted by phone or by letter within the next 1-3 weeks. Call [phone]  if you have not heard about the biopsies in 3 weeks.  Please also call with any specific questions about appointments or follow up tests. 

## 2023-04-09 NOTE — Anesthesia Postprocedure Evaluation (Signed)
Anesthesia Post Note  Patient: Andrew Sharp  Procedure(s) Performed: ESOPHAGOGASTRODUODENOSCOPY (EGD) WITH PROPOFOL POLYPECTOMY HEMOSTASIS CLIP PLACEMENT     Patient location during evaluation: PACU Anesthesia Type: MAC Level of consciousness: awake and alert Pain management: pain level controlled Vital Signs Assessment: post-procedure vital signs reviewed and stable Respiratory status: spontaneous breathing, nonlabored ventilation, respiratory function stable and patient connected to nasal cannula oxygen Cardiovascular status: blood pressure returned to baseline and stable Postop Assessment: no apparent nausea or vomiting Anesthetic complications: no   No notable events documented.  Last Vitals:  Vitals:   04/09/23 0802 04/09/23 0810  BP: (!) 146/64 113/64  Pulse: 68 68  Resp: 19 19  Temp:    SpO2: 100% 100%    Last Pain:  Vitals:   04/09/23 0820  TempSrc:   PainSc: 0-No pain                 [name]

## 2023-04-09 NOTE — Op Note (Signed)
[facility] Patient Name: Andrew Sharp Procedure Date: 04/09/2023 MRN: [id number] Attending MD: Willaim Rayas. Adela Lank , MD, [id number] Date of Birth: 01-Sep-1971 CSN: [id number] Age: 52 Admit Type: Outpatient Procedure:                Upper GI endoscopy Indications:              for removal of large gastric polyp in the cardia /                            inflammatory in appearance, here for removal at the                            hospital given bleeding risk. Providers:                Willaim Rayas. Adela Lank, MD, Marge Duncans, RN, Beryle Beams, Technician Referring MD:              Medicines:                Monitored Anesthesia Care Complications:            No immediate complications. Estimated blood loss:                            Minimal. Estimated Blood Loss:     Estimated blood loss was minimal. Procedure:                Pre-Anesthesia Assessment:                           - Prior to the procedure, a History and Physical                            was performed, and patient medications and                            allergies were reviewed. The patient's tolerance of                            previous anesthesia was also reviewed. The risks                            and benefits of the procedure and the sedation                            options and risks were discussed with the patient.                            All questions were answered, and informed consent                            was obtained. Prior Anticoagulants: The patient has  taken no anticoagulant or antiplatelet agents. ASA                            Grade Assessment: III - A patient with severe                            systemic disease. After reviewing the risks and                            benefits, the patient was deemed in satisfactory                            condition to undergo the procedure.                            After obtaining informed consent, the endoscope was                            passed under direct vision. Throughout the                            procedure, the patient's blood pressure, pulse, and                            oxygen saturations were monitored continuously. The                            [id number]-[id number] ([id number]) Olympus ultra slim endoscope                            was introduced through the mouth, and advanced to                            the second part of duodenum. The upper GI endoscopy                            was accomplished without difficulty. The patient                            tolerated the procedure well. Scope In: Scope Out: Findings:      Esophagogastric landmarks were identified: the Z-line was found at 39       cm, the gastroesophageal junction was found at 39 cm and the upper       extent of the gastric folds was found at 40 cm from the incisors.      A 1 cm hiatal hernia was present.      The exam of the esophagus was otherwise normal.      A single roughly 12 mm sessile polyp with no bleeding was found in the       cardia. Area was successfully injected with 2 mL of a diluted 0.1 mg/mL       solution of epinephrine for drug delivery. The polyp was then removed       with a hot snare and withdrawn from the patient using a Roth net.  Resection and retrieval were complete. To prevent bleeding after the       polypectomy, two hemostatic clips were successfully placed across the       defect.      A few sessile polyps were found in the gastric body, grossly consistent       with benign fundic gland polyps..      The exam of the stomach was otherwise normal.      The examined duodenum was normal. Impression:               - Esophagogastric landmarks identified.                           - 1 cm hiatal hernia.                           - A single gastric polyp. Resected and retrieved.                            Injected. Clips were placed.                            - A few gastric polyps, benign appearing.                           - Normal stomach otherwise.                           - Normal examined duodenum. Moderate Sedation:      No moderate sedation, case performed with MAC Recommendation:           - Patient has a contact number available for                            emergencies. The signs and symptoms of potential                            delayed complications were discussed with the                            patient. Return to normal activities tomorrow.                            Written discharge instructions were provided to the                            patient.                           - Resume previous diet.                           - Continue present medications.                           - Continue omeprazole                           -  Await pathology results.                           - No ibuprofen, naproxen, or other non-steroidal                            anti-inflammatory drugs for 2 weeks after polyp                            removal. Procedure Code(s):        --- Professional ---                           [phone], Esophagogastroduodenoscopy, flexible,                            transoral; with removal of tumor(s), polyp(s), or                            other lesion(s) by snare technique                           [id number], Esophagogastroduodenoscopy, flexible,                            transoral; with directed submucosal injection(s),                            any substance Diagnosis Code(s):        --- Professional ---                           K44.9, Diaphragmatic hernia without obstruction or                            gangrene                           K31.7, Polyp of stomach and duodenum CPT copyright 2022 American Medical Association. All rights reserved. The codes documented in this report are preliminary and upon coder review may  be revised to meet current compliance requirements. Viviann Spare  P. Jonika Critz, MD 04/09/2023 8:04:07 AM This report has been signed electronically. Number of Addenda: 0

## 2023-04-09 NOTE — Transfer of Care (Signed)
Immediate Anesthesia Transfer of Care Note  Patient: Andrew Sharp  Procedure(s) Performed: ESOPHAGOGASTRODUODENOSCOPY (EGD) WITH PROPOFOL POLYPECTOMY HEMOSTASIS CLIP PLACEMENT  Patient Location: PACU  Anesthesia Type:MAC  Level of Consciousness: drowsy and patient cooperative  Airway & Oxygen Therapy: Patient Spontanous Breathing and Patient connected to face mask oxygen  Post-op Assessment: Report given to RN and Post -op Vital signs reviewed and stable  Post vital signs: Reviewed and stable  Last Vitals:  Vitals Value Taken Time  BP    Temp    Pulse 70 04/09/23 0806  Resp 21 04/09/23 0806  SpO2 100 % 04/09/23 0806  Vitals shown include unvalidated device data.  Last Pain:  Vitals:   04/09/23 0629  TempSrc: Tympanic  PainSc: 0-No pain         Complications: No notable events documented.

## 2023-04-09 NOTE — Anesthesia Postprocedure Evaluation (Signed)
Anesthesia Post Note  Patient: Andrew Sharp  Procedure(s) Performed: ESOPHAGOGASTRODUODENOSCOPY (EGD) WITH PROPOFOL POLYPECTOMY HEMOSTASIS CLIP PLACEMENT     Patient location during evaluation: PACU Anesthesia Type: MAC Level of consciousness: awake and alert Pain management: pain level controlled Vital Signs Assessment: post-procedure vital signs reviewed and stable Respiratory status: spontaneous breathing, nonlabored ventilation, respiratory function stable and patient connected to nasal cannula oxygen Cardiovascular status: stable and blood pressure returned to baseline Postop Assessment: no apparent nausea or vomiting Anesthetic complications: no   No notable events documented.  Last Vitals:  Vitals:   04/09/23 0802 04/09/23 0810  BP: (!) 146/64 113/64  Pulse: 68 68  Resp: 19 19  Temp:    SpO2: 100% 100%    Last Pain:  Vitals:   04/09/23 0820  TempSrc:   PainSc: 0-No pain                 Dock Baccam

## 2023-04-11 LAB — SURGICAL PATHOLOGY

## 2023-04-12 ENCOUNTER — Encounter (HOSPITAL_COMMUNITY): Payer: Self-pay | Admitting: Gastroenterology

## 2023-04-12 ENCOUNTER — Other Ambulatory Visit: Payer: Self-pay | Admitting: Family Medicine

## 2023-04-12 DIAGNOSIS — I10 Essential (primary) hypertension: Secondary | ICD-10-CM

## 2023-05-01 IMAGING — US US ABDOMEN LIMITED
1 series · 14 of 25 positions shown · non-contrast
Comparison: None.

CLINICAL DATA: Nausea x4 months.

EXAM:
ULTRASOUND ABDOMEN LIMITED RIGHT UPPER QUADRANT

[Series 1: us abdomen limited ruq (liver/gb) · 14 of 36 slices shown]
[im 1/36]
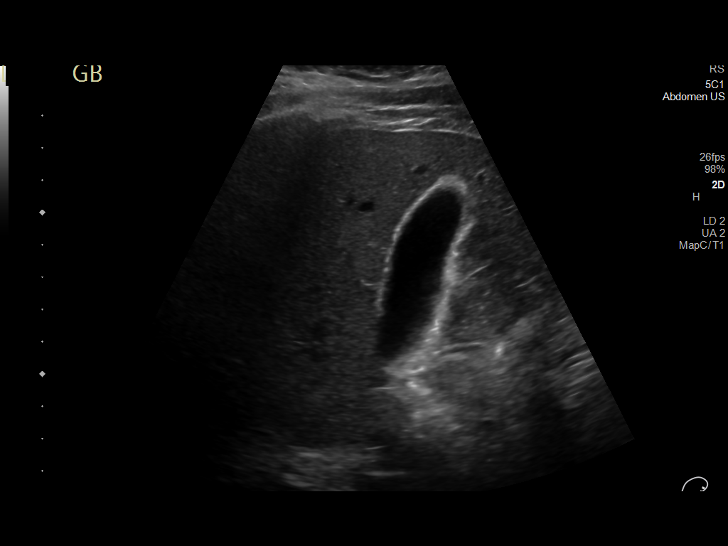
[im 3/36]
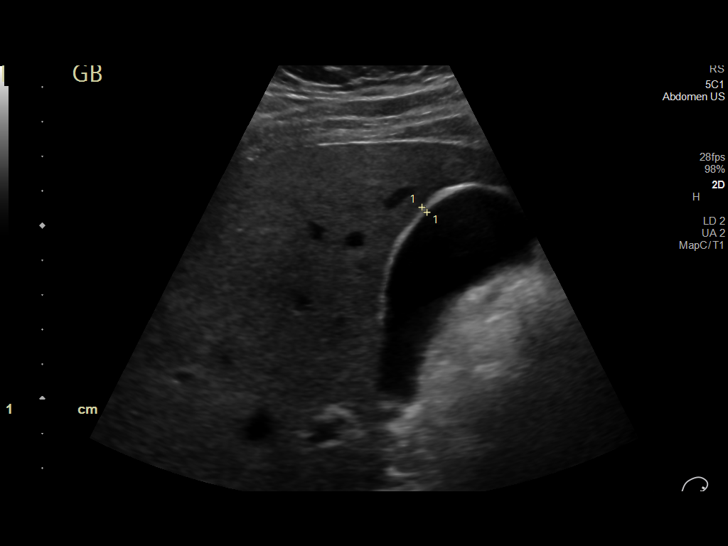
[im 6/36]
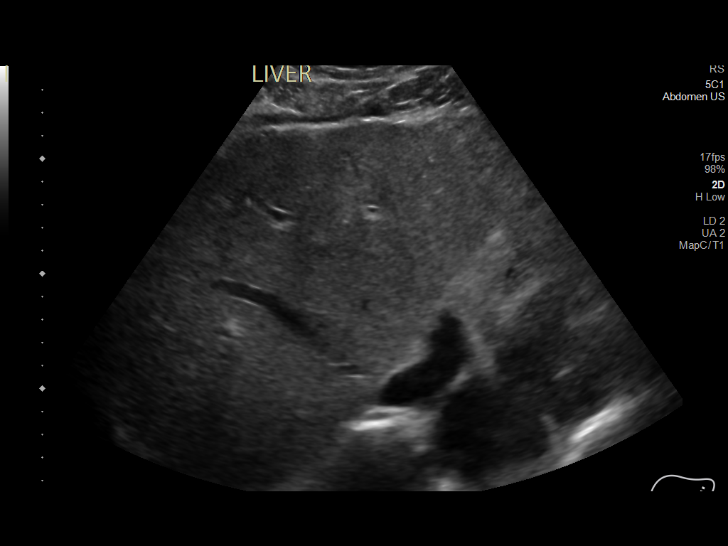
[im 9/36]
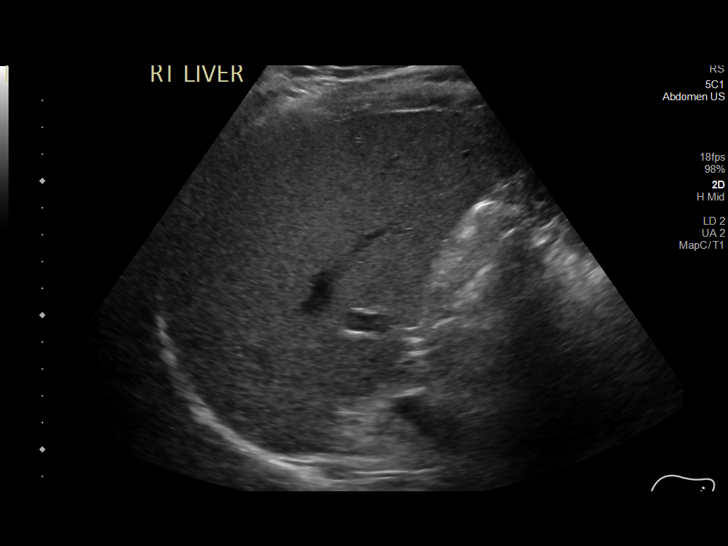
[im 12/36]
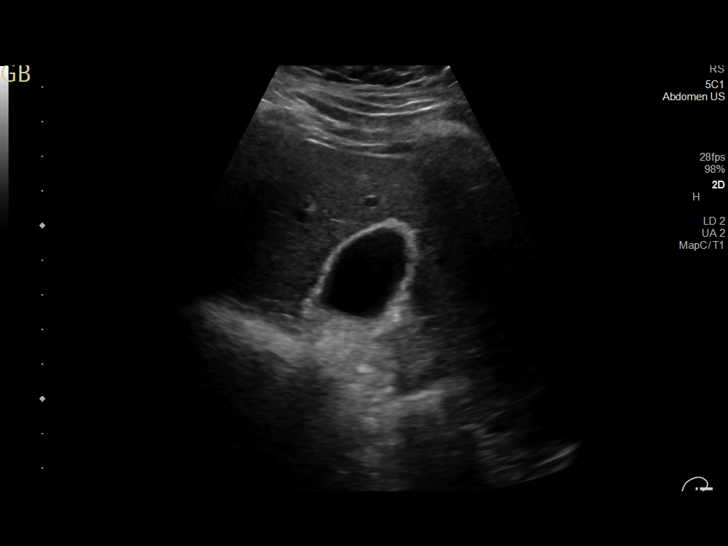
[im 14/36]
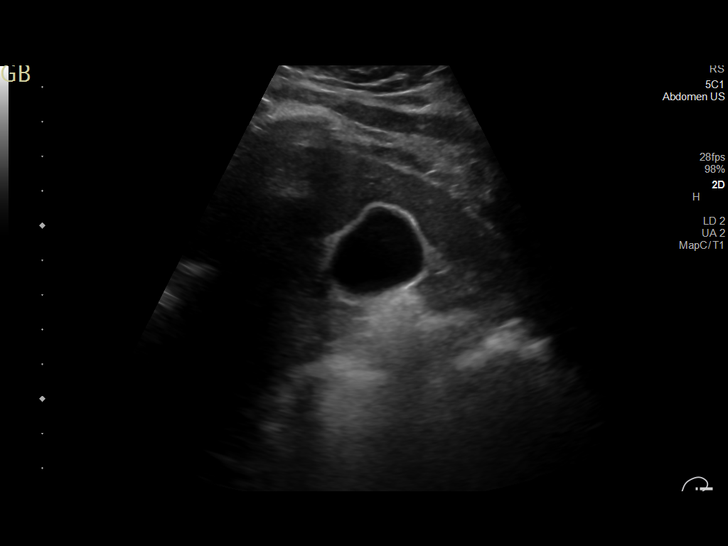
[im 17/36]
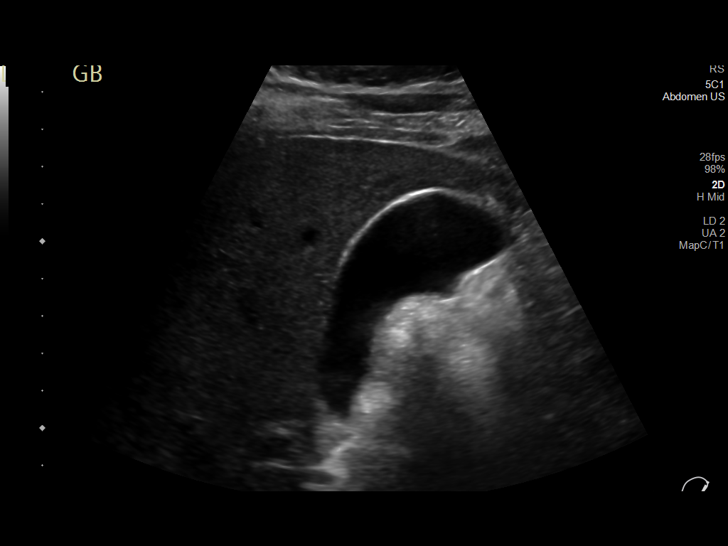
[im 19/36]
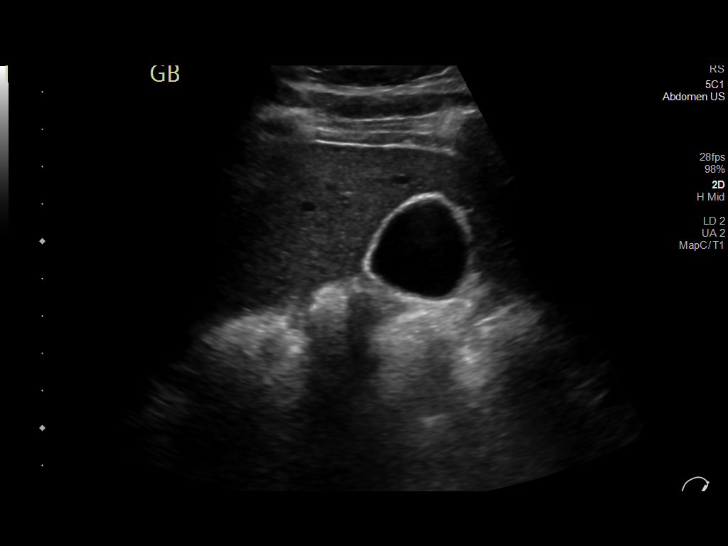
[im 22/36]
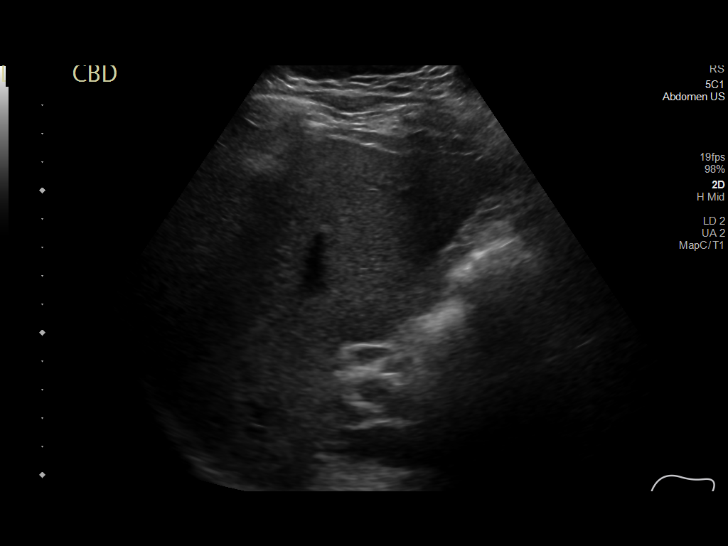
[im 24/36]
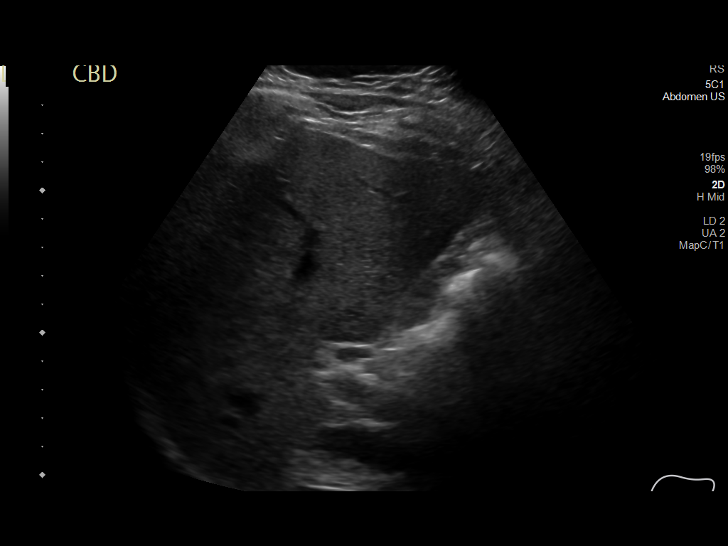
[im 27/36]
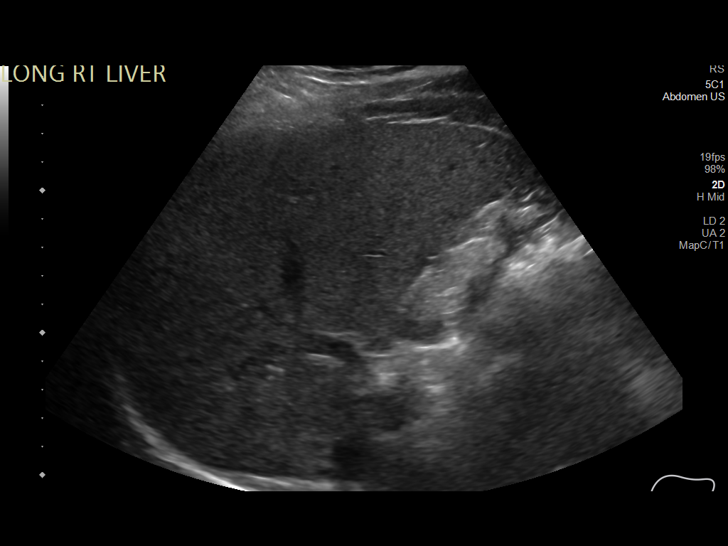
[im 30/36]
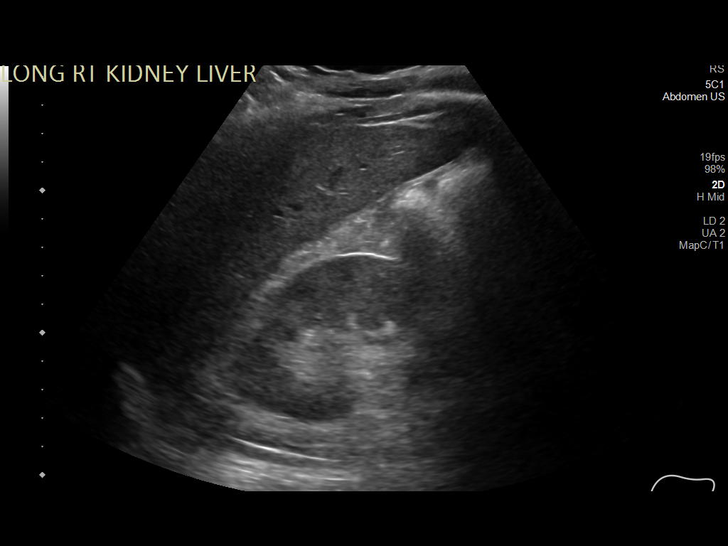
[im 33/36]
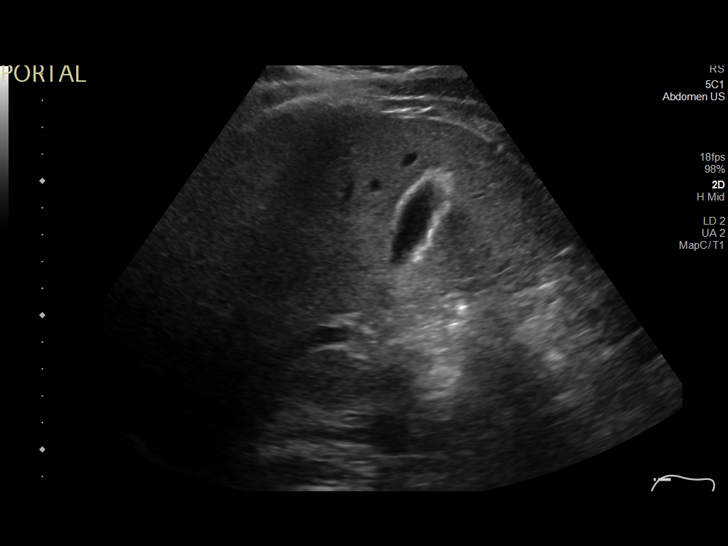
[im 36/36]
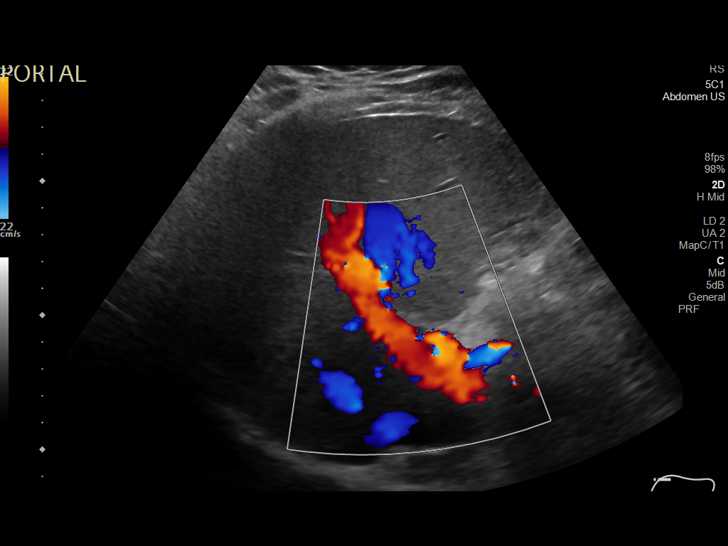

[14 of 25 positions shown; findings below may reference images not displayed]

FINDINGS: Gallbladder:

No gallstones or wall thickening visualized (2.0 mm). No sonographic
Murphy sign noted by sonographer.

Common bile duct:

Diameter: 4.9 mm (poorly visualized)

Liver:

No focal lesion identified. The left lobe is limited in
visualization. Diffusely increased echogenicity of the liver
parenchyma is seen. Portal vein is patent on color Doppler imaging
with normal direction of blood flow towards the liver.

Other: The study is limited secondary to the patient's body habitus
and overlying bowel gas.
IMPRESSION: 1. Limited study, as described above, without evidence of
cholelithiasis or acute cholecystitis.
2. Findings suggestive of hepatic steatosis.

## 2023-05-12 ENCOUNTER — Other Ambulatory Visit: Payer: Self-pay | Admitting: Family Medicine

## 2023-05-16 ENCOUNTER — Encounter: Payer: Self-pay | Admitting: Family Medicine

## 2023-05-16 ENCOUNTER — Other Ambulatory Visit: Payer: Self-pay | Admitting: Family Medicine

## 2023-05-17 MED ORDER — TOPIRAMATE 50 MG PO TABS: 50.0000 mg | ORAL_TABLET | Freq: Every day | ORAL | 5 refills | Status: DC

## 2023-05-17 MED ORDER — METOPROLOL SUCCINATE ER 100 MG PO TB24: 100.0000 mg | ORAL_TABLET | Freq: Every day | ORAL | 0 refills | Status: DC

## 2023-05-17 NOTE — Telephone Encounter (Signed)
New Rx sent to pt pharmacy and pt notified to pick up Rx

## 2023-05-17 NOTE — Telephone Encounter (Signed)
We will add Topiramate to the Phentermine. Call in Topiramate 50 mg daily, #30 with 5 rf

## 2023-06-04 ENCOUNTER — Encounter: Payer: Self-pay | Admitting: Family Medicine

## 2023-06-05 NOTE — Telephone Encounter (Signed)
Yes he can get that any time now

## 2023-06-08 ENCOUNTER — Ambulatory Visit: Payer: 59 | Admitting: Family Medicine

## 2023-06-11 ENCOUNTER — Other Ambulatory Visit: Payer: Self-pay | Admitting: Family Medicine

## 2023-06-11 DIAGNOSIS — I10 Essential (primary) hypertension: Secondary | ICD-10-CM

## 2023-06-12 ENCOUNTER — Encounter: Payer: 59 | Admitting: Family Medicine

## 2023-06-12 ENCOUNTER — Encounter: Payer: Self-pay | Admitting: Family Medicine

## 2023-07-10 ENCOUNTER — Other Ambulatory Visit: Payer: Self-pay | Admitting: Family Medicine

## 2023-07-10 DIAGNOSIS — I10 Essential (primary) hypertension: Secondary | ICD-10-CM

## 2023-07-14 ENCOUNTER — Other Ambulatory Visit: Payer: Self-pay | Admitting: Orthopedic Surgery

## 2023-07-16 ENCOUNTER — Encounter: Payer: 59 | Admitting: Family Medicine

## 2023-07-17 ENCOUNTER — Encounter: Payer: 59 | Admitting: Family Medicine

## 2023-07-18 ENCOUNTER — Encounter: Payer: 59 | Admitting: Family Medicine

## 2023-07-29 ENCOUNTER — Other Ambulatory Visit: Payer: Self-pay | Admitting: Pulmonary Disease

## 2023-08-02 ENCOUNTER — Encounter: Payer: Self-pay | Admitting: Pulmonary Disease

## 2023-08-09 ENCOUNTER — Other Ambulatory Visit: Payer: Self-pay | Admitting: Family Medicine

## 2023-08-10 ENCOUNTER — Ambulatory Visit: Payer: 59 | Admitting: Orthopedic Surgery

## 2023-08-10 ENCOUNTER — Encounter: Payer: 59 | Admitting: Family Medicine

## 2023-08-10 ENCOUNTER — Other Ambulatory Visit (INDEPENDENT_AMBULATORY_CARE_PROVIDER_SITE_OTHER): Payer: 59

## 2023-08-10 DIAGNOSIS — M5416 Radiculopathy, lumbar region: Secondary | ICD-10-CM

## 2023-08-11 NOTE — Progress Notes (Signed)
Orthopedic Spine Surgery Office Note   Assessment: Patient is a 52 y.o. male with low back pain that radiates into the right buttock and posterior thigh.  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 EMG/NCS evidence of L5 radiculopathy     Plan: -Patient has tried NSAIDs, Tylenol, physical therapy, steroid injections, Lyrica -Since medications are no longer providing him with adequate relief, I told him surgery or pain management would be his next options. I talked about L4 and L5 segment laminectomies with partial medial facetectomies as a surgical treatment. I went over the risks, benefits, and alternatives with him. After this discussion, he wanted to think about it more. I told him to call the office once he has had time to think if he has questions, thoughts about other treatments, or would like to proceed with surgery     Patient expressed understanding of the plan and all questions were answered to the patient's satisfaction.    ___________________________________________________________________________   History: Patient is a 52 y.o. male who comes in today for follow up on his lumbar spine.  He is still reporting low back pain that radiates into the right buttock and right posterior thigh.  He states that the pain has gotten worse since the last time I saw him.  He has not developed any new symptoms.  He has been trying the medications that were previously prescribed but they are not working as well as they had been.  He feels the pain on a daily basis.  He feels the pain with activity and at rest.   Previous treatments: NSAIDs, Tylenol, physical therapy, steroid injections, Lyrica   Physical Exam:   General: no acute distress, appears stated age Neurologic: alert, answering questions appropriately, following commands Respiratory: unlabored breathing on room air, symmetric chest rise Psychiatric: appropriate affect, normal cadence to speech     MSK (spine):   -Strength exam                                                    Left                  Right EHL                              5/5                  5/5 TA                                 5/5                  5/5 GSC                             5/5                  5/5 Knee extension            5/5                  5/5 Hip flexion                    5/5  5/5   -Sensory exam                           Sensation intact to light touch in L3-S1 nerve distributions of bilateral lower extremities   -Achilles DTR: 2/4 on the left, 2/4 on the right -Patellar tendon DTR: 2/4 on the left, 2/4 on the right   -Straight leg raise: negative bilaterally -Femoral nerve stretch test: negative bilaterally -Clonus: no beats bilaterally   -Left hip exam: No pain through range of motion, negative Stinchfield, negative FABER -Right hip exam: No pain through remainder of range of motion, negative Stinchfield, negative FABER   Imaging: XRs of the lumbar spine from 08/10/2023 were independently reviewed and interpreted, showing small anterior osteophyte formation at L4/5. No other significant degenerative changes seen. No evidence of instability on flexion/extension view. No fracture or dislocation seen.    MRI (on disc) of the lumbar spine from 05/12/2022 was previously independently reviewed and interpreted, showing lateral recess and central stenosis at L4/5.  No other significant stenosis.   EMG/NCS from 03/08/2023 was reviewed and shows chronic right L5 radiculopathy (moderate)     Patient name: Andrew Sharp Patient MRN: [id number] Date of visit: 08/10/2023

## 2023-08-15 ENCOUNTER — Encounter: Payer: Self-pay | Admitting: Family Medicine

## 2023-08-16 ENCOUNTER — Other Ambulatory Visit: Payer: Self-pay | Admitting: Family Medicine

## 2023-08-16 DIAGNOSIS — I10 Essential (primary) hypertension: Secondary | ICD-10-CM

## 2023-08-16 MED ORDER — CLONIDINE HCL 0.1 MG PO TABS
0.1000 mg | ORAL_TABLET | Freq: Two times a day (BID) | ORAL | 5 refills | Status: DC
Start: 2023-08-16 — End: 2023-12-14

## 2023-08-20 ENCOUNTER — Ambulatory Visit: Payer: 59 | Admitting: Orthopedic Surgery

## 2023-09-10 ENCOUNTER — Other Ambulatory Visit: Payer: Self-pay | Admitting: Family Medicine

## 2023-09-10 DIAGNOSIS — I10 Essential (primary) hypertension: Secondary | ICD-10-CM

## 2023-10-10 ENCOUNTER — Encounter: Payer: Self-pay | Admitting: Orthopedic Surgery

## 2023-10-11 ENCOUNTER — Other Ambulatory Visit: Payer: Self-pay | Admitting: Family Medicine

## 2023-10-11 DIAGNOSIS — I10 Essential (primary) hypertension: Secondary | ICD-10-CM

## 2023-10-14 ENCOUNTER — Other Ambulatory Visit: Payer: Self-pay | Admitting: Family Medicine

## 2023-10-15 NOTE — Telephone Encounter (Signed)
Pt LOV was on 12/08/22 Please advise

## 2023-10-29 ENCOUNTER — Other Ambulatory Visit: Payer: Self-pay | Admitting: Pulmonary Disease

## 2023-11-01 ENCOUNTER — Encounter: Payer: Self-pay | Admitting: Pulmonary Disease

## 2023-11-01 NOTE — Telephone Encounter (Signed)
Pt is requesting refill. Please advise.

## 2023-11-10 ENCOUNTER — Other Ambulatory Visit: Payer: Self-pay | Admitting: Family Medicine

## 2023-11-14 ENCOUNTER — Other Ambulatory Visit: Payer: Self-pay | Admitting: Orthopedic Surgery

## 2023-12-02 ENCOUNTER — Other Ambulatory Visit: Payer: Self-pay | Admitting: Pulmonary Disease

## 2023-12-05 ENCOUNTER — Ambulatory Visit: Payer: 59 | Admitting: Orthopedic Surgery

## 2023-12-12 ENCOUNTER — Ambulatory Visit: Payer: 59 | Admitting: Orthopedic Surgery

## 2023-12-13 ENCOUNTER — Other Ambulatory Visit: Payer: Self-pay | Admitting: Family Medicine

## 2023-12-13 DIAGNOSIS — I10 Essential (primary) hypertension: Secondary | ICD-10-CM

## 2023-12-14 ENCOUNTER — Encounter: Payer: Self-pay | Admitting: Family Medicine

## 2023-12-14 ENCOUNTER — Ambulatory Visit: Payer: 59 | Admitting: Family Medicine

## 2023-12-14 VITALS — BP 118/70 | HR 73 | Temp 98.4°F | Ht 69.5 in | Wt 268.0 lb

## 2023-12-14 DIAGNOSIS — G4733 Obstructive sleep apnea (adult) (pediatric): Secondary | ICD-10-CM | POA: Diagnosis not present

## 2023-12-14 DIAGNOSIS — Z0000 Encounter for general adult medical examination without abnormal findings: Secondary | ICD-10-CM

## 2023-12-14 DIAGNOSIS — I10 Essential (primary) hypertension: Secondary | ICD-10-CM

## 2023-12-14 DIAGNOSIS — Z125 Encounter for screening for malignant neoplasm of prostate: Secondary | ICD-10-CM

## 2023-12-14 DIAGNOSIS — Z13220 Encounter for screening for lipoid disorders: Secondary | ICD-10-CM | POA: Diagnosis not present

## 2023-12-14 LAB — CBC WITH DIFFERENTIAL/PLATELET
Basophils Absolute: 0 10*3/uL (ref 0.0–0.1)
Basophils Relative: 0.4 % (ref 0.0–3.0)
Eosinophils Absolute: 0.3 10*3/uL (ref 0.0–0.7)
Eosinophils Relative: 3 % (ref 0.0–5.0)
HCT: 40.4 % (ref 39.0–52.0)
Hemoglobin: 14 g/dL (ref 13.0–17.0)
Lymphocytes Relative: 30.3 % (ref 12.0–46.0)
Lymphs Abs: 3.1 10*3/uL (ref 0.7–4.0)
MCHC: 34.6 g/dL (ref 30.0–36.0)
MCV: 94.1 fL (ref 78.0–100.0)
Monocytes Absolute: 1.4 10*3/uL — ABNORMAL HIGH (ref 0.1–1.0)
Monocytes Relative: 13.5 % — ABNORMAL HIGH (ref 3.0–12.0)
Neutro Abs: 5.3 10*3/uL (ref 1.4–7.7)
Neutrophils Relative %: 52.8 % (ref 43.0–77.0)
Platelets: 255 10*3/uL (ref 150.0–400.0)
RBC: 4.3 Mil/uL (ref 4.22–5.81)
RDW: 13.2 % (ref 11.5–15.5)
WBC: 10.1 10*3/uL (ref 4.0–10.5)

## 2023-12-14 LAB — PSA: PSA: 0.85 ng/mL (ref 0.10–4.00)

## 2023-12-14 LAB — LIPID PANEL
Cholesterol: 172 mg/dL (ref 0–200)
HDL: 27.5 mg/dL — ABNORMAL LOW (ref >39.00)
NonHDL: 144.65
Total CHOL/HDL Ratio: 6
Triglycerides: 615 mg/dL — ABNORMAL HIGH (ref 0.0–149.0)
VLDL: 123 mg/dL — ABNORMAL HIGH (ref 0.0–40.0)

## 2023-12-14 LAB — HEPATIC FUNCTION PANEL
ALT: 21 U/L (ref 0–53)
AST: 21 U/L (ref 0–37)
Albumin: 4.3 g/dL (ref 3.5–5.2)
Alkaline Phosphatase: 63 U/L (ref 39–117)
Bilirubin, Direct: 0.1 mg/dL (ref 0.0–0.3)
Total Bilirubin: 0.3 mg/dL (ref 0.2–1.2)
Total Protein: 7.6 g/dL (ref 6.0–8.3)

## 2023-12-14 LAB — BASIC METABOLIC PANEL
BUN: 14 mg/dL (ref 6–23)
CO2: 28 meq/L (ref 19–32)
Calcium: 9.3 mg/dL (ref 8.4–10.5)
Chloride: 103 meq/L (ref 96–112)
Creatinine, Ser: 1.48 mg/dL (ref 0.40–1.50)
GFR: 53.9 mL/min — ABNORMAL LOW (ref >60.00)
Glucose, Bld: 85 mg/dL (ref 70–99)
Potassium: 4.1 meq/L (ref 3.5–5.1)
Sodium: 138 meq/L (ref 135–145)

## 2023-12-14 LAB — HEMOGLOBIN A1C: Hgb A1c MFr Bld: 6.4 % (ref 4.6–6.5)

## 2023-12-14 LAB — LDL CHOLESTEROL, DIRECT: Direct LDL: 67 mg/dL

## 2023-12-14 LAB — TSH: TSH: 2.65 u[IU]/mL (ref 0.35–5.50)

## 2023-12-14 MED ORDER — BUPROPION HCL ER (XL) 150 MG PO TB24: 150.0000 mg | ORAL_TABLET | Freq: Every day | ORAL | 3 refills | Status: AC

## 2023-12-14 MED ORDER — AMLODIPINE BESYLATE 10 MG PO TABS: 10.0000 mg | ORAL_TABLET | Freq: Every day | ORAL | 3 refills | Status: DC

## 2023-12-14 MED ORDER — SPIRONOLACTONE 50 MG PO TABS: 50.0000 mg | ORAL_TABLET | Freq: Every day | ORAL | 3 refills | Status: DC

## 2023-12-14 MED ORDER — OMEPRAZOLE 20 MG PO CPDR: DELAYED_RELEASE_CAPSULE | ORAL | 3 refills | Status: DC

## 2023-12-14 MED ORDER — HYDRALAZINE HCL 100 MG PO TABS: 100.0000 mg | ORAL_TABLET | Freq: Three times a day (TID) | ORAL | 3 refills | Status: AC

## 2023-12-14 MED ORDER — CLONIDINE HCL 0.1 MG PO TABS: 0.1000 mg | ORAL_TABLET | Freq: Two times a day (BID) | ORAL | 3 refills | Status: DC

## 2023-12-14 MED ORDER — METOPROLOL SUCCINATE ER 100 MG PO TB24: 100.0000 mg | ORAL_TABLET | Freq: Every day | ORAL | 3 refills | Status: AC

## 2023-12-14 MED ORDER — LOSARTAN POTASSIUM 100 MG PO TABS: 100.0000 mg | ORAL_TABLET | Freq: Every day | ORAL | 3 refills | Status: AC

## 2023-12-14 NOTE — Progress Notes (Signed)
Subjective:    Patient ID: Andrew Sharp, male    DOB: 1971/06/26, 53 y.o.   MRN: [id number]  HPI Here for a well exam. He feels well. He is frustrated by his inability to lose weight. He wants to stop the Phentermine and Topiramate because they have not helped. He tries to eat a healthy diet, but he gets no exercise at all.    Review of Systems  Constitutional: Negative.   HENT: Negative.    Eyes: Negative.   Respiratory: Negative.    Cardiovascular: Negative.   Gastrointestinal: Negative.   Genitourinary: Negative.   Musculoskeletal: Negative.   Skin: Negative.   Neurological: Negative.   Psychiatric/Behavioral: Negative.         Objective:   Physical Exam Constitutional:      General: He is not in acute distress.    Appearance: He is well-developed. He is obese. He is not diaphoretic.  HENT:     Head: Normocephalic and atraumatic.     Right Ear: External ear normal.     Left Ear: External ear normal.     Nose: Nose normal.     Mouth/Throat:     Pharynx: No oropharyngeal exudate.  Eyes:     General: No scleral icterus.       Right eye: No discharge.        Left eye: No discharge.     Conjunctiva/sclera: Conjunctivae normal.     Pupils: Pupils are equal, round, and reactive to light.  Neck:     Thyroid: No thyromegaly.     Vascular: No JVD.     Trachea: No tracheal deviation.  Cardiovascular:     Rate and Rhythm: Normal rate and regular rhythm.     Pulses: Normal pulses.     Heart sounds: Normal heart sounds. No murmur heard.    No friction rub. No gallop.  Pulmonary:     Effort: Pulmonary effort is normal. No respiratory distress.     Breath sounds: Normal breath sounds. No wheezing or rales.  Chest:     Chest wall: No tenderness.  Abdominal:     General: Bowel sounds are normal. There is no distension.     Palpations: Abdomen is soft. There is no mass.     Tenderness: There is no abdominal tenderness. There is no guarding or rebound.   Genitourinary:    Penis: Normal. No tenderness.      Testes: Normal.     Prostate: Normal.     Rectum: Normal. Guaiac result negative.  Musculoskeletal:        General: No tenderness. Normal range of motion.     Cervical back: Neck supple.  Lymphadenopathy:     Cervical: No cervical adenopathy.  Skin:    General: Skin is warm and dry.     Coloration: Skin is not pale.     Findings: No erythema or rash.  Neurological:     General: No focal deficit present.     Mental Status: He is alert and oriented to person, place, and time.     Cranial Nerves: No cranial nerve deficit.     Motor: No abnormal muscle tone.     Coordination: Coordination normal.     Deep Tendon Reflexes: Reflexes are normal and symmetric. Reflexes normal.  Psychiatric:        Mood and Affect: Mood normal.        Behavior: Behavior normal.        Thought Content: Thought content  normal.        Judgment: Judgment normal.           Assessment & Plan:  Well exam. We discussed diet and exercise. Get fasting labs. He will check with his insurance company to see if they will cover any of the GLP-1 medications to help him lose weight.  Gershon Crane, MD

## 2023-12-17 ENCOUNTER — Encounter: Payer: Self-pay | Admitting: Family Medicine

## 2023-12-19 ENCOUNTER — Encounter: Payer: Self-pay | Admitting: *Deleted

## 2023-12-20 NOTE — Telephone Encounter (Signed)
 Tell him to ask his pharmacist how much it would cost if he paid cash for one of these medications and used [facility]

## 2023-12-20 NOTE — Telephone Encounter (Signed)
 He has very mild renal insufficiency, which means the kidneys filter blood a little slower than usual. This has been stable for years though. He should be fine if he drinks plenty of water every day

## 2023-12-25 MED ORDER — OZEMPIC (0.25 OR 0.5 MG/DOSE) 2 MG/1.5ML ~~LOC~~ SOPN: 0.2500 mg | PEN_INJECTOR | SUBCUTANEOUS | 3 refills | Status: DC

## 2023-12-25 NOTE — Telephone Encounter (Signed)
 His last A1c was 6.4%, so he is prediabetic. I sent in for Ozempic shots weekly.

## 2023-12-26 ENCOUNTER — Telehealth: Payer: Self-pay | Admitting: Pharmacist

## 2023-12-26 NOTE — Telephone Encounter (Signed)
 Ozempic is approved exclusively as an adjunct to diet and exercise to improve glycemic control in adults with type 2 diabetes mellitus. A review of [name] medical chart reveals no documented diagnosis of type 2 diabetes, only prediabetes. Therefore, he does not currently meet the criteria for prior authorization of this medication. If clinically appropriate, alternative options such as [name], or [name] may be considered for this patient.  Thank you, Dellie Burns, PharmD Clinical Pharmacist  [place]  Direct Dial: [phone]

## 2023-12-27 NOTE — Telephone Encounter (Signed)
 Apparently his insurance will not cover any of the GLP-1 medications. If he agrees, I would like to refer him to the [facility] weight management clinic. Sometimes they can get medications like these approved

## 2023-12-27 NOTE — Telephone Encounter (Signed)
 Ozempic is approved exclusively as an adjunct to diet and exercise to improve glycemic control in adults with type 2 diabetes mellitus. A review of [name] medical chart reveals no documented diagnosis of type 2 diabetes, only prediabetes. Therefore, he does not currently meet the criteria for prior authorization of this medication. If clinically appropriate, alternative options such as [name], or [name] may be considered for this patient.  Thank you, Dellie Burns, PharmD Clinical Pharmacist  [place]  Direct Dial: [phone]

## 2023-12-28 NOTE — Telephone Encounter (Signed)
 Left a message for pt advised to call the office regarding this message

## 2024-01-04 DIAGNOSIS — E669 Obesity, unspecified: Secondary | ICD-10-CM

## 2024-01-07 ENCOUNTER — Other Ambulatory Visit: Payer: Self-pay | Admitting: Nurse Practitioner

## 2024-01-07 ENCOUNTER — Other Ambulatory Visit: Payer: Self-pay | Admitting: Orthopedic Surgery

## 2024-01-09 ENCOUNTER — Telehealth: Payer: Self-pay | Admitting: Gastroenterology

## 2024-01-09 NOTE — Telephone Encounter (Signed)
 Inbound call from layla with [facility] requesting refill for baclofen.   Please advise. Thank you

## 2024-01-10 ENCOUNTER — Other Ambulatory Visit: Payer: Self-pay

## 2024-01-10 ENCOUNTER — Other Ambulatory Visit (HOSPITAL_COMMUNITY): Payer: Self-pay

## 2024-01-10 MED ORDER — BACLOFEN 10 MG PO TABS: 10.0000 mg | ORAL_TABLET | Freq: Every day | ORAL | 1 refills | Status: DC

## 2024-01-10 NOTE — Telephone Encounter (Signed)
 Called patient and let him know we can send in script but he needs an appointment.  Scheduled him to see Jill Side on May 1st.  Send baclofen to his pharmacy

## 2024-01-10 NOTE — Telephone Encounter (Signed)
 Okay to refill baclofen and yes he does need a follow up visit with me. Thanks

## 2024-01-12 ENCOUNTER — Other Ambulatory Visit: Payer: Self-pay | Admitting: Nurse Practitioner

## 2024-01-14 ENCOUNTER — Encounter (INDEPENDENT_AMBULATORY_CARE_PROVIDER_SITE_OTHER): Payer: Self-pay

## 2024-01-14 ENCOUNTER — Telehealth: Payer: Self-pay

## 2024-01-14 DIAGNOSIS — E669 Obesity, unspecified: Secondary | ICD-10-CM | POA: Insufficient documentation

## 2024-01-14 NOTE — Telephone Encounter (Signed)
 I did the referral

## 2024-01-14 NOTE — Telephone Encounter (Signed)
 Copied from CRM [id number]. Topic: Clinical - Prescription Issue >> Jan 14, 2024 12:57 PM Sim Boast F wrote: Reason for CRM: [facility] called regarding prior authorization for Ozympic, says the office can request for authorization on [email or web address] with key code: [id number]. There call back number is [phone] opt 2.

## 2024-01-15 NOTE — Telephone Encounter (Signed)
 Ozempic is only approved for type 2 diabetes. Patient does not have type 2 diabetes, therefore he does not qualify. Please see telephone encounter on 12/26/23.

## 2024-01-15 NOTE — Telephone Encounter (Signed)
 This was sent to our PA team for advise

## 2024-01-17 ENCOUNTER — Telehealth: Payer: Self-pay

## 2024-01-17 NOTE — Telephone Encounter (Signed)
 Pharmacy Patient Advocate Encounter   Received notification from Patient Advice Request messages that prior authorization for Ozempic (0.25 or 0.5 MG/DOSE) 2MG /3ML pen-injectors is required/requested.   Insurance verification completed.   The patient is insured through [facility] .   Per test claim: PA required; PA submitted to above mentioned insurance via CoverMyMeds Key/confirmation #/EOC [id number] Status is pending

## 2024-01-18 NOTE — Telephone Encounter (Signed)
 Pharmacy Patient Advocate Encounter  Received notification from [facility] that Prior Authorization for Ozempic (0.25 or 0.5 MG/DOSE) 2MG /3ML pen-injectors  has been DENIED.  Full denial letter will be uploaded to the media tab. See denial reason below.   PA #/Case ID/Reference #: [id number]

## 2024-01-20 ENCOUNTER — Other Ambulatory Visit: Payer: Self-pay | Admitting: Family Medicine

## 2024-01-20 DIAGNOSIS — I10 Essential (primary) hypertension: Secondary | ICD-10-CM

## 2024-01-21 NOTE — Telephone Encounter (Signed)
 Left detailed message regarding PA denial, advised to call the office with any questions

## 2024-01-23 ENCOUNTER — Encounter: Payer: Self-pay | Admitting: Family Medicine

## 2024-01-28 ENCOUNTER — Telehealth: Payer: Self-pay | Admitting: Family Medicine

## 2024-01-28 NOTE — Telephone Encounter (Signed)
 Copied from CRM [id number]. Topic: General - Other >> Jan 28, 2024  3:33 PM Denese Killings wrote: Reason for CRM: Angus Palms with [employer] wants to know if we received a prescription for CPAP supplies. She states that it was electronically sent over on 03/26.

## 2024-01-29 ENCOUNTER — Ambulatory Visit: Admitting: Podiatry

## 2024-01-29 NOTE — Telephone Encounter (Signed)
 He should try either Zyrtec or Xyzal (generics are available)

## 2024-01-30 NOTE — Telephone Encounter (Signed)
 Attempted to call [employer] [facility] back regarding this message no success speaking with an agents, will try again later

## 2024-02-01 NOTE — Telephone Encounter (Signed)
 Spoke with [employer] [facility] agent advised that Dr Clent Ridges does not write orders for CPAP supplies, advised to contact pt Pulmonology.

## 2024-02-07 ENCOUNTER — Ambulatory Visit: Admitting: Podiatry

## 2024-02-11 DIAGNOSIS — Z0289 Encounter for other administrative examinations: Secondary | ICD-10-CM

## 2024-02-12 ENCOUNTER — Encounter: Payer: Self-pay | Admitting: Nurse Practitioner

## 2024-02-12 ENCOUNTER — Ambulatory Visit: Admitting: Nurse Practitioner

## 2024-02-12 VITALS — BP 137/81 | HR 75 | Temp 98.2°F | Ht 69.5 in | Wt 258.0 lb

## 2024-02-12 DIAGNOSIS — E781 Pure hyperglyceridemia: Secondary | ICD-10-CM | POA: Diagnosis not present

## 2024-02-12 DIAGNOSIS — G4733 Obstructive sleep apnea (adult) (pediatric): Secondary | ICD-10-CM

## 2024-02-12 DIAGNOSIS — E66812 Obesity, class 2: Secondary | ICD-10-CM

## 2024-02-12 DIAGNOSIS — R7303 Prediabetes: Secondary | ICD-10-CM | POA: Diagnosis not present

## 2024-02-12 DIAGNOSIS — Z6837 Body mass index (BMI) 37.0-37.9, adult: Secondary | ICD-10-CM

## 2024-02-12 DIAGNOSIS — I10 Essential (primary) hypertension: Secondary | ICD-10-CM | POA: Diagnosis not present

## 2024-02-12 NOTE — Progress Notes (Addendum)
 Office: [phone]  /  Fax: [phone]   Initial Visit  Andrew Sharp was seen in clinic today to evaluate for obesity. He is interested in losing weight to improve overall health and reduce the risk of weight related complications. He presents today to review program treatment options, initial physical assessment, and evaluation.     He was referred by: PCP  When asked what else they would like to accomplish? He states: Adopt healthier eating patterns, Improve existing medical conditions, Improve quality of life, Improve appearance, and Improve self-confidence   When asked how has your weight affected you? He states: Contributed to orthopedic problems or mobility issues, Having fatigue, and Having poor endurance  Some associated conditions: Hypertension, sleep apnea on CPAP, GERD, depression, anxiety, history of esophageal stricture, hypokalemia, kidney stones, pre diabetes, high triglycerides (notes wasn't fasting with last labs)  Contributing factors: Moderate to high levels of stress  Weight promoting medications identified: None  Current nutrition plan: None  Current level of physical activity: None  Current or previous pharmacotherapy: Phentermine  + topamax   Response to medication: unsure   Past medical history includes:   Past Medical History:  Diagnosis Date   Esophageal stricture    GERD (gastroesophageal reflux disease)    History of kidney stones    Hypertension    Sleep apnea      Objective:   BP 137/81   Pulse 75   Temp 98.2 F (36.8 C)   Ht 5' 9.5" (1.765 m)   Wt 258 lb (117 kg)   SpO2 97%   BMI 37.55 kg/m  He was weighed on the bioimpedance scale: Body mass index is 37.55 kg/m.  Peak Weight:265 lbs , Body Fat%:36.23%, Visceral Fat Rating:21, Weight trend over the last 12 months: Increasing  General:  Alert, oriented and cooperative. Patient is in no acute distress.  Respiratory: Normal respiratory effort, no problems with respiration  noted   Gait: able to ambulate independently  Mental Status: Normal mood and affect. Normal behavior. Normal judgment and thought content.   DIAGNOSTIC DATA REVIEWED:  BMET    Component Value Date/Time   NA 138 12/14/2023 1443   NA 142 01/26/2017 0852   K 4.1 12/14/2023 1443   K 3.3 (L) 01/26/2017 0852   CL 103 12/14/2023 1443   CO2 28 12/14/2023 1443   CO2 28 01/26/2017 0852   GLUCOSE 85 12/14/2023 1443   GLUCOSE 115 01/26/2017 0852   BUN 14 12/14/2023 1443   BUN 10.0 01/26/2017 0852   CREATININE 1.48 12/14/2023 1443   CREATININE 1.0 01/26/2017 0852   CALCIUM 9.3 12/14/2023 1443   CALCIUM 9.1 01/26/2017 0852   GFRNONAA >60 01/25/2021 1446   GFRAA >60 10/13/2019 1447   Lab Results  Component Value Date   HGBA1C 6.4 12/14/2023   HGBA1C 5.4 11/07/2016   No results found for: "INSULIN " CBC    Component Value Date/Time   WBC 10.1 12/14/2023 1443   RBC 4.30 12/14/2023 1443   HGB 14.0 12/14/2023 1443   HGB 14.9 01/26/2017 0852   HCT 40.4 12/14/2023 1443   HCT 42.1 01/26/2017 0852   PLT 255.0 12/14/2023 1443   PLT 194 01/26/2017 0852   MCV 94.1 12/14/2023 1443   MCV 89.3 01/26/2017 0852   MCH 31.7 01/25/2021 1446   MCHC 34.6 12/14/2023 1443   RDW 13.2 12/14/2023 1443   RDW 12.9 01/26/2017 0852   Iron/TIBC/Ferritin/ %Sat No results found for: "IRON", "TIBC", "FERRITIN", "IRONPCTSAT" Lipid Panel     Component  Value Date/Time   CHOL 172 12/14/2023 1443   TRIG (H) 12/14/2023 1443    615.0 Triglyceride is over 400; calculations on Lipids are invalid.   HDL 27.50 (L) 12/14/2023 1443   CHOLHDL 6 12/14/2023 1443   VLDL 123.0 (H) 12/14/2023 1443   LDLCALC 94 08/10/2015 0856   LDLDIRECT 67.0 12/14/2023 1443   Hepatic Function Panel     Component Value Date/Time   PROT 7.6 12/14/2023 1443   PROT 7.3 01/26/2017 0852   ALBUMIN 4.3 12/14/2023 1443   ALBUMIN 4.0 01/26/2017 0852   AST 21 12/14/2023 1443   AST 26 01/26/2017 0852   ALT 21 12/14/2023 1443   ALT 32  01/26/2017 0852   ALKPHOS 63 12/14/2023 1443   ALKPHOS 66 01/26/2017 0852   BILITOT 0.3 12/14/2023 1443   BILITOT 0.45 01/26/2017 0852   BILIDIR 0.1 12/14/2023 1443      Component Value Date/Time   TSH 2.65 12/14/2023 1443     Assessment and Plan:   Essential hypertension Continue follow-up with PCP.  Continue medications as directed.  Pre-diabetes Will recheck fasting labs at next visit.  High triglycerides We will recheck fasting labs at next visit.  Obstructive sleep apnea syndrome Continue CPAP nightly  Class 2 severe obesity due to excess calories with serious comorbidity and body mass index (BMI) of 37.0 to 37.9 in adult [facility])        Obesity Treatment / Action Plan:  Patient will work on garnering support from family and friends to begin weight loss journey. Will work on eliminating or reducing the presence of highly palatable, calorie dense foods in the home. Will complete provided nutritional and psychosocial assessment questionnaire before the next appointment. Will be scheduled for indirect calorimetry to determine resting energy expenditure in a fasting state.  This will allow us  to create a reduced calorie, high-protein meal plan to promote loss of fat mass while preserving muscle mass. Counseled on the health benefits of losing 5%-15% of total body weight. Was counseled on nutritional approaches to weight loss and benefits of reducing processed foods and consuming plant-based foods and high quality protein as part of nutritional weight management. Was counseled on pharmacotherapy and role as an adjunct in weight management.   Obesity Education Performed Today:  He was weighed on the bioimpedance scale and results were discussed and documented in the synopsis.  We discussed obesity as a disease and the importance of a more detailed evaluation of all the factors contributing to the disease.  We discussed the importance of long term lifestyle changes which  include nutrition, exercise and behavioral modifications as well as the importance of customizing this to his specific health and social needs.  We discussed the benefits of reaching a healthier weight to alleviate the symptoms of existing conditions and reduce the risks of the biomechanical, metabolic and psychological effects of obesity.  Andrew Sharp appears to be in the action stage of change and states they are ready to start intensive lifestyle modifications and behavioral modifications.  30 minutes was spent today on this visit including the above counseling, pre-visit chart review, and post-visit documentation.  Reviewed by clinician on day of visit: allergies, medications, problem list, medical history, surgical history, family history, social history, and previous encounter notes pertinent to obesity diagnosis.    Crist Dominion Lyrik Dockstader FNP-C

## 2024-02-13 ENCOUNTER — Ambulatory Visit: Admitting: Bariatrics

## 2024-02-13 ENCOUNTER — Encounter: Payer: Self-pay | Admitting: Bariatrics

## 2024-02-13 VITALS — BP 121/76 | HR 65 | Temp 97.7°F | Ht 69.5 in | Wt 259.0 lb

## 2024-02-13 DIAGNOSIS — E538 Deficiency of other specified B group vitamins: Secondary | ICD-10-CM

## 2024-02-13 DIAGNOSIS — R5383 Other fatigue: Secondary | ICD-10-CM | POA: Diagnosis not present

## 2024-02-13 DIAGNOSIS — Z0000 Encounter for general adult medical examination without abnormal findings: Secondary | ICD-10-CM

## 2024-02-13 DIAGNOSIS — I10 Essential (primary) hypertension: Secondary | ICD-10-CM | POA: Diagnosis not present

## 2024-02-13 DIAGNOSIS — R0602 Shortness of breath: Secondary | ICD-10-CM | POA: Diagnosis not present

## 2024-02-13 DIAGNOSIS — G4733 Obstructive sleep apnea (adult) (pediatric): Secondary | ICD-10-CM

## 2024-02-13 DIAGNOSIS — E669 Obesity, unspecified: Secondary | ICD-10-CM

## 2024-02-13 DIAGNOSIS — E781 Pure hyperglyceridemia: Secondary | ICD-10-CM | POA: Diagnosis not present

## 2024-02-13 DIAGNOSIS — Z1331 Encounter for screening for depression: Secondary | ICD-10-CM

## 2024-02-13 DIAGNOSIS — E559 Vitamin D deficiency, unspecified: Secondary | ICD-10-CM

## 2024-02-13 DIAGNOSIS — R7303 Prediabetes: Secondary | ICD-10-CM

## 2024-02-13 DIAGNOSIS — E66812 Obesity, class 2: Secondary | ICD-10-CM

## 2024-02-13 DIAGNOSIS — Z6837 Body mass index (BMI) 37.0-37.9, adult: Secondary | ICD-10-CM

## 2024-02-13 NOTE — Progress Notes (Signed)
 At a Glance:  Vitals Temp: 98.2 F (36.8 C) BP: 137/81 Pulse Rate: 75 SpO2: 97 %   Anthropometric Measurements Height: 5' 9.5" (1.765 m) Weight: 258 lb (117 kg) BMI (Calculated): 37.57 Peak Weight: 265lb   Body Composition  Body Fat %: 36.23 % Fat Mass (lbs): 93.4 lbs Muscle Mass (lbs): 156.6 lbs Total Body Water (lbs): 122.4 lbs Visceral Fat Rating : 21   Other Clinical Data Today's Visit #: Info Session    EKG: Normal sinus rhythm, rate 66. Mild changes probably related to body habitus.   Indirect Calorimeter:   Resting Metabolic Rate ( RMR):  RMR (actual): 2261 kcal RMR (calculated): 2270 kcal The calculated basal metabolic rate is 1610 thus his basal metabolic rate is worse than expected.  Plan:   Indirect calorimeter completed, interpreted and reviewed with patient today and allowed to ask questions.  Discussed the implications for the chosen plan and exercise based on the RMR reading.  Will consider repeating the RMR in the future based on weight loss.    Chief Complaint:  Obesity   Subjective:  Andrew Sharp (MR# [id number]) is an 53 y.o. male who presents for evaluation and treatment of obesity and related comorbidities.   Andrew Sharp is currently in the action stage of change and ready to dedicate time achieving and maintaining a healthier weight. Andrew Sharp is interested in becoming our patient and working on intensive lifestyle modifications including (but not limited to) diet and exercise for weight loss.  Andrew Sharp has been struggling with his weight. He has been unsuccessful in either losing weight, maintaining weight loss, or reaching his healthy weight goal.  Andrew Sharp's habits were reviewed today and are as follows: His family eats meals together, he thinks his family will eat healthier with him, he started gaining weight in 2009 after knee surgery, his heaviest weight ever was 265 pounds, he is a picky eater and doesn't like to eat healthier  foods, he snacks frequently in the evenings, he skips meals frequently, he frequently makes poor food choices, and he struggles with emotional eating.  Current or previous pharmacotherapy: Phentermine   Response to medication: Ineffective so it was discontinued  Other Fatigue Andrew Sharp admits to daytime somnolence and admits to waking up still tired. Patient has a history of symptoms of daytime fatigue. Andrew Sharp generally gets 7 or 8 hours of sleep per night, and states that he has difficulty falling back asleep if awakened. Snoring is present. Apneic episodes are present. Epworth Sleepiness Score is 18.   Shortness of Breath Andrew Sharp notes increasing shortness of breath with exercising and seems to be worsening over time with weight gain. He notes getting out of breath sooner with activity than he used to. This has gotten worse recently. Andrew Sharp denies shortness of breath at rest or orthopnea.  Depression Screen Andrew Sharp's Food and Mood (modified PHQ-9) score was 16. 15-19 moderate severe depression     08/18/2022   10:59 AM  Depression screen PHQ 2/9  Decreased Interest 0  Down, Depressed, Hopeless 0  PHQ - 2 Score 0  Altered sleeping 0  Tired, decreased energy 0  Change in appetite 0  Feeling bad or failure about yourself  0  Trouble concentrating 0  Moving slowly or fidgety/restless 0  Suicidal thoughts 0  PHQ-9 Score 0  Difficult doing work/chores Not difficult at all     Assessment and Plan:   Other Fatigue Andrew Sharp does feel that his weight is causing his energy to be lower than it should  be. Fatigue may be related to obesity, depression or many other causes. Labs will be ordered, and in the meanwhile, Andrew Sharp will focus on self care including making healthy food choices, increasing physical activity and focusing on stress reduction.  Shortness of Breath Andrew Sharp does not feel that he gets out of breath more easily that he used to when he exercises. Andrew Sharp's shortness of  breath appears to be obesity related and exercise induced. He has agreed to work on weight loss and gradually increase exercise to treat his exercise induced shortness of breath. Will continue to monitor closely.  Health Maintenance:   Obesity   Plan: Will do EKG, indirect calorimetry, and labs.     Vitamin D  Deficiency He is at risk for vitamin D  deficiency due to obesity.  He is on MV.  Lab Results  Component Value Date   VD25OH 26 (L) 07/22/2012    Plan: Will check for vitamin D  deficiency.   Hypertriglyceridemia:  Triglycerides are very high at 615 in 02/25.  LDL is at goal. Medication(s): No medications  Cardiovascular risk factors: dyslipidemia, family history of premature cardiovascular disease, hypertension, male gender, obesity (BMI >= 30 kg/m2), and sedentary lifestyle  Lab Results  Component Value Date   CHOL 172 12/14/2023   HDL 27.50 (L) 12/14/2023   LDLCALC 94 08/10/2015   LDLDIRECT 67.0 12/14/2023   TRIG (H) 12/14/2023    615.0 Triglyceride is over 400; calculations on Lipids are invalid.   CHOLHDL 6 12/14/2023   Lab Results  Component Value Date   ALT 21 12/14/2023   AST 21 12/14/2023   ALKPHOS 63 12/14/2023   BILITOT 0.3 12/14/2023   The 10-year ASCVD risk score (Arnett DK, et al., 2019) is: 10.3%   Values used to calculate the score:     Age: 89 years     Sex: Male     Is Non-Hispanic African American: Yes     Diabetic: No     Tobacco smoker: No     Systolic Blood Pressure: 121 mmHg     Is BP treated: Yes     HDL Cholesterol: 27.5 mg/dL     Total Cholesterol: 172 mg/dL  Plan:  Continue statin.  Information sheet on healthy vs unhealthy fats.  Will avoid all trans fats.  Will read labels Will minimize saturated fats except the following: low fat meats in moderation, diary, and limited dark chocolate.  Increase Omega 3 in foods, and consider an Omega 3 supplement.  Will minimize his carbohydrates, both sweets, and starches.     Obstructive Sleep Apnea Andrew Sharp has a diagnosis of sleep apnea. He reports that he is using a CPAP regularly. Reports restful sleep.   Plan: Continue CPAP therapy. Continue to practice good sleep hygiene.  Will add in elements of an antiinflammatory diet and  add in elements of a Mediterranean diet.  Reduce intake of carbohydrates for weight loss.  Will consider Zepbound for OSA after his triglycerides come down.   Hypertension Hypertension stable.  Medication(s): Amlodipine  10 mg 1 daily , Cozaar  100 mg once daily , and metoprolol  100 mg XL  daily   BP Readings from Last 3 Encounters:  02/13/24 121/76  02/12/24 137/81  12/14/23 118/70   Lab Results  Component Value Date   CREATININE 1.48 12/14/2023   CREATININE 1.65 (H) 03/13/2022   CREATININE 1.56 (H) 02/20/2022   Lab Results  Component Value Date   GFR 53.90 (L) 12/14/2023   GFR 47.89 (L) 03/13/2022   GFR  51.24 (L) 02/20/2022    Plan: Continue all antihypertensives at current dosages. No added salt. Will keep sodium content to 1,500 mg or less per day.    Prediabetes Last A1c was 6.4  Medication: Was prescribed Ozempic  0.25 per his PCP but insurance would not cover.   Lab Results  Component Value Date   HGBA1C 6.4 12/14/2023   HGBA1C 5.8 02/20/2022   HGBA1C 5.6 01/17/2021   HGBA1C 5.5 04/23/2019   HGBA1C 5.5 09/16/2018   No results found for: "INSULIN "  Plan: Discussed insulin  Resistance and prediabetes Will minimize all refined carbohydrates both sweets and starches.  Will work on the plan and exercise.  Consider both aerobic and resistance training.  Will keep protein, water, and fiber intake high.  Increase Polyunsaturated and Monounsaturated fats to increase satiety and encourage weight loss.  Aim for 7 to 9 hours of sleep nightly.  No medications at this time, but will work on the plan and exercise.   B 12 deficiency:   He is not taking B12.   He has risk for B12 deficiency secondary to diet .    Plan:  Check B 12 lab today.   Andrew Sharp had a positive depression screening. Depression is commonly associated with obesity and often results in emotional eating behaviors. We will monitor this closely and work on CBT to help improve the non-hunger eating patterns. Referral to Psychology may be required if no improvement is seen as he continues in our clinic.   Previous labs reviewed today. Date: 12/14/23 CMP, Lipids, HgbA1c, and CBC   Labs done today CMP, Lipids, Insulin , Vit D, Vit B12, and Thyroid  Panel   Generalized Obesity: BMI (Calculated): 37.57   Andrew Sharp is currently in the action stage of change and his goal is to begin weight loss efforts. I recommend Andrew Sharp begin the structured treatment plan as follows:  He has agreed to Category 3 Plan  Exercise goals: All adults should avoid inactivity. Some activity is better than none, and adults who participate in any amount of physical activity, gain some health benefits. He is having lower back pain which limits his activities.   Behavioral modification strategies:increasing lean protein intake, increasing vegetables, increase H2O intake, increase high fiber foods, decreasing eating out, no skipping meals, meal planning and cooking strategies, keeping healthy foods in the home, better snacking choices, avoiding temptations, and planning for success  He was informed of the importance of frequent follow-up visits to maximize his success with intensive lifestyle modifications for his multiple health conditions. He was informed we would discuss his lab results at his next visit unless there is a critical issue that needs to be addressed sooner. Andrew Sharp agreed to keep his next visit at the agreed upon time to discuss these results.  Objective:  General: Cooperative, alert, well developed, in no acute distress. HEENT: Conjunctivae and lids unremarkable. Cardiovascular: Regular rhythm.  Lungs: Normal work of breathing. Neurologic: No  focal deficits.   Lab Results  Component Value Date   CREATININE 1.48 12/14/2023   BUN 14 12/14/2023   NA 138 12/14/2023   K 4.1 12/14/2023   CL 103 12/14/2023   CO2 28 12/14/2023   Lab Results  Component Value Date   ALT 21 12/14/2023   AST 21 12/14/2023   ALKPHOS 63 12/14/2023   BILITOT 0.3 12/14/2023   Lab Results  Component Value Date   HGBA1C 6.4 12/14/2023   HGBA1C 5.8 02/20/2022   HGBA1C 5.6 01/17/2021   HGBA1C 5.5 04/23/2019  HGBA1C 5.5 09/16/2018   No results found for: "INSULIN " Lab Results  Component Value Date   TSH 2.65 12/14/2023   Lab Results  Component Value Date   CHOL 172 12/14/2023   HDL 27.50 (L) 12/14/2023   LDLCALC 94 08/10/2015   LDLDIRECT 67.0 12/14/2023   TRIG (H) 12/14/2023    615.0 Triglyceride is over 400; calculations on Lipids are invalid.   CHOLHDL 6 12/14/2023   Lab Results  Component Value Date   WBC 10.1 12/14/2023   HGB 14.0 12/14/2023   HCT 40.4 12/14/2023   MCV 94.1 12/14/2023   PLT 255.0 12/14/2023   No results found for: "IRON", "TIBC", "FERRITIN"  Attestation Statements:  Applicable history such as the following:  allergies, medications, problem list, medical history, surgical history, family history, social history, and previous encounter notes reviewed by clinician on day of visit:  Time spent on visit in care of the patient today including the items listed below was 44 minutes.    20 minutes were spent talking about the history,  15  minutes for face to face counseling implementing the plan, discussing the specifics of how to arrange meals, meal planning, water intake.   I spent face to face time discussing his/her plan, including breakfast, additional breakfast options, lunch, and dinner options, grocery list, and snacks.  I reviewed his indirect calorimetry. I discussed the implications for the diet plan.    Discussed the bio-impedence test (fat %, muscle mass, and water weight) and allowed the patient to  ask questions.   Discussed the following information sheets: Category 3.   I reviewed the labs which were ordered from his/her visit on 12/14/2023.   I additionally spent time documenting, reviewing, and checking the codes before submitting.   This may have been prepared with the assistance of Engineer, civil (consulting).  Occasional wrong-word or sound-a-like substitutions may have occurred due to the inherent limitations of voice recognition software.    Kirk Peper, DO

## 2024-02-14 ENCOUNTER — Encounter: Payer: Self-pay | Admitting: Bariatrics

## 2024-02-14 LAB — COMPREHENSIVE METABOLIC PANEL WITH GFR
ALT: 24 IU/L (ref 0–44)
AST: 22 IU/L (ref 0–40)
Albumin: 4.4 g/dL (ref 3.8–4.9)
Alkaline Phosphatase: 74 IU/L (ref 44–121)
BUN/Creatinine Ratio: 11 (ref 9–20)
BUN: 17 mg/dL (ref 6–24)
Bilirubin Total: 0.2 mg/dL (ref 0.0–1.2)
CO2: 23 mmol/L (ref 20–29)
Calcium: 9.8 mg/dL (ref 8.7–10.2)
Chloride: 103 mmol/L (ref 96–106)
Creatinine, Ser: 1.5 mg/dL — ABNORMAL HIGH (ref 0.76–1.27)
Globulin, Total: 3.3 g/dL (ref 1.5–4.5)
Glucose: 113 mg/dL — ABNORMAL HIGH (ref 70–99)
Potassium: 4.6 mmol/L (ref 3.5–5.2)
Sodium: 140 mmol/L (ref 134–144)
Total Protein: 7.7 g/dL (ref 6.0–8.5)
eGFR: 55 mL/min/{1.73_m2} — ABNORMAL LOW (ref >59)

## 2024-02-14 LAB — LIPID PANEL WITH LDL/HDL RATIO
Cholesterol, Total: 173 mg/dL (ref 100–199)
HDL: 25 mg/dL — ABNORMAL LOW (ref >39)
LDL Chol Calc (NIH): 94 mg/dL (ref 0–99)
LDL/HDL Ratio: 3.8 ratio — ABNORMAL HIGH (ref 0.0–3.6)
Triglycerides: 319 mg/dL — ABNORMAL HIGH (ref 0–149)
VLDL Cholesterol Cal: 54 mg/dL — ABNORMAL HIGH (ref 5–40)

## 2024-02-14 LAB — INSULIN, RANDOM: INSULIN: 49.2 u[IU]/mL — ABNORMAL HIGH (ref 2.6–24.9)

## 2024-02-14 LAB — TSH+T4F+T3FREE
Free T4: 1.14 ng/dL (ref 0.82–1.77)
T3, Free: 3.3 pg/mL (ref 2.0–4.4)
TSH: 1.94 u[IU]/mL (ref 0.450–4.500)

## 2024-02-14 LAB — VITAMIN B12: Vitamin B-12: 1608 pg/mL — ABNORMAL HIGH (ref 232–1245)

## 2024-02-14 LAB — VITAMIN D 25 HYDROXY (VIT D DEFICIENCY, FRACTURES): Vit D, 25-Hydroxy: 85.1 ng/mL (ref 30.0–100.0)

## 2024-02-17 ENCOUNTER — Other Ambulatory Visit: Payer: Self-pay | Admitting: Orthopedic Surgery

## 2024-02-18 ENCOUNTER — Encounter: Payer: Self-pay | Admitting: Bariatrics

## 2024-02-18 DIAGNOSIS — E88819 Insulin resistance, unspecified: Secondary | ICD-10-CM | POA: Insufficient documentation

## 2024-02-18 DIAGNOSIS — E781 Pure hyperglyceridemia: Secondary | ICD-10-CM | POA: Insufficient documentation

## 2024-02-18 DIAGNOSIS — E786 Lipoprotein deficiency: Secondary | ICD-10-CM | POA: Insufficient documentation

## 2024-02-21 ENCOUNTER — Ambulatory Visit: Admitting: Nurse Practitioner

## 2024-02-21 ENCOUNTER — Encounter: Payer: Self-pay | Admitting: Nurse Practitioner

## 2024-02-21 VITALS — BP 136/76 | HR 80 | Ht 69.5 in | Wt 259.5 lb

## 2024-02-21 DIAGNOSIS — K5900 Constipation, unspecified: Secondary | ICD-10-CM

## 2024-02-21 DIAGNOSIS — K219 Gastro-esophageal reflux disease without esophagitis: Secondary | ICD-10-CM

## 2024-02-21 DIAGNOSIS — K625 Hemorrhage of anus and rectum: Secondary | ICD-10-CM | POA: Diagnosis not present

## 2024-02-21 DIAGNOSIS — Z860101 Personal history of adenomatous and serrated colon polyps: Secondary | ICD-10-CM

## 2024-02-21 DIAGNOSIS — R110 Nausea: Secondary | ICD-10-CM

## 2024-02-21 DIAGNOSIS — K648 Other hemorrhoids: Secondary | ICD-10-CM | POA: Diagnosis not present

## 2024-02-21 MED ORDER — BACLOFEN 10 MG PO TABS: 10.0000 mg | ORAL_TABLET | Freq: Every day | ORAL | 3 refills | Status: DC

## 2024-02-21 NOTE — Progress Notes (Signed)
 02/21/2024 Andrew Sharp [id number] 1971-06-26   Chief Complaint: Dysphagia   History of Present Illness:  Andrew Sharp is a 53 year old male with a past medical history of kidney stones, hypertension, GERD and colon polyps. He is followed by Dr. General Kenner. He presents today for GERD follow-up and to refill his baclofen  prescription. As long as he takes Omeprazole  20 mg twice daily and Baclofen  nightly he does not experience any further nighttime reflux symptoms.  No dysphagia. He endorses having mild nausea which occurs in the afternoon 2 days weekly which lasts for 3 to 4 hours over the past 3 to 4 months without vomiting. He takes Dramamine OTC and turns on his AC to feel cooler and his nausea abates. He started taking a multivitamin in the morning a few months ago otherwise no other new supplements or new medications. He also endorses having constipation for the past 6 months, passes hard stools every other day and intermittently sees a small amount of bright red blood on stool and most days he sees a small drop of blood on the toilet tissue.  He is taking Metamucil every other day, if he takes it daily his nausea worsens.  He drinks less than 64 ounces of water daily. Prior EGD 02/05/2023 showed a large gastric polyp which was not removed and a repeat EGD in the hospital setting was scheduled. S/P follow EGD at [facility] 04/09/2023 which showed a 1 cm hiatal hernia and a 12mm hyperplastic gastric polyp was removed. His most recent colonoscopy 02/05/2023 identified 1 tubular adenomatous polyp removed from the cecum and diverticulosis in the sigmoid colon.  He was advised to repeat a colonoscopy in 7 years.       Latest Ref Rng & Units 12/14/2023    2:43 PM 02/20/2022   10:29 AM 07/19/2021    3:40 PM  CBC  WBC 4.0 - 10.5 K/uL 10.1  8.2  9.0   Hemoglobin 13.0 - 17.0 g/dL 24.4  01.0  27.2   Hematocrit 39.0 - 52.0 % 40.4  39.7  39.6   Platelets 150.0 - 400.0 K/uL 255.0  231.0  232.0         Latest Ref Rng & Units 02/13/2024    9:21 AM 12/14/2023    2:43 PM 03/13/2022    3:10 PM  CMP  Glucose 70 - 99 mg/dL 536  85  83   BUN 6 - 24 mg/dL 17  14  15    Creatinine 0.76 - 1.27 mg/dL 6.44  0.34  7.42   Sodium 134 - 144 mmol/L 140  138  137   Potassium 3.5 - 5.2 mmol/L 4.6  4.1  4.1   Chloride 96 - 106 mmol/L 103  103  105   CO2 20 - 29 mmol/L 23  28  25    Calcium 8.7 - 10.2 mg/dL 9.8  9.3  9.5   Total Protein 6.0 - 8.5 g/dL 7.7  7.6    Total Bilirubin 0.0 - 1.2 mg/dL 0.2  0.3    Alkaline Phos 44 - 121 IU/L 74  63    AST 0 - 40 IU/L 22  21    ALT 0 - 44 IU/L 24  21      EGD 04/09/2023: - Esophagogastric landmarks identified.  - 1 cm hiatal hernia.  - A single gastric polyp. Resected and retrieved. Injected. Clips were placed.  - A few gastric polyps, benign appearing.  - Normal stomach otherwise.  -  Normal examined duodenum. A. STOMACH, POLYPECTOMY:  Hyperplastic polyp with surface erosion  Chronic active gastritis  Helicobacter stain negative ([facility], adequate control)  Negative for intestinal metaplasia, dysplasia and carcinoma   EGD 02/05/2023: - 1 cm hiatal hernia.  - Normal esophagus otherwise - empiric dilation performed to 17mm and biopsies obtained to rule out EoE.  - A few gastric polyps. Likely benign inflammatory, one large, as outlined above, not removed today.  - Normal examined duodenum.  Colonoscopy 02/05/2023: - One 5 mm polyp in the cecum, removed with a cold snare. Resected and retrieved.  - Diverticulosis in the sigmoid colon.  - Internal hemorrhoids.  - The examination was otherwise normal.  - The GI Genius (intelligent endoscopy module), computer-aided polyp detection system powered by AI was utilized to detect colorectal polyps through enhanced visualization during colonoscopy. - 7 year recall colonoscopy   1. Surgical [P], random esophageal - ESOPHAGEAL SQUAMOUS MUCOSA WITH MILD VASCULAR CONGESTION, AND FOCAL SQUAMOUS BALLOONING, SUGGESTIVE OF  REFLUX ESOPHAGITIS - NEGATIVE FOR INCREASED INTRAEPITHELIAL EOSINOPHILS 2. Surgical [P], colon, cecum, polyp (1) - TUBULAR ADENOMA - NEGATIVE FOR HIGH-GRADE DYSPLASIA OR MALIGNANCY Andrew Sharp  EGD 05/26/15 - normal exam EGD 02/06/12 - ? GEJ mild ring, dilated to 19mm Sav[place], he reported benefit Colonoscopy 03/18/06 - mild rectal erythema with nonspecific inflammation, hemorrhoids Colonoscopy 03/27/16 - sigmoid diverticulosis, internal hemorrhoids, 4mm cecal polyp - TA - recall 03/2021    Past Medical History:  Diagnosis Date   Anxiety    Esophageal stricture    Fatty liver    GERD (gastroesophageal reflux disease)    High cholesterol    History of kidney stones    Hypertension    Joint pain    Pre-diabetes    Sleep apnea    Swelling of lower extremity    Past Surgical History:  Procedure Laterality Date   COLONOSCOPY  02/05/2023   per Dr. General Kenner, tubular adenoma, repeat in 7 yrs   CT CORONARY CA SCORING  09/19/2011   for chest pains, negative    ESOPHAGOGASTRODUODENOSCOPY (EGD) WITH PROPOFOL  N/A 04/09/2023   per Dr. General Kenner, hyperplastic polyp, no follow up needed   HEMOSTASIS CLIP PLACEMENT  04/09/2023   Procedure: HEMOSTASIS CLIP PLACEMENT;  Surgeon: Ace Holder, MD;  Location: [facility];  Service: Gastroenterology;;   KNEE ARTHROPLASTY Left    POLYPECTOMY  04/09/2023   Procedure: POLYPECTOMY;  Surgeon: Ace Holder, MD;  Location: [facility];  Service: Gastroenterology;;   Daryle Eon  04/09/2023   Procedure: Daryle Eon;  Surgeon: Ace Holder, MD;  Location: [facility];  Service: Gastroenterology;;   Current Outpatient Medications on File Prior to Visit  Medication Sig Dispense Refill   amLODipine  (NORVASC ) 10 MG tablet TAKE 1 TABLET(10 MG) BY MOUTH DAILY 90 tablet 3   buPROPion  (WELLBUTRIN  XL) 150 MG 24 hr tablet Take 1 tablet (150 mg total) by mouth daily. 90 tablet 3   cloNIDine  (CATAPRES ) 0.1 MG tablet TAKE 1 TABLET(0.1 MG) BY  MOUTH TWICE DAILY 60 tablet 2   hydrALAZINE  (APRESOLINE ) 100 MG tablet Take 1 tablet (100 mg total) by mouth 3 (three) times daily. 270 tablet 3   losartan  (COZAAR ) 100 MG tablet Take 1 tablet (100 mg total) by mouth daily. 90 tablet 3   metoprolol  succinate (TOPROL -XL) 100 MG 24 hr tablet Take 1 tablet (100 mg total) by mouth daily. Take with or immediately following a meal. 90 tablet 3   modafinil  (PROVIGIL ) 200 MG tablet TAKE 1 TABLET(200 MG) BY MOUTH DAILY  30 tablet 2   omeprazole  (PRILOSEC) 20 MG capsule TAKE 1 CAPSULE(20 MG) BY MOUTH IN THE MORNING AND AT BEDTIME 180 capsule 0   OVER THE COUNTER MEDICATION Take 3 capsules by mouth daily. Beet Root     pregabalin  (LYRICA ) 75 MG capsule TAKE 1 CAPSULE(75 MG) BY MOUTH TWICE DAILY 60 capsule 2   spironolactone  (ALDACTONE ) 50 MG tablet Take 1 tablet (50 mg total) by mouth daily. 90 tablet 3   No current facility-administered medications on file prior to visit.   No Known Allergies  Current Medications, Allergies, Past Medical History, Past Surgical History, Family History and Social History were reviewed in [employer] record.  Review of Systems:   Constitutional: Negative for fever, sweats, chills or weight loss.  Respiratory: Negative for shortness of breath.   Cardiovascular: Negative for chest pain, palpitations and leg swelling.  Gastrointestinal: See HPI.  Musculoskeletal: Negative for back pain or muscle aches.  Neurological: Negative for dizziness, headaches or paresthesias.   Physical Exam: BP 136/76   Pulse 80   Ht 5' 9.5" (1.765 m)   Wt 259 lb 8 oz (117.7 kg)   SpO2 99%   BMI 37.77 kg/m   General: 53 year old male in no acute distress. Head: Normocephalic and atraumatic. Eyes: No scleral icterus. Conjunctiva pink . Ears: Normal auditory acuity. Mouth: Dentition intact. No ulcers or lesions.  Lungs: Clear throughout to auscultation. Heart: Regular rate and rhythm, no murmur. Abdomen: Soft,  nontender and nondistended. No masses or hepatomegaly. Normal bowel sounds x 4 quadrants.  Rectal: Patient declined rectal exam. Musculoskeletal: Symmetrical with no gross deformities. Extremities: No edema. Neurological: Alert oriented x 4. No focal deficits.  Psychological: Alert and cooperative. Normal mood and affect  Assessment and Recommendations:  53 year old male with a history of GERD and a hyperplastic gastric polyp.  GERD symptoms well-controlled on Omeprazole  20 mg twice daily and baclofen  nightly. EGD 02/05/2023 showed a large gastric polyp which was not removed and a repeat EGD in the hospital setting was scheduled. S/P follow EGD at [facility] 04/09/2023 which showed a 1 cm hiatal hernia and a 12mm hyperplastic gastric polyp.  - Continue Omeprazole  20 mg twice daily and Baclofen  10mg  po nightly - GERD diet - Weight loss recommended, continue follow-up at [facility] center  History of colon polyps. His most recent colonoscopy 02/05/2023 identified 1 tubular adenomatous polyp removed from the cecum. - Next colon polyp surveillance colonoscopy due 01/2030  Constipation x 6 months - MiraLAX nightly - Continue Metamucil every other day as tolerated - Drink 64 ounces of water daily - Dietary fiber as tolerated  History of hemorrhoids with chronic rectal bleeding.  Colonoscopy 02/05/2023 identified internal hemorrhoids, diverticulosis and 1 tubular adenoma removed from the cecum. Hg 14 on 12/14/2023. - Treat constipation as noted above - Apply a small amount of Desitin inside the anal opening and to the external anal area three times daily as needed for anal or hemorrhoidal irritation/bleeding. -Patient contact office if rectal bleeding persists or worsens  Nausea without vomiting which occurs in the afternoons 2 days weekly for the pas 3 to 4 months. Multivitamin and or constipation may be a contributing factor.  Normal LFTs 02/13/2024. - Patient to monitor nausea, verify if  symptoms improve if he has increased stool output on MiraLAX. - Consider holding multivitamin if nausea persists - Recommend RUQ sonogram if nausea persists or worsens

## 2024-02-21 NOTE — Patient Instructions (Addendum)
 Miralax- take every night  Apply a small amount of Desitin inside the anal opening and to the external anal area three times daily as needed for anal or hemorrhoidal irritation/bleeding.   Increase water intake to 8 glasses or 64 ounces daily.  Dietary fiber as needed  Due to recent changes in healthcare laws, you may see the results of your imaging and laboratory studies on MyChart before your provider has had a chance to review them.  We understand that in some cases there may be results that are confusing or concerning to you. Not all laboratory results come back in the same time frame and the provider may be waiting for multiple results in order to interpret others.  Please give us  48 hours in order for your provider to thoroughly review all the results before contacting the office for clarification of your results.   Thank you for trusting me with your gastrointestinal care!   Everett Hitt, CRNP

## 2024-02-21 NOTE — Progress Notes (Signed)
 Agree with assessment and plan as outlined.

## 2024-02-26 ENCOUNTER — Encounter: Payer: Self-pay | Admitting: Orthopedic Surgery

## 2024-02-27 ENCOUNTER — Ambulatory Visit: Admitting: Bariatrics

## 2024-02-27 ENCOUNTER — Encounter: Payer: Self-pay | Admitting: Bariatrics

## 2024-02-27 VITALS — BP 138/81 | HR 78 | Temp 98.0°F | Ht 69.5 in | Wt 253.0 lb

## 2024-02-27 DIAGNOSIS — R632 Polyphagia: Secondary | ICD-10-CM | POA: Diagnosis not present

## 2024-02-27 DIAGNOSIS — R7303 Prediabetes: Secondary | ICD-10-CM | POA: Diagnosis not present

## 2024-02-27 DIAGNOSIS — E781 Pure hyperglyceridemia: Secondary | ICD-10-CM

## 2024-02-27 DIAGNOSIS — Z6836 Body mass index (BMI) 36.0-36.9, adult: Secondary | ICD-10-CM

## 2024-02-27 DIAGNOSIS — E66812 Obesity, class 2: Secondary | ICD-10-CM

## 2024-02-27 DIAGNOSIS — E669 Obesity, unspecified: Secondary | ICD-10-CM | POA: Diagnosis not present

## 2024-02-27 MED ORDER — FENOFIBRATE 145 MG PO TABS: 145.0000 mg | ORAL_TABLET | Freq: Every day | ORAL | 0 refills | Status: DC

## 2024-02-27 MED ORDER — WEGOVY 0.25 MG/0.5ML ~~LOC~~ SOAJ: 0.2500 mg | SUBCUTANEOUS | 0 refills | Status: DC

## 2024-02-27 NOTE — Progress Notes (Signed)
 WEIGHT SUMMARY AND BIOMETRICS  Weight Lost Since Last Visit: 6lb  Weight Gained Since Last Visit: 0   Vitals Temp: 98 F (36.7 C) BP: 138/81 Pulse Rate: 78 SpO2: 97 %   Anthropometric Measurements Height: 5' 9.5" (1.765 m) Weight: 253 lb (114.8 kg) BMI (Calculated): 36.84 Weight at Last Visit: 259lb Weight Lost Since Last Visit: 6lb Weight Gained Since Last Visit: 0 Starting Weight: 259lb Total Weight Loss (lbs): 6 lb (2.722 kg)   Body Composition  Body Fat %: 35.1 % Fat Mass (lbs): 88.8 lbs Muscle Mass (lbs): 156.4 lbs Total Body Water (lbs): 120.2 lbs Visceral Fat Rating : 20   Other Clinical Data Fasting: no Labs: no Today's Visit #: 2 Starting Date: 02/13/24    OBESITY Andrew Sharp is here to discuss his progress with his obesity treatment plan along with follow-up of his obesity related diagnoses.    Nutrition Plan: the Category 3 plan - 65% adherence.  Current exercise: walking  Interim History:  He is down 6 lbs since his first visit.  Eating all of the food on the plan., Protein intake is as prescribed, Is not skipping meals, Water intake is adequate., and Reports polyphagia   Hunger is moderately controlled.  Cravings are moderately controlled.  Assessment/Plan:   Polyphagia Andrew Sharp endorses excessive hunger.  Medication(s): none Effects of medication (appetite):  moderately controlled. Cravings are moderately controlled.   Plan: Medication(s): Wegovy 0.25 mg SQ weekly Will increase water, protein and fiber to help assuage hunger.  Will minimize foods that have a high glucose index/load to minimize reactive hypoglycemia.  Andrew Sharp denies personal or family history of thyroid  cancer, history of pancreatitis, or current cholelithiasis. Andrew Sharp was informed of the most common side effects (nausea, constipation, diarrhea). Andrew Sharp was given  GLP-1 information sheet.  Patient informed to watch for possible symptoms, such as a lump or swelling in the neck, hoarseness, trouble swallowing, or shortness of breath. If you have any of these symptoms, tell your healthcare provide.   He has been placed on a 500 calorie deficit diet.  He has been advised to exercise at least 150 minutes per week, both cardio and resistance.    Prediabetes Last A1c was 6.4  Medication(s): none Lab Results  Component Value Date   HGBA1C 6.4 12/14/2023   HGBA1C 5.8 02/20/2022   HGBA1C 5.6 01/17/2021   HGBA1C 5.5 04/23/2019   HGBA1C 5.5 09/16/2018   Lab Results  Component Value Date   INSULIN  49.2 (H) 02/13/2024    Plan: Will minimize all refined carbohydrates both sweets and starches.  Will work on the plan and exercise.  Consider both aerobic and resistance training.  Will keep protein, water, and fiber intake high.  Increase Polyunsaturated and Monounsaturated fats to increase satiety and encourage weight loss.  Aim for 7 to 9 hours of sleep nightly.  Start Wegovy 0.25 mg SQ  weekly   Hypertriglyceridemia:  Triglycerides are not at goal. Medication(s): none Cardiovascular risk factors: dyslipidemia, hypertension, male gender, obesity (BMI >= 30 kg/m2), and sedentary lifestyle  Lab Results  Component Value Date   CHOL 173 02/13/2024   HDL 25 (L) 02/13/2024   LDLCALC 94 02/13/2024   LDLDIRECT 67.0 12/14/2023   TRIG 319 (H) 02/13/2024   CHOLHDL 6 12/14/2023   Lab Results  Component Value Date   ALT 24 02/13/2024   AST 22 02/13/2024   ALKPHOS 74 02/13/2024   BILITOT 0.2 02/13/2024   The 10-year ASCVD risk score (Arnett DK, et al., 2019) is: 13.5%   Values used to calculate the score:     Age: 53 years     Sex: Male     Is Non-Hispanic African American: Yes     Diabetic: No     Tobacco smoker: No     Systolic Blood Pressure: 138 mmHg     Is BP treated: Yes     HDL Cholesterol: 25 mg/dL     Total Cholesterol: 173  mg/dL  Plan:   Rx: Fenofibrate 145 mg 1 daily # 30 with no refills.  Will avoid all trans fats.  Will read labels Will minimize saturated fats except the following: low fat meats in moderation, diary, and limited dark chocolate.  Increase Omega 3 in foods, and consider an Omega 3 supplement.    Generalized Obesity: Current BMI BMI (Calculated): 36.84   Pharmacotherapy Plan Start  Wegovy 0.25 mg SQ weekly  Andrew Sharp is currently in the action stage of change. As such, his goal is to continue with weight loss efforts.  He has agreed to the Category 3 plan.  Exercise goals: All adults should avoid inactivity. Some physical activity is better than none, and adults who participate in any amount of physical activity gain some health benefits.  Behavioral modification strategies: increasing lean protein intake, no meal skipping, meal planning , increase water intake, better snacking choices, planning for success, increasing vegetables, keep healthy foods in the home, weigh protein portions, and mindful eating.  Andrew Sharp has agreed to follow-up with our clinic in 2 weeks.      Objective:   VITALS: Per patient if applicable, see vitals. GENERAL: Alert and in no acute distress. CARDIOPULMONARY: No increased WOB. Speaking in clear sentences.  PSYCH: Pleasant and cooperative. Speech normal rate and rhythm. Affect is appropriate. Insight and judgement are appropriate. Attention is focused, linear, and appropriate.  NEURO: Oriented as arrived to appointment on time with no prompting.   Attestation Statements:   This was prepared with the assistance of Engineer, civil (consulting).  Occasional wrong-word or sound-a-like substitutions may have occurred due to the inherent limitations of voice recognition   Kirk Peper, DO

## 2024-03-01 ENCOUNTER — Encounter: Payer: Self-pay | Admitting: Bariatrics

## 2024-03-03 ENCOUNTER — Encounter (INDEPENDENT_AMBULATORY_CARE_PROVIDER_SITE_OTHER): Payer: Self-pay

## 2024-03-03 ENCOUNTER — Telehealth (INDEPENDENT_AMBULATORY_CARE_PROVIDER_SITE_OTHER): Payer: Self-pay

## 2024-03-03 NOTE — Telephone Encounter (Signed)
 PA - Wegovy  - 0.25 mg started

## 2024-03-03 NOTE — Telephone Encounter (Signed)
 PA - Wegovy  denied, drug not covered by plan, sent pt a messge

## 2024-03-10 ENCOUNTER — Ambulatory Visit: Admitting: Orthopedic Surgery

## 2024-03-10 ENCOUNTER — Other Ambulatory Visit (INDEPENDENT_AMBULATORY_CARE_PROVIDER_SITE_OTHER): Payer: Self-pay

## 2024-03-10 DIAGNOSIS — M5416 Radiculopathy, lumbar region: Secondary | ICD-10-CM

## 2024-03-10 DIAGNOSIS — M48062 Spinal stenosis, lumbar region with neurogenic claudication: Secondary | ICD-10-CM

## 2024-03-10 NOTE — Progress Notes (Signed)
 Orthopedic Spine Surgery Office Note   Assessment: Patient is a 53 y.o. male with low back pain that radiates into the right buttock and posterior thigh consistent with neurogenic claudication     Plan: -Patient has tried chiropractor, NSAIDs, Tylenol , physical therapy, steroid injections, Lyrica  -Patient has now had symptoms for over a year. He has tried several conservative treatments over the last year as listed above without relief so ordered an MRI of the lumbar spine to evaluate further -Will call him with the results of the MRI. Likely will discuss decompression surgery as a next option since he has tried all the conservative treatments that I typically try and symptoms have been present for over a year     Patient expressed understanding of the plan and all questions were answered to the patient's satisfaction.    ___________________________________________________________________________   History: Patient is a 53 y.o. male who comes in today for follow up on his lumbar spine. He is still having low back pain and right buttock and posterior thigh pain. He feels the pain when he is standing or walking. It gets better when he sits down or flexes the lumbar spine. He feels the pain on a daily basis. He has not found any treatments that provide him with lasting relief.    Previous treatments: chiropractor, NSAIDs, Tylenol , physical therapy, steroid injections, Lyrica     Physical Exam:   General: no acute distress, appears stated age Neurologic: alert, answering questions appropriately, following commands Respiratory: unlabored breathing on room air, symmetric chest rise Psychiatric: appropriate affect, normal cadence to speech     MSK (spine):   -Strength exam                                                   Left                  Right EHL                              5/5                  5/5 TA                                 5/5                  5/5 GSC                              5/5                  5/5 Knee extension            5/5                  5/5 Hip flexion                    5/5                  5/5   -Sensory exam  Sensation intact to light touch in L3-S1 nerve distributions of bilateral lower extremities   -Achilles DTR: 2/4 on the left, 2/4 on the right -Patellar tendon DTR: 2/4 on the left, 2/4 on the right   -Straight leg raise: negative bilaterally -Clonus: no beats bilaterally   -Left hip exam: No pain through range of motion, negative Stinchfield, negative FABER -Right hip exam: No pain through remainder of range of motion, negative Stinchfield, negative FABER   Imaging: XRs of the lumbar spine from 03/10/2024 were independently reviewed and interpreted, showing small anterior osteophyte formation at L4/5. No other significant degenerative changes seen. No evidence of instability on flexion/extension view. No fracture or dislocation seen.    EMG/NCS from 03/08/2023 was reviewed and shows chronic right L5 radiculopathy (moderate)     Patient name: Andrew Sharp Patient MRN: [id number] Date of visit: 03/10/24

## 2024-03-11 ENCOUNTER — Encounter: Payer: Self-pay | Admitting: Bariatrics

## 2024-03-12 ENCOUNTER — Ambulatory Visit: Admitting: Bariatrics

## 2024-03-20 ENCOUNTER — Ambulatory Visit (INDEPENDENT_AMBULATORY_CARE_PROVIDER_SITE_OTHER): Admitting: Pulmonary Disease

## 2024-03-20 VITALS — HR 71 | Ht 69.5 in | Wt 263.6 lb

## 2024-03-20 DIAGNOSIS — G4733 Obstructive sleep apnea (adult) (pediatric): Secondary | ICD-10-CM

## 2024-03-20 DIAGNOSIS — G4719 Other hypersomnia: Secondary | ICD-10-CM

## 2024-03-20 MED ORDER — MODAFINIL 200 MG PO TABS: 200.0000 mg | ORAL_TABLET | Freq: Every day | ORAL | 1 refills | Status: DC

## 2024-03-20 NOTE — Progress Notes (Signed)
 Andrew Sharp    [id number]    10-Feb-1971  Primary Care Physician:Fry, Lenis Quin, MD  Referring Physician: Donley Furth, MD [address] [place],  [place] [address]  Chief complaint:   History of snoring, witnessed apneas Diagnosed with sleep apnea and has been using CPAP  HPI: Patient with daytime sleepiness despite optimally treated sleep sleep disordered breathing  He does use Provigil  200 mg daily which helps his daytime sleepiness  Denies any issues with his CPAP Compliant with CPAP Benefiting from CPAP use  No shortness of breath Memory is fair  Weight is stable  Blood pressure well-controlled   Outpatient Encounter Medications as of 03/20/2024  Medication Sig   amLODipine  (NORVASC ) 10 MG tablet TAKE 1 TABLET(10 MG) BY MOUTH DAILY   baclofen  (LIORESAL ) 10 MG tablet Take 1 tablet (10 mg total) by mouth at bedtime.   buPROPion  (WELLBUTRIN  XL) 150 MG 24 hr tablet Take 1 tablet (150 mg total) by mouth daily.   cloNIDine  (CATAPRES ) 0.1 MG tablet TAKE 1 TABLET(0.1 MG) BY MOUTH TWICE DAILY   hydrALAZINE  (APRESOLINE ) 100 MG tablet Take 1 tablet (100 mg total) by mouth 3 (three) times daily.   losartan  (COZAAR ) 100 MG tablet Take 1 tablet (100 mg total) by mouth daily.   metoprolol  succinate (TOPROL -XL) 100 MG 24 hr tablet Take 1 tablet (100 mg total) by mouth daily. Take with or immediately following a meal.   modafinil  (PROVIGIL ) 200 MG tablet TAKE 1 TABLET(200 MG) BY MOUTH DAILY   omeprazole  (PRILOSEC) 20 MG capsule TAKE 1 CAPSULE(20 MG) BY MOUTH IN THE MORNING AND AT BEDTIME   OVER THE COUNTER MEDICATION Take 3 capsules by mouth daily. Beet Root   pregabalin  (LYRICA ) 75 MG capsule TAKE 1 CAPSULE(75 MG) BY MOUTH TWICE DAILY   spironolactone  (ALDACTONE ) 50 MG tablet Take 1 tablet (50 mg total) by mouth daily.   fenofibrate  (TRICOR ) 145 MG tablet Take 1 tablet (145 mg total) by mouth daily. (Patient not taking: Reported on 03/20/2024)    Semaglutide -Weight Management (WEGOVY ) 0.25 MG/0.5ML SOAJ Inject 0.25 mg into the skin once a week. (Patient not taking: Reported on 03/20/2024)   No facility-administered encounter medications on file as of 03/20/2024.    Allergies as of 03/20/2024   (No Known Allergies)    Past Medical History:  Diagnosis Date   Anxiety    Esophageal stricture    Fatty liver    GERD (gastroesophageal reflux disease)    High cholesterol    History of kidney stones    Hypertension    Joint pain    Pre-diabetes    Sleep apnea    Swelling of lower extremity     Past Surgical History:  Procedure Laterality Date   COLONOSCOPY  02/05/2023   per Dr. General Kenner, tubular adenoma, repeat in 7 yrs   CT CORONARY CA SCORING  09/19/2011   for chest pains, negative    ESOPHAGOGASTRODUODENOSCOPY (EGD) WITH PROPOFOL  N/A 04/09/2023   per Dr. General Kenner, hyperplastic polyp, no follow up needed   HEMOSTASIS CLIP PLACEMENT  04/09/2023   Procedure: HEMOSTASIS CLIP PLACEMENT;  Surgeon: Ace Holder, MD;  Location: [facility];  Service: Gastroenterology;;   KNEE ARTHROPLASTY Left    POLYPECTOMY  04/09/2023   Procedure: POLYPECTOMY;  Surgeon: Ace Holder, MD;  Location: [facility];  Service: Gastroenterology;;   Daryle Eon  04/09/2023   Procedure: Daryle Eon;  Surgeon: Ace Holder, MD;  Location: [facility];  Service: Gastroenterology;;    Family History  Problem Relation Age of Onset   Heart disease Mother    Kidney disease Mother    Diabetes Mother    High blood pressure Mother    High Cholesterol Mother    Heart disease Father    Diabetes Brother        MOTHER,AUNT   Pancreatic cancer Maternal Grandmother    Colon polyps Maternal Grandfather    Prostate cancer Maternal Grandfather    Colon cancer Maternal Aunt    Crohn's disease Other        niece   Esophageal cancer Neg Hx    Rectal cancer Neg Hx    Stomach cancer Neg Hx     Social History    Socioeconomic History   Marital status: Married    Spouse name: Not on file   Number of children: 1   Years of education: Not on file   Highest education level: Associate degree: occupational, Scientist, product/process development, or vocational program  Occupational History   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[employer]   Tobacco Use   Smoking status: Never   Smokeless tobacco: Never  Vaping Use   Vaping status: Never Used  Substance and Sexual Activity   Alcohol use: No    Alcohol/week: 0.0 standard drinks of alcohol   Drug use: No   Sexual activity: Not on file  Other Topics Concern   Not on file  Social History Narrative   Not on file   Social Drivers of Health   Financial Resource Strain: Low Risk  (08/18/2022)   Overall Financial Resource Strain (CARDIA)    Difficulty of Paying Living Expenses: Not very hard  Food Insecurity: Unknown (08/18/2022)   Hunger Vital Sign    Worried About Running Out of Food in the Last Year: Never true    Ran Out of Food in the Last Year: Patient declined  Transportation Needs: No Transportation Needs (08/18/2022)   PRAPARE - Administrator, Civil Service (Medical): No    Lack of Transportation (Non-Medical): No  Physical Activity: Unknown (08/18/2022)   Exercise Vital Sign    Days of Exercise per Week: 0 days    Minutes of Exercise per Session: Not on file  Stress: Stress Concern Present (08/18/2022)   [employer] of Occupational Health - Occupational Stress Questionnaire    Feeling of Stress : To some extent  Social Connections: Unknown (08/18/2022)   Social Connection and Isolation Panel [NHANES]    Frequency of Communication with Friends and Family: Never    Frequency of Social Gatherings with Friends and Family: Never    Attends Religious Services: Patient declined    Database administrator or Organizations: Patient declined    Attends Banker Meetings: Not on file    Marital Status: Married  Intimate Partner  Violence: Unknown (05/04/2022)   Received from [employer], [facility]   HITS    Physically Hurt: Not on file    Insult or Talk Down To: Not on file    Threaten Physical Harm: Not on file    Scream or Curse: Not on file    Review of Systems  Constitutional:  Positive for fatigue.  Respiratory:  Positive for apnea.   Psychiatric/Behavioral:  Positive for sleep disturbance.     Vitals:   03/20/24 1038 03/20/24 1040  Pulse: 68 71  SpO2: 96% 96%     Physical Exam Constitutional:      Appearance:  He is obese.  HENT:     Head: Normocephalic.  Eyes:     General: No scleral icterus.    Pupils: Pupils are equal, round, and reactive to light.  Cardiovascular:     Rate and Rhythm: Normal rate and regular rhythm.     Heart sounds: No murmur heard.    No friction rub.  Pulmonary:     Effort: No respiratory distress.     Breath sounds: No stridor. No wheezing or rhonchi.  Musculoskeletal:     Cervical back: No rigidity or tenderness.  Neurological:     Mental Status: He is alert.  Psychiatric:        Mood and Affect: Mood normal.    CPAP compliance reviewed showing excellent compliance at 94% AutoSet 5-15 95 percentile pressure of 11.8, maximum pressure of 13.4 with residual AHI of 2.1  Assessment:   Mild obstructive sleep apnea - Optimally treated with CPAP therapy  Excessive daytime sleepiness - Remains compliant with CPAP use - Benefiting from use of Provigil  - No significant side effects from Provigil  noted   Plan/Recommendations:  Encouraged to continue CPAP nightly  Prescription for Provigil  provided  Follow-up in a year  Encouraged to call with significant concerns    Myer Artis MD [facility] 03/20/2024, 10:44 AM  CC: Donley Furth, MD

## 2024-03-20 NOTE — Patient Instructions (Signed)
 Follow-up a year from now  Download from the machine shows the CPAP is working well  Continue Provigil   Call us  with significant concerns

## 2024-03-21 ENCOUNTER — Ambulatory Visit
Admission: RE | Admit: 2024-03-21 | Discharge: 2024-03-21 | Disposition: A | Discharge status: Home / Self Care | Source: Ambulatory Visit | Attending: Orthopedic Surgery | Admitting: Orthopedic Surgery

## 2024-03-21 DIAGNOSIS — M48062 Spinal stenosis, lumbar region with neurogenic claudication: Secondary | ICD-10-CM

## 2024-03-24 ENCOUNTER — Encounter: Payer: Self-pay | Admitting: Family Medicine

## 2024-03-25 ENCOUNTER — Encounter: Payer: Self-pay | Admitting: Orthopedic Surgery

## 2024-03-26 ENCOUNTER — Encounter: Payer: Self-pay | Admitting: Family Medicine

## 2024-03-26 ENCOUNTER — Ambulatory Visit: Admitting: Family Medicine

## 2024-03-26 VITALS — BP 120/74 | HR 75 | Temp 98.5°F | Wt 261.0 lb

## 2024-03-26 DIAGNOSIS — R300 Dysuria: Secondary | ICD-10-CM

## 2024-03-26 DIAGNOSIS — N390 Urinary tract infection, site not specified: Secondary | ICD-10-CM | POA: Diagnosis not present

## 2024-03-26 LAB — POC URINALSYSI DIPSTICK (AUTOMATED)
Bilirubin, UA: NEGATIVE
Blood, UA: NEGATIVE
Glucose, UA: NEGATIVE
Ketones, UA: NEGATIVE
Nitrite, UA: NEGATIVE
Protein, UA: POSITIVE — AB
Spec Grav, UA: 1.02 (ref 1.010–1.025)
Urobilinogen, UA: 0.2 U/dL
pH, UA: 6 (ref 5.0–8.0)

## 2024-03-26 MED ORDER — CIPROFLOXACIN HCL 500 MG PO TABS: 500.0000 mg | ORAL_TABLET | Freq: Two times a day (BID) | ORAL | 0 refills | Status: AC

## 2024-03-26 NOTE — Progress Notes (Signed)
   Subjective:    Patient ID: Andrew Sharp, male    DOB: 05/09/1971, 53 y.o.   MRN: [id number]  HPI Here for a possible UTI. He saw Dr. MacDiarmid for a urologic exam on 02-18-24, and he had a UTI. I do not have access to the culture results, but they gave him 7 days of Bactrim DS. He felt better for a few days but then the symptoms returned. He describes urgency of frequency of urination. No fever.    Review of Systems  Constitutional: Negative.   Respiratory: Negative.    Cardiovascular: Negative.   Genitourinary:  Positive for frequency and urgency. Negative for dysuria, flank pain and hematuria.       Objective:   Physical Exam Constitutional:      Appearance: Normal appearance.  Cardiovascular:     Rate and Rhythm: Normal rate and regular rhythm.     Pulses: Normal pulses.     Heart sounds: Normal heart sounds.  Pulmonary:     Effort: Pulmonary effort is normal.     Breath sounds: Normal breath sounds.  Abdominal:     Tenderness: There is no right CVA tenderness or left CVA tenderness.  Neurological:     Mental Status: He is alert.           Assessment & Plan:  Partially treated UTI. We will also culture the urine, and we will give him 10 days of Cipro . Corita Diego, MD

## 2024-03-27 ENCOUNTER — Telehealth: Payer: Self-pay | Admitting: Orthopedic Surgery

## 2024-03-27 NOTE — Telephone Encounter (Signed)
 Orthopedic Spine Surgery Note  Spoke with patient tonight on the telephone.  He still having significant low back pain radiating into his bilateral buttocks and his right posterior thigh.  He notes the pain is worse when he standing or walking gets better if he leans forward or sits down.  His symptoms are consistent with neurogenic claudication.  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  He has tried multiple conservative treatments since he has been seeing me over the last year.  These treatments include chiropractor, Aleve, Tylenol , physical therapy, steroid injections, Lyrica .  He has not noticed any lasting relief with these treatments, so discussed surgery as an option for him.  Specifically, discussed an L4/5 laminectomy.  I went over the risk, benefits, and terms of this proposed procedure.  After discussion, patient elected to proceed.  Will work to get get him on my operative schedule for a mutually convenient time.  Diedra Fowler, MD Orthopedic Surgeon

## 2024-03-29 LAB — URINE CULTURE
MICRO NUMBER:: 16543788
SPECIMEN QUALITY:: ADEQUATE

## 2024-03-31 ENCOUNTER — Ambulatory Visit: Admitting: Family Medicine

## 2024-03-31 ENCOUNTER — Ambulatory Visit: Payer: Self-pay | Admitting: Family Medicine

## 2024-04-04 ENCOUNTER — Encounter: Payer: Self-pay | Admitting: Orthopedic Surgery

## 2024-04-08 ENCOUNTER — Ambulatory Visit: Admitting: Bariatrics

## 2024-04-10 ENCOUNTER — Other Ambulatory Visit: Payer: Self-pay | Admitting: Family Medicine

## 2024-04-12 ENCOUNTER — Other Ambulatory Visit: Payer: Self-pay | Admitting: Nurse Practitioner

## 2024-04-14 ENCOUNTER — Ambulatory Visit: Admitting: Bariatrics

## 2024-04-14 NOTE — Telephone Encounter (Signed)
 I spoke with the patient and scheduled his surgery for 7/19.

## 2024-05-01 NOTE — Progress Notes (Signed)
 Surgical Instructions   Your procedure is scheduled on Friday May 09, 2024. Report to [facility] Main Entrance A at 9:30 A.M., then check in with the Admitting office. Any questions or running late day of surgery: call [phone]  Questions prior to your surgery date: call [phone], Monday-Friday, 8am-4pm. If you experience any cold or flu symptoms such as cough, fever, chills, shortness of breath, etc. between now and your scheduled surgery, please notify us  at the above number.     Remember:  Do not eat after midnight the night before your surgery  You may drink clear liquids until 8:30 the morning of your surgery.   Clear liquids allowed are: Water, Non-Citrus Juices (without pulp), Carbonated Beverages, Clear Tea (no milk, honey, etc.), Black Coffee Only (NO MILK, CREAM OR POWDERED CREAMER of any kind), and Gatorade.  Patient Instructions  The night before surgery:  No food after midnight. ONLY clear liquids after midnight  The day of surgery (if you do NOT have diabetes):  Drink ONE (1) Pre-Surgery Clear Ensure by 8:30 the morning of surgery. Drink in one sitting. Do not sip.  This drink was given to you during your hospital  pre-op appointment visit.  Nothing else to drink after completing the  Pre-Surgery Clear Ensure.         If you have questions, please contact your surgeon's office.    Take these medicines the morning of surgery with A SIP OF WATER  amLODipine  (NORVASC )  buPROPion  (WELLBUTRIN  XL)  cloNIDine  (CATAPRES )  fenofibrate  (TRICOR )  hydrALAZINE  (APRESOLINE )  metoprolol  succinate (TOPROL -XL)  omeprazole  (PRILOSEC)  pregabalin  (LYRICA )   DO NOT TAKE YOUR Semaglutide -Weight Management (WEGOVY ) 7 DAYS PRIOR TO SURGERY WITH THE LAST DOSE BEING NO LATER THAN 05/01/2024.     One week prior to surgery, STOP taking any Aspirin (unless otherwise instructed by your surgeon) Aleve, Naproxen, Ibuprofen, Motrin, Advil, Goody's, BC's, all herbal medications,  fish oil, and non-prescription vitamins.                     Do NOT Smoke (Tobacco/Vaping) for 24 hours prior to your procedure.  If you use a CPAP at night, you may bring your mask/headgear for your overnight stay.   You will be asked to remove any contacts, glasses, piercing's, hearing aid's, dentures/partials prior to surgery. Please bring cases for these items if needed.    Patients discharged the day of surgery will not be allowed to drive home, and someone needs to stay with them for 24 hours.  SURGICAL WAITING ROOM VISITATION Patients may have no more than 2 support people in the waiting area - these visitors may rotate.   Pre-op nurse will coordinate an appropriate time for 1 ADULT support person, who may not rotate, to accompany patient in pre-op.  Children under the age of 36 must have an adult with them who is not the patient and must remain in the main waiting area with an adult.  If the patient needs to stay at the hospital during part of their recovery, the visitor guidelines for inpatient rooms apply.  Please refer to the [facility] website for the visitor guidelines for any additional information.   If you received a COVID test during your pre-op visit  it is requested that you wear a mask when out in public, stay away from anyone that may not be feeling well and notify your surgeon if you develop symptoms. If you have been in contact with anyone that has  tested positive in the last 10 days please notify you surgeon.      Pre-operative 5 CHG Bathing Instructions   You can play a key role in reducing the risk of infection after surgery. Your skin needs to be as free of germs as possible. You can reduce the number of germs on your skin by washing with CHG (chlorhexidine gluconate) soap before surgery. CHG is an antiseptic soap that kills germs and continues to kill germs even after washing.   DO NOT use if you have an allergy to chlorhexidine/CHG or antibacterial soaps.  If your skin becomes reddened or irritated, stop using the CHG and notify one of our RNs at [phone].   Please shower with the CHG soap starting 4 days before surgery using the following schedule:     Please keep in mind the following:  You may shave your face at any point before/day of surgery.  Place clean sheets on your bed the day you start using CHG soap. Use a clean washcloth (not used since being washed) for each shower. DO NOT sleep with pets once you start using the CHG.   CHG Shower Instructions:  Wash your face and private area with normal soap. If you choose to wash your hair, wash first with your normal shampoo.  After you use shampoo/soap, rinse your hair and body thoroughly to remove shampoo/soap residue.  Turn the water OFF and apply about 3 tablespoons (45 ml) of CHG soap to a CLEAN washcloth.  Apply CHG soap ONLY FROM YOUR NECK DOWN TO YOUR TOES (washing for 3-5 minutes)  DO NOT use CHG soap on face, private areas, open wounds, or sores.  Pay special attention to the area where your surgery is being performed.  If you are having back surgery, having someone wash your back for you may be helpful. Wait 2 minutes after CHG soap is applied, then you may rinse off the CHG soap.  Pat dry with a clean towel  Put on clean clothes/pajamas   If you choose to wear lotion, please use ONLY the CHG-compatible lotions that are listed below.  Additional instructions for the day of surgery: DO NOT APPLY any lotions, deodorants or cologne.   Do not bring valuables to the hospital. [facility] is not responsible for any belongings/valuables. Do not wear jewelry  Put on clean/comfortable clothes.  Please brush your teeth.  Ask your nurse before applying any prescription medications to the skin.     CHG Compatible Lotions   Aveeno Moisturizing lotion  Cetaphil Moisturizing Cream  Cetaphil Moisturizing Lotion  Clairol Herbal Essence Moisturizing Lotion, Dry Skin  Clairol  Herbal Essence Moisturizing Lotion, Extra Dry Skin  Clairol Herbal Essence Moisturizing Lotion, Normal Skin  Curel Age Defying Therapeutic Moisturizing Lotion with Alpha Hydroxy  Curel Extreme Care Body Lotion  Curel Soothing Hands Moisturizing Hand Lotion  Curel Therapeutic Moisturizing Cream, Fragrance-Free  Curel Therapeutic Moisturizing Lotion, Fragrance-Free  Curel Therapeutic Moisturizing Lotion, Original Formula  Eucerin Daily Replenishing Lotion  Eucerin Dry Skin Therapy Plus Alpha Hydroxy Crme  Eucerin Dry Skin Therapy Plus Alpha Hydroxy Lotion  Eucerin Original Crme  Eucerin Original Lotion  Eucerin Plus Crme Eucerin Plus Lotion  Eucerin TriLipid Replenishing Lotion  Keri Anti-Bacterial Hand Lotion  Keri Deep Conditioning Original Lotion Dry Skin Formula Softly Scented  Keri Deep Conditioning Original Lotion, Fragrance Free Sensitive Skin Formula  Keri Lotion Fast Absorbing Fragrance Free Sensitive Skin Formula  Keri Lotion Fast Absorbing Softly Scented Dry Skin Formula  Keri Original Lotion  [employer] Skin Renewal Lotion Keri Silky Smooth Lotion  Keri Silky Smooth Sensitive Skin Lotion  Nivea Body Creamy Conditioning Patent examiner Moisturizing Lotion Nivea Crme  Nivea Skin Firming Lotion  NutraDerm 30 Skin Lotion  NutraDerm Skin Lotion  NutraDerm Therapeutic Skin Cream  NutraDerm Therapeutic Skin Lotion  ProShield Protective Hand Cream  Provon moisturizing lotion  Please read over the following fact sheets that you were given.

## 2024-05-02 ENCOUNTER — Other Ambulatory Visit: Payer: Self-pay

## 2024-05-02 ENCOUNTER — Encounter (HOSPITAL_COMMUNITY)
Admission: RE | Admit: 2024-05-02 | Discharge: 2024-05-02 | Disposition: A | Discharge status: Home / Self Care | Source: Ambulatory Visit | Attending: Orthopedic Surgery

## 2024-05-02 ENCOUNTER — Encounter (HOSPITAL_COMMUNITY): Payer: Self-pay

## 2024-05-02 DIAGNOSIS — Z01812 Encounter for preprocedural laboratory examination: Secondary | ICD-10-CM | POA: Insufficient documentation

## 2024-05-02 DIAGNOSIS — Z01818 Encounter for other preprocedural examination: Secondary | ICD-10-CM

## 2024-05-02 LAB — CBC
HCT: 41.7 % (ref 39.0–52.0)
Hemoglobin: 14.4 g/dL (ref 13.0–17.0)
MCH: 31.8 pg (ref 26.0–34.0)
MCHC: 34.5 g/dL (ref 30.0–36.0)
MCV: 92.1 fL (ref 80.0–100.0)
Platelets: 262 K/uL (ref 150–400)
RBC: 4.53 MIL/uL (ref 4.22–5.81)
RDW: 13.3 % (ref 11.5–15.5)
WBC: 8.6 K/uL (ref 4.0–10.5)
nRBC: 0 % (ref 0.0–0.2)

## 2024-05-02 LAB — SURGICAL PCR SCREEN
MRSA, PCR: NEGATIVE
Staphylococcus aureus: NEGATIVE

## 2024-05-02 NOTE — Progress Notes (Signed)
 PCP - Johnny Garnette LABOR, MD  Cardiologist -  Pulmonologist- Neda Jennet LABOR, MD   PPM/ICD - denies Device Orders - n/a Rep Notified - n/a  Chest x-ray - denies EKG - 02-13-24 Stress Test - denies ECHO - denies Cardiac Cath - denies  Sleep Study - Home sleep test 10-31-21 CPAP - nightly  DM - prediabetes. Per patient MD monitors A1c. Last A1c 6.4 on 12-14-23  Blood Thinner Instructions: denies Aspirin Instructions:n/a  ERAS Protcol - Clear liquids until 8:30 am PRE-SURGERY Ensure   COVID TEST- n/a   Anesthesia review: Yes Hx; of HTN, OSA, HLD and clearance provided  Patient denies shortness of breath, fever, cough and chest pain at PAT appointment   All instructions explained to the patient, with a verbal understanding of the material. Patient agrees to go over the instructions while at home for a better understanding. Patient also instructed to self quarantine after being tested for COVID-19. The opportunity to ask questions was provided.

## 2024-05-06 ENCOUNTER — Other Ambulatory Visit: Payer: Self-pay | Admitting: Orthopedic Surgery

## 2024-05-06 ENCOUNTER — Other Ambulatory Visit: Payer: Self-pay | Admitting: Family Medicine

## 2024-05-08 NOTE — H&P (Signed)
 Orthopedic Spine Surgery H&P Note  Assessment: Patient is a 53 y.o. 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 The benefit of the surgery would be improvement in the patient's radiating leg pain.  I explained that back pain relief is not the goal of the surgery and it is not reliably alleviated with this surgery. The alternatives to surgical management were covered with the patient and included continued monitoring, physical therapy, over-the-counter pain medications, ambulatory aids, injections, and activity modification. All the patient's questions about the surgery were answered to his satisfaction. After this discussion, the patient expressed understanding and elected to proceed with surgical intervention.    ___________________________________________________________________________  Chief Complaint: Low back pain that radiates into bilateral buttocks and posterior right thigh  History: Patient is 53 y.o. male who has been previously seen in the office for low back pain that radiates into his bilateral buttocks and right posterior thigh.  The pain is present when standing or walking improves with flexion of the lumbar spine consistent with neurogenic claudication.  His symptoms failed to improve with conservative treatment so operative management was discussed at the last office visit. The patient presents today with  no changes in his symptoms since the last office visit. See previous office note for further details.    Review of systems: General: denies fevers and chills, myalgias Neurologic: denies recent changes in vision, slurred speech Abdomen: denies nausea, vomiting, hematemesis Respiratory: denies cough, shortness of breath  Past medical history: Esophageal stricture HTN GERD History of kidney stones   Allergies: NKDA   Past surgical history:  Left TKA R knee arthroscopy x2 Quadriceps tendon repair   Social history: Denies use of nicotine product (smoking, vaping, patches, smokeless) Alcohol use: denies Denies recreational drug use  Family history: -reviewed and not pertinent to neurogenic claudication   Physical Exam:  General: no acute distress, appears stated age Neurologic: alert, answering questions appropriately, following commands Cardiovascular: regular rate, no cyanosis Respiratory: unlabored breathing on room air, symmetric chest rise Psychiatric: appropriate affect, normal cadence to speech   MSK (spine):  -Strength exam      Left  Right  EHL    5/5  5/5 TA    5/5  5/5 GSC    5/5  5/5 Knee extension  5/5  5/5 Knee flexion   5/5  5/5 Hip flexion   5/5  5/5  -Sensory exam    Sensation intact to light touch in L3-S1 nerve distributions of bilateral lower extremities   Patient name: Andrew Sharp Patient MRN: [id number] Date: 05/09/2024

## 2024-05-09 ENCOUNTER — Encounter (HOSPITAL_COMMUNITY): Payer: Self-pay | Admitting: Orthopedic Surgery

## 2024-05-09 ENCOUNTER — Ambulatory Visit (HOSPITAL_COMMUNITY)
Admission: RE | Admit: 2024-05-09 | Discharge: 2024-05-09 | Disposition: A | Discharge status: Home / Self Care | Source: Ambulatory Visit | Attending: Orthopedic Surgery | Admitting: Orthopedic Surgery

## 2024-05-09 ENCOUNTER — Ambulatory Visit (HOSPITAL_BASED_OUTPATIENT_CLINIC_OR_DEPARTMENT_OTHER): Admitting: Anesthesiology

## 2024-05-09 ENCOUNTER — Ambulatory Visit (HOSPITAL_COMMUNITY): Admitting: Physician Assistant

## 2024-05-09 ENCOUNTER — Other Ambulatory Visit (HOSPITAL_COMMUNITY): Payer: Self-pay

## 2024-05-09 ENCOUNTER — Other Ambulatory Visit: Payer: Self-pay

## 2024-05-09 ENCOUNTER — Encounter (HOSPITAL_COMMUNITY)
Admission: RE | Disposition: A | Discharge status: Home / Self Care | Payer: Self-pay | Source: Ambulatory Visit | Attending: Orthopedic Surgery

## 2024-05-09 ENCOUNTER — Ambulatory Visit (HOSPITAL_COMMUNITY)

## 2024-05-09 DIAGNOSIS — Z79899 Other long term (current) drug therapy: Secondary | ICD-10-CM | POA: Insufficient documentation

## 2024-05-09 DIAGNOSIS — K219 Gastro-esophageal reflux disease without esophagitis: Secondary | ICD-10-CM | POA: Diagnosis not present

## 2024-05-09 DIAGNOSIS — I10 Essential (primary) hypertension: Secondary | ICD-10-CM | POA: Insufficient documentation

## 2024-05-09 DIAGNOSIS — G4730 Sleep apnea, unspecified: Secondary | ICD-10-CM | POA: Insufficient documentation

## 2024-05-09 DIAGNOSIS — M48062 Spinal stenosis, lumbar region with neurogenic claudication: Secondary | ICD-10-CM

## 2024-05-09 DIAGNOSIS — Z01818 Encounter for other preprocedural examination: Secondary | ICD-10-CM

## 2024-05-09 HISTORY — PX: DECOMPRESSIVE LUMBAR LAMINECTOMY LEVEL 1: SHX5791

## 2024-05-09 LAB — BASIC METABOLIC PANEL WITH GFR
Anion gap: 9 (ref 5–15)
BUN: 15 mg/dL (ref 6–20)
CO2: 20 mmol/L — ABNORMAL LOW (ref 22–32)
Calcium: 9.8 mg/dL (ref 8.9–10.3)
Chloride: 104 mmol/L (ref 98–111)
Creatinine, Ser: 1.46 mg/dL — ABNORMAL HIGH (ref 0.61–1.24)
GFR, Estimated: 57 mL/min — ABNORMAL LOW (ref >60)
Glucose, Bld: 121 mg/dL — ABNORMAL HIGH (ref 70–99)
Potassium: 4.3 mmol/L (ref 3.5–5.1)
Sodium: 133 mmol/L — ABNORMAL LOW (ref 135–145)

## 2024-05-09 SURGERY — DECOMPRESSIVE LUMBAR LAMINECTOMY LEVEL 1
Anesthesia: General

## 2024-05-09 MED ORDER — ACETAMINOPHEN 10 MG/ML IV SOLN
INTRAVENOUS | Status: AC
  Filled 2024-05-09: qty 100

## 2024-05-09 MED ORDER — DEXAMETHASONE SODIUM PHOSPHATE 10 MG/ML IJ SOLN
10.0000 mg | Freq: Once | INTRAMUSCULAR | Status: AC
Start: 2024-05-09 — End: 2024-05-09
  Administered 2024-05-09: 10 mg via INTRAVENOUS
  Filled 2024-05-09: qty 1

## 2024-05-09 MED ORDER — PROPOFOL 10 MG/ML IV BOLUS
INTRAVENOUS | Status: AC
  Filled 2024-05-09: qty 20

## 2024-05-09 MED ORDER — CEFAZOLIN SODIUM-DEXTROSE 2-4 GM/100ML-% IV SOLN
2.0000 g | INTRAVENOUS | Status: AC
  Administered 2024-05-09: 2 g via INTRAVENOUS
  Filled 2024-05-09: qty 100

## 2024-05-09 MED ORDER — LACTATED RINGERS IV SOLN: INTRAVENOUS | Status: DC

## 2024-05-09 MED ORDER — PHENYLEPHRINE HCL-NACL 20-0.9 MG/250ML-% IV SOLN
INTRAVENOUS | Status: DC | PRN
  Administered 2024-05-09: 15 ug/min via INTRAVENOUS

## 2024-05-09 MED ORDER — ROCURONIUM BROMIDE 10 MG/ML (PF) SYRINGE
PREFILLED_SYRINGE | INTRAVENOUS | Status: DC | PRN
  Administered 2024-05-09: 50 mg via INTRAVENOUS
  Administered 2024-05-09: 30 mg via INTRAVENOUS
  Administered 2024-05-09: 20 mg via INTRAVENOUS

## 2024-05-09 MED ORDER — HYDROMORPHONE HCL 1 MG/ML IJ SOLN
INTRAMUSCULAR | Status: AC
  Filled 2024-05-09: qty 0.5

## 2024-05-09 MED ORDER — ALBUMIN HUMAN 5 % IV SOLN: INTRAVENOUS | Status: DC | PRN

## 2024-05-09 MED ORDER — VANCOMYCIN HCL 1000 MG IV SOLR
INTRAVENOUS | Status: DC | PRN
Start: 2024-05-09 — End: 2024-05-09
  Administered 2024-05-09: 1000 mg via TOPICAL

## 2024-05-09 MED ORDER — ACETAMINOPHEN 500 MG PO TABS
1000.0000 mg | ORAL_TABLET | Freq: Three times a day (TID) | ORAL | 0 refills | Status: AC
  Filled 2024-05-09: qty 84, 14d supply, fill #0

## 2024-05-09 MED ORDER — CHLORHEXIDINE GLUCONATE 0.12 % MT SOLN
15.0000 mL | Freq: Once | OROMUCOSAL | Status: AC
  Administered 2024-05-09: 15 mL via OROMUCOSAL
  Filled 2024-05-09: qty 15

## 2024-05-09 MED ORDER — STERILE WATER FOR IRRIGATION IR SOLN
Status: DC | PRN
  Administered 2024-05-09: 1000 mL

## 2024-05-09 MED ORDER — ROCURONIUM BROMIDE 10 MG/ML (PF) SYRINGE
PREFILLED_SYRINGE | INTRAVENOUS | Status: AC
Start: 2024-05-09 — End: 2024-05-09
  Filled 2024-05-09: qty 10

## 2024-05-09 MED ORDER — ACETAMINOPHEN 10 MG/ML IV SOLN: 1000.0000 mg | Freq: Once | INTRAVENOUS | Status: DC | PRN

## 2024-05-09 MED ORDER — MIDAZOLAM HCL 2 MG/2ML IJ SOLN
INTRAMUSCULAR | Status: AC
  Filled 2024-05-09: qty 2

## 2024-05-09 MED ORDER — LIDOCAINE 2% (20 MG/ML) 5 ML SYRINGE
INTRAMUSCULAR | Status: AC
Start: 2024-05-09 — End: 2024-05-09
  Filled 2024-05-09: qty 5

## 2024-05-09 MED ORDER — FENTANYL CITRATE (PF) 100 MCG/2ML IJ SOLN
INTRAMUSCULAR | Status: AC
  Filled 2024-05-09: qty 2

## 2024-05-09 MED ORDER — SUGAMMADEX SODIUM 200 MG/2ML IV SOLN
INTRAVENOUS | Status: DC | PRN
  Administered 2024-05-09 (×2): 200 mg via INTRAVENOUS

## 2024-05-09 MED ORDER — OXYCODONE HCL 5 MG PO TABS
ORAL_TABLET | ORAL | Status: AC
  Filled 2024-05-09: qty 1

## 2024-05-09 MED ORDER — MIDAZOLAM HCL 2 MG/2ML IJ SOLN
INTRAMUSCULAR | Status: DC | PRN
  Administered 2024-05-09: 2 mg via INTRAVENOUS

## 2024-05-09 MED ORDER — ORAL CARE MOUTH RINSE: 15.0000 mL | Freq: Once | OROMUCOSAL | Status: AC

## 2024-05-09 MED ORDER — HYDROMORPHONE HCL 1 MG/ML IJ SOLN
INTRAMUSCULAR | Status: DC | PRN
  Administered 2024-05-09: .5 mg via INTRAVENOUS

## 2024-05-09 MED ORDER — FENTANYL CITRATE (PF) 100 MCG/2ML IJ SOLN
25.0000 ug | INTRAMUSCULAR | Status: DC | PRN
  Administered 2024-05-09 (×2): 25 ug via INTRAVENOUS

## 2024-05-09 MED ORDER — BUPIVACAINE-EPINEPHRINE 0.25% -1:200000 IJ SOLN
INTRAMUSCULAR | Status: DC | PRN
  Administered 2024-05-09: 30 mL

## 2024-05-09 MED ORDER — VANCOMYCIN HCL 1000 MG IV SOLR
INTRAVENOUS | Status: AC
  Filled 2024-05-09: qty 20

## 2024-05-09 MED ORDER — GLYCOPYRROLATE PF 0.2 MG/ML IJ SOSY
PREFILLED_SYRINGE | INTRAMUSCULAR | Status: AC
Start: 2024-05-09 — End: 2024-05-09
  Filled 2024-05-09: qty 1

## 2024-05-09 MED ORDER — PROPOFOL 500 MG/50ML IV EMUL
INTRAVENOUS | Status: DC | PRN
  Administered 2024-05-09: 50 ug/kg/min via INTRAVENOUS

## 2024-05-09 MED ORDER — METHOCARBAMOL 500 MG PO TABS
500.0000 mg | ORAL_TABLET | Freq: Four times a day (QID) | ORAL | 0 refills | Status: AC
  Filled 2024-05-09: qty 40, 10d supply, fill #0

## 2024-05-09 MED ORDER — 0.9 % SODIUM CHLORIDE (POUR BTL) OPTIME
TOPICAL | Status: DC | PRN
  Administered 2024-05-09: 1000 mL

## 2024-05-09 MED ORDER — POLYETHYLENE GLYCOL 3350 17 GM/SCOOP PO POWD
17.0000 g | Freq: Every day | ORAL | 0 refills | Status: AC
  Filled 2024-05-09: qty 238, 14d supply, fill #0

## 2024-05-09 MED ORDER — OXYCODONE HCL 5 MG/5ML PO SOLN: 5.0000 mg | Freq: Once | ORAL | Status: AC | PRN

## 2024-05-09 MED ORDER — ACETAMINOPHEN 10 MG/ML IV SOLN
INTRAVENOUS | Status: DC | PRN
  Administered 2024-05-09: 1000 mg via INTRAVENOUS

## 2024-05-09 MED ORDER — LIDOCAINE 2% (20 MG/ML) 5 ML SYRINGE
INTRAMUSCULAR | Status: DC | PRN
  Administered 2024-05-09: 100 mg via INTRAVENOUS

## 2024-05-09 MED ORDER — PROPOFOL 1000 MG/100ML IV EMUL
INTRAVENOUS | Status: AC
  Filled 2024-05-09: qty 100

## 2024-05-09 MED ORDER — TRANEXAMIC ACID-NACL 1000-0.7 MG/100ML-% IV SOLN
1000.0000 mg | INTRAVENOUS | Status: AC
  Administered 2024-05-09: 1000 mg via INTRAVENOUS
  Filled 2024-05-09: qty 100

## 2024-05-09 MED ORDER — PROPOFOL 10 MG/ML IV BOLUS
INTRAVENOUS | Status: DC | PRN
  Administered 2024-05-09: 50 mg via INTRAVENOUS
  Administered 2024-05-09: 150 mg via INTRAVENOUS

## 2024-05-09 MED ORDER — FENTANYL CITRATE (PF) 250 MCG/5ML IJ SOLN
INTRAMUSCULAR | Status: AC
  Filled 2024-05-09: qty 5

## 2024-05-09 MED ORDER — OXYCODONE HCL 5 MG PO TABS
5.0000 mg | ORAL_TABLET | ORAL | 0 refills | Status: AC | PRN
  Filled 2024-05-09: qty 40, 7d supply, fill #0

## 2024-05-09 MED ORDER — DEXMEDETOMIDINE HCL IN NACL 80 MCG/20ML IV SOLN
INTRAVENOUS | Status: DC | PRN
  Administered 2024-05-09 (×2): 8 ug via INTRAVENOUS

## 2024-05-09 MED ORDER — SENNA 8.6 MG PO TABS
1.0000 | ORAL_TABLET | Freq: Two times a day (BID) | ORAL | 0 refills | Status: AC
  Filled 2024-05-09: qty 28, 14d supply, fill #0

## 2024-05-09 MED ORDER — GLYCOPYRROLATE PF 0.2 MG/ML IJ SOSY
PREFILLED_SYRINGE | INTRAMUSCULAR | Status: DC | PRN
  Administered 2024-05-09: .1 mg via INTRAVENOUS

## 2024-05-09 MED ORDER — FENTANYL CITRATE (PF) 250 MCG/5ML IJ SOLN
INTRAMUSCULAR | Status: DC | PRN
  Administered 2024-05-09: 50 ug via INTRAVENOUS

## 2024-05-09 MED ORDER — ONDANSETRON HCL 4 MG/2ML IJ SOLN
INTRAMUSCULAR | Status: DC | PRN
  Administered 2024-05-09: 4 mg via INTRAVENOUS

## 2024-05-09 MED ORDER — PHENYLEPHRINE 80 MCG/ML (10ML) SYRINGE FOR IV PUSH (FOR BLOOD PRESSURE SUPPORT)
PREFILLED_SYRINGE | INTRAVENOUS | Status: DC | PRN
  Administered 2024-05-09 (×2): 160 ug via INTRAVENOUS
  Administered 2024-05-09: 80 ug via INTRAVENOUS
  Administered 2024-05-09: 160 ug via INTRAVENOUS

## 2024-05-09 MED ORDER — OXYCODONE HCL 5 MG PO TABS
5.0000 mg | ORAL_TABLET | Freq: Once | ORAL | Status: AC | PRN
  Administered 2024-05-09: 5 mg via ORAL

## 2024-05-09 MED ORDER — BUPIVACAINE-EPINEPHRINE (PF) 0.25% -1:200000 IJ SOLN
INTRAMUSCULAR | Status: AC
  Filled 2024-05-09: qty 30

## 2024-05-09 MED ORDER — LACTATED RINGERS IV SOLN: INTRAVENOUS | Status: DC | PRN

## 2024-05-09 SURGICAL SUPPLY — 35 items
BUR MATCHSTICK NEURO 3.0 LAGG (BURR) ×2 IMPLANT
CANISTER SUCTION 3000ML PPV (SUCTIONS) ×2 IMPLANT
COVER MAYO STAND STRL (DRAPES) ×6 IMPLANT
COVER SURGICAL LIGHT HANDLE (MISCELLANEOUS) ×2 IMPLANT
DERMABOND ADVANCED .7 DNX12 (GAUZE/BANDAGES/DRESSINGS) ×2 IMPLANT
DRAPE C-ARM 42X72 X-RAY (DRAPES) ×2 IMPLANT
DRAPE TOWEL STERILE LF 18X24 (DRAPES) IMPLANT
DRAPE UTILITY XL STRL (DRAPES) ×4 IMPLANT
DRSG MEPILEX POST OP 4X8 (GAUZE/BANDAGES/DRESSINGS) IMPLANT
DRSG TEGADERM 4X4.75 (GAUZE/BANDAGES/DRESSINGS) IMPLANT
DURAPREP 26ML APPLICATOR (WOUND CARE) ×2 IMPLANT
ELECT COATED BLADE 2.86 ST (ELECTRODE) ×2 IMPLANT
ELECT PENCIL ROCKER SW 15FT (MISCELLANEOUS) ×2 IMPLANT
ELECTRODE REM PT RTRN 9FT ADLT (ELECTROSURGICAL) ×2 IMPLANT
GAUZE SPONGE 4X4 12PLY STRL (GAUZE/BANDAGES/DRESSINGS) ×2 IMPLANT
GLOVE INDICATOR 7.5 STRL GRN (GLOVE) ×2 IMPLANT
GLOVE SS BIOGEL STRL SZ 7.5 (GLOVE) ×2 IMPLANT
GOWN STRL REUS W/ TWL LRG LVL3 (GOWN DISPOSABLE) ×2 IMPLANT
GOWN STRL SURGICAL XL XLNG (GOWN DISPOSABLE) ×2 IMPLANT
KIT BASIN OR (CUSTOM PROCEDURE TRAY) ×2 IMPLANT
KIT POSITIONER JACKSON TABLE (MISCELLANEOUS) ×2 IMPLANT
KIT TURNOVER KIT B (KITS) ×2 IMPLANT
NS IRRIG 1000ML POUR BTL (IV SOLUTION) ×2 IMPLANT
PACK LAMINECTOMY ORTHO (CUSTOM PROCEDURE TRAY) ×2 IMPLANT
PENCIL BUTTON HOLSTER BLD 10FT (ELECTRODE) IMPLANT
SUCTION TUBE FRAZIER 10FR DISP (SUCTIONS) ×2 IMPLANT
SUT BONE WAX W31G (SUTURE) ×2 IMPLANT
SUT MNCRL AB 3-0 PS2 27 (SUTURE) ×2 IMPLANT
SUT VIC AB 0 CT1 18XCR BRD8 (SUTURE) ×2 IMPLANT
SUT VIC AB 2-0 CT1 18 (SUTURE) ×2 IMPLANT
SYR CONTROL 10ML LL (SYRINGE) IMPLANT
TOWEL GREEN STERILE (TOWEL DISPOSABLE) ×2 IMPLANT
TOWEL GREEN STERILE FF (TOWEL DISPOSABLE) ×2 IMPLANT
TUBING FEATHERFLOW (TUBING) ×2 IMPLANT
WATER STERILE IRR 1000ML POUR (IV SOLUTION) ×2 IMPLANT

## 2024-05-09 NOTE — Op Note (Signed)
 Orthopedic Spine Surgery Operative Report  Procedure: L4, L5 segment lumbar laminectomy  Modifier: none  Date of procedure: 05/09/2024  Patient name: Andrew Sharp MRN: [id number] DOB: November 26, 1970  Surgeon: Ozell Ada, MD Assistant: none Pre-operative diagnosis: lumbar stenosis with neurogenic claudication Post-operative diagnosis: same as above Findings: L4/5 hypertrophic facets and thickened ligamentum flavum  Specimens: none Anesthesia: general EBL: 50cc Complications: none Pre-incision antibiotic: ancef TXA was given prior to incision as well  Implants: none   Indication for procedure: Patient is a 53 y.o. male who presented to the office with symptoms consistent with lumbar stenosis and neurogenic claudication. The patient had tried conservative treatments that did not provide any lasting relief. As result, operative management was discussed. The pre-operative MRI showed stenosis so a L4/5 laminectomy was presented as a treatment option. The risks including but not limited to iatrogenic instability, dural tear, nerve root injury, paralysis, persistent pain, infection, bleeding, heart attack, death, stroke, fracture, and need for additional procedures were discussed with the patient. The benefit of the surgery would be relief of the patient's symptoms related to the lumbar stenosis. The alternatives to surgical management were covered with the patient and included continued monitoring, physical therapy, over-the-counter pain medications, ambulatory aids, and activity modification. All the patient's questions were answered to his satisfaction. After this discussion, the patient expressed understanding and elected to proceed with surgical intervention.   Procedure Description: The patient was met in the pre-operative holding area. The patient's identity and consent were verified. The operative site was marked. The patient's remaining questions about the surgery were answered. The  patient was brought back to the operating room. General anesthesia was induced and an endotracheal tube was placed by the anesthesia staff. The patient was transferred to the prone [place] table in the prone position. All bony prominences were well padded. The head of the bed was slightly elevated and the eyes were free from compression by the face pillow. An electric razor was used to remove his hair over the lumbar region. The surgical area was cleansed with alcohol. Fluoroscopy was then brought in to check rotation on the AP image and to mark the levels on the lateral image. The patient's skin was then prepped and draped in a standard, sterile fashion. A time out was performed that identified the patient, the procedure, and the operative levels. All team members agreed with what was stated in the time out.   A midline incision over the spinous processes of the previously marked levels was made and sharp dissection was continued down through the skin and dermis. Electrocautery was then used to continue the midline dissection down to the level of the spinous process. Subperiosteal dissection was performed using electrocautery to expose the lamina out lateral to the facet joint capsule. Care was taken to not violate the facet joint capsules. A lateral fluoroscopic image was taken to confirm the level. Subperiosteal dissection with electrocautery was then done to expose all the lamina and pars interarticularis of L4 and L5. Again, care was taken to avoid disruption of the facet capsules.    A rongeur was used to remove the spinous processes and interspinous ligaments between the L3/4 interspinous ligament to the cranial portion of the L5 spinous process. Bone wax was used to obtain hemostasis at the bleeding bony surfaces. A high-speed burr was used to thin the lamina at all planned laminectomy levels to the level of the ligamentum flavum. Above the level of the ligamentum, the lamina was thinned with the burr  to  the approximate level of the ligamentum. Care was taken to leave at least 8mm of pars interarticularis on each side. A series of Kerrison rongeurs were used to remove the remaining lamina and ligamentum overlying the thecal sac. A woodsen was then used to protect the thecal sac as a kerrison was used to remove the medial portion of the L4/5 facet and the ligamentum in the lateral recess. The same process was repeated on the contralateral side.   A woodsen was placed into the laminectomy site to palpate for any remaining areas of stenosis. The woodsen was able to be passed freely around the thecal sac and no further areas of stenosis were felt.   The wound was copiously irrigated with sterile saline. 1g of vancomycin powder was placed into the wound. The fascia was reapproximated with 0 vicryl suture. The subcutaneous fat was reapproximated with 0 vicryl suture. The deep dermal layer was reapproximated with 2-0 vicryl. The skin as closed with a 3-0 running monocryl. All counts were correct at the end of the case. Dermabond was applied over the skin. An island dressing was placed over the wound. The patient was transferred back to a bed and brought to the post-anesthesia care unit by anesthesia staff in stable condition.  Post-operative plan: The patient will recover in the post-anesthesia care unit with plan to return home. His pain will need to be under control, his vitals stable, he will need to void spontaneously, his neuro exam stable from pre-op, and he will need to ambulate the halls before going. The patient will be out of bed as tolerated with no brace. The patient will be seen in the office at approximately 2 weeks from his date of surgery.   Ozell Ada, MD Orthopedic Surgeon

## 2024-05-09 NOTE — Transfer of Care (Signed)
 Immediate Anesthesia Transfer of Care Note  Patient: Andrew Sharp  Procedure(s) Performed: DECOMPRESSIVE LUMBAR LAMINECTOMY LEVEL 1  Patient Location: PACU  Anesthesia Type:General  Level of Consciousness: awake, alert , oriented, and drowsy  Airway & Oxygen Therapy: Patient Spontanous Breathing and Patient connected to face mask oxygen  Post-op Assessment: Report given to RN, Post -op Vital signs reviewed and stable, and Patient moving all extremities X 4  Post vital signs: Reviewed and stable  Last Vitals:  Vitals Value Taken Time  BP    Temp 37 C 05/09/24 15:05  Pulse 87 05/09/24 15:14  Resp 12 05/09/24 15:14  SpO2 95 % 05/09/24 15:14  Vitals shown include unfiled device data.  Last Pain:  Vitals:   05/09/24 0941  TempSrc:   PainSc: 3       Patients Stated Pain Goal: 0 (05/09/24 0941)  Complications: No notable events documented.

## 2024-05-09 NOTE — Discharge Instructions (Signed)
 Orthopedic Surgery Discharge Instructions  Patient name: Andrew Sharp Procedure Performed: L4/5 laminectomy Date of Surgery: 05/09/2024 Surgeon: Ozell Ada, MD  Pre-operative Diagnosis: lumbar stenosis with neurogenic claudication Post-operative Diagnosis: same as above  Discharge Date: 05/09/2024 Discharged to: home Discharge Condition: stable  Activity: You should refrain from bending, lifting, or twisting with objects greater than ten pounds until six weeks after surgery. You are encouraged to walk as much as desired. You can perform household activities such as cleaning dishes, doing laundry, vacuuming, etc. as long as the ten-pound restriction is followed. You do not need to wear a brace during the post-operative period.   Incision Care: Your incision site has a dressing over it. That dressing should remain in place and dry at all times for a total of one week after surgery. After one week, you can remove the dressing. Underneath the dressing, you will find skin glue. You should leave the skin glue in place. It will fall off with time. Do not pick, rub, or scrub at it. Do not put cream or lotion over the surgical area. After one week and once the dressing is off, it is okay to let soap and water run over your incision. Again, do not pick, scrub, or rub at the pieces of tape when bathing. Do not submerge (e.g., take a bath, swim, go in a hot tub, etc.) until six weeks after surgery. There may be some bloody drainage from the incision into the dressing after surgery. This is normal. You do not need to replace the dressing. Continue to leave it in place for the one week as instructed above. Should the dressing become saturated with blood or drainage, please call the office for further instructions.   Medications: You have been prescribed oxycodone. This is a narcotic pain medication and should only be taken as prescribed. You should not drink alcohol or operate heavy machinery (including  driving) while taking this medication. The oxycodone can cause constipation as a side effect. For that reason, you have been prescribed senna and miralax. These are both laxatives. You do not need to take this medication if you develop diarrhea. Should you remain constipated even while taking these medications, please increase the dose of miralax to twice daily. Tylenol  has been prescribed to be taken every 8 hours, which will give you additional pain relief. Robaxin  is a muscle relaxer that has been prescribed to you for muscle spasm type pain. Take this medication as needed.   You can use over-the-counter NSAIDs (ibuprofen, Aleve, Celebrex, naproxen, meloxicam , etc.) for additional pain relief after this surgery starting at 72 hours after surgery. These medications are safe to take with the Tylenol  you have been prescribed. You should not take these medications if you have or have had kidney problems or gastrointestinal ulcers. Take these medications as instructed on the packaging.   In order to set expectations for opioid prescriptions, you will only be prescribed opioids for a total of six weeks after surgery and, at two-weeks after surgery, your opioid prescription will start to tapered (decreased dosage and number of pills). If you have ongoing need for opioid medication six weeks after surgery, you will be referred to pain management. If you are already established with a provider that is giving you opioid medications, you should schedule an appointment with them for six weeks after surgery if you feel you are going to need another prescription. State law only allows for opioid prescriptions one week at a time. If you are running  out of opioid medication near the end of the week, please call the office during business hours before running out so I can send you another prescription.   You may resume any home blood thinners (warfarin, lovenox, apixaban, plavix, xarelto, etc) 72 hours after your surgery.  Take these medications as they were previously prescribed.  Driving: You should not drive while taking narcotic pain medications. You should start getting back to driving slowly and you may want to try driving in a parking lot before doing anything more.   Diet: You are safe to resume your regular diet after surgery.   Reasons to Call the Office After Surgery: You should feel free to call the office with any concerns or questions you have in the post-operative period, but you should definitely notify the office if you develop: -shortness of breath, chest pain, or trouble breathing -excessive bleeding, drainage, redness, or swelling around the surgical site -fevers, chills, or pain that is getting worse with each passing day -persistent nausea or vomiting -new weakness in either leg -new or worsening numbness or tingling in either leg -numbness in the groin, bowel or bladder incontinence -other concerns about your surgery  Follow Up Appointments: You should have an office appointment scheduled for approximately two weeks after surgery. If you do not remember when this appointment is or do not already have it scheduled, please call the office to schedule.   Office Information:  -Ozell Ada, MD -Phone number: [phone] -Address: [address]       [place], [place] [address]

## 2024-05-09 NOTE — Anesthesia Preprocedure Evaluation (Signed)
 Anesthesia Evaluation  Patient identified by MRN, date of birth, ID band Patient awake    Reviewed: Allergy & Precautions, NPO status , Patient's Chart, lab work & pertinent test results  History of Anesthesia Complications Negative for: history of anesthetic complications  Airway Mallampati: I       Dental  (+) Dental Advisory Given, Teeth Intact   Pulmonary neg shortness of breath, sleep apnea and Continuous Positive Airway Pressure Ventilation , neg COPD, neg recent URI   breath sounds clear to auscultation       Cardiovascular hypertension, Pt. on medications and Pt. on home beta blockers (-) angina (-) Past MI and (-) CHF  Rhythm:Regular     Neuro/Psych  PSYCHIATRIC DISORDERS Anxiety Depression     Neuromuscular disease    GI/Hepatic Neg liver ROS,GERD  Medicated,,  Endo/Other  negative endocrine ROS    Renal/GU Renal InsufficiencyRenal diseaseLab Results      Component                Value               Date                      NA                       133 (L)             05/09/2024                K                        4.3                 05/09/2024                CO2                      20 (L)              05/09/2024                GLUCOSE                  121 (H)             05/09/2024                BUN                      15                  05/09/2024                CREATININE               1.46 (H)            05/09/2024                CALCIUM                  9.8                 05/09/2024                GFR                      53.90 (L)  12/14/2023                EGFR                     55 (L)              02/13/2024                GFRNONAA                 57 (L)              05/09/2024                Musculoskeletal negative musculoskeletal ROS (+)    Abdominal   Peds  Hematology negative hematology ROS (+) Lab Results      Component                Value               Date                       WBC                      8.6                 05/02/2024                HGB                      14.4                05/02/2024                HCT                      41.7                05/02/2024                MCV                      92.1                05/02/2024                PLT                      262                 05/02/2024              Anesthesia Other Findings   Reproductive/Obstetrics                              Anesthesia Physical Anesthesia Plan  ASA: 2  Anesthesia Plan: General   Post-op Pain Management: Ofirmev  IV (intra-op)* and Ketamine IV*   Induction: Intravenous  PONV Risk Score and Plan: 2 and Ondansetron  and Dexamethasone   Airway Management Planned: Oral ETT  Additional Equipment: None  Intra-op Plan:   Post-operative Plan: Extubation in OR  Informed Consent: I have reviewed the patients History and Physical, chart, labs and discussed the procedure including the risks, benefits and alternatives for the proposed anesthesia with the patient or authorized representative who has indicated his/her understanding and acceptance.  Dental advisory given  Plan Discussed with: CRNA  Anesthesia Plan Comments:          Anesthesia Quick Evaluation

## 2024-05-09 NOTE — Progress Notes (Signed)
 Orthopedic Surgery Post-operative Progress Note  Assessment: Patient is a 53 y.o. male who is recovering after L4/5 laminectomy   Plan: -Operative plans complete -Drains: none -Out of bed as tolerated, no brace -No bending/lifting/twisting greater than 10 pounds -Pain control -Regular diet -No chemoprophylaxis for dvt or antiplatelets for 72 hours after surgery -Antibiotics x2 post-operative doses -Disposition: to home from PACU  ___________________________________________________________________________   Subjective: No acute events since surgery. Recovering in PACU. Pain controlled.   Objective:  General: no acute distress, appropriate affect Neurologic: alert, answering questions appropriately, following commands Respiratory: unlabored breathing on room air Skin: dressing clear/dry/intact  MSK (spine):  -Strength exam      Right  Left  EHL    5/5  5/5 TA    5/5  5/5 GSC    5/5  5/5 Knee extension  5/5  5/5 Hip flexion   5/5  5/5  -Sensory exam    Sensation intact to light touch in L3-S1 nerve distributions of bilateral lower extremities   Patient name: Andrew Sharp Patient MRN: [id number] Date: 05/09/24

## 2024-05-09 NOTE — Anesthesia Procedure Notes (Signed)
 Procedure Name: Intubation Date/Time: 05/09/2024 11:46 AM  Performed by: Mollie Olivia SAUNDERS, CRNAPre-anesthesia Checklist: Patient identified, Emergency Drugs available, Suction available and Patient being monitored Patient Re-evaluated:Patient Re-evaluated prior to induction Oxygen Delivery Method: Circle system utilized Preoxygenation: Pre-oxygenation with 100% oxygen Induction Type: IV induction Ventilation: Mask ventilation without difficulty Laryngoscope Size: Mac and 4 Grade View: Grade III Tube type: Oral Tube size: 7.5 mm Number of attempts: 1 Airway Equipment and Method: Stylet Placement Confirmation: ETT inserted through vocal cords under direct vision, positive ETCO2 and breath sounds checked- equal and bilateral Secured at: 24 cm Tube secured with: Tape Dental Injury: Teeth and Oropharynx as per pre-operative assessment  Difficulty Due To: Difficulty was unanticipated, Difficult Airway- due to anterior larynx, Difficult Airway- due to large tongue and Difficult Airway- due to immobile epiglottis Future Recommendations: Recommend- induction with short-acting agent, and alternative techniques readily available

## 2024-05-12 ENCOUNTER — Encounter: Payer: Self-pay | Admitting: Orthopedic Surgery

## 2024-05-12 ENCOUNTER — Encounter (HOSPITAL_COMMUNITY): Payer: Self-pay | Admitting: Orthopedic Surgery

## 2024-05-13 NOTE — Anesthesia Postprocedure Evaluation (Signed)
 Anesthesia Post Note  Patient: Andrew Sharp  Procedure(s) Performed: DECOMPRESSIVE LUMBAR LAMINECTOMY LEVEL 1     Patient location during evaluation: PACU Anesthesia Type: General Level of consciousness: awake and alert Pain management: pain level controlled Vital Signs Assessment: post-procedure vital signs reviewed and stable Respiratory status: spontaneous breathing, nonlabored ventilation and respiratory function stable Cardiovascular status: blood pressure returned to baseline and stable Postop Assessment: no apparent nausea or vomiting Anesthetic complications: no   No notable events documented.           Reese Stockman

## 2024-05-15 MED ORDER — FLUCONAZOLE 100 MG PO TABS: 100.0000 mg | ORAL_TABLET | Freq: Every day | ORAL | 13 refills | Status: AC

## 2024-05-17 ENCOUNTER — Telehealth: Payer: Self-pay | Admitting: Orthopedic Surgery

## 2024-05-17 MED ORDER — DOXYCYCLINE HYCLATE 100 MG PO TABS
100.0000 mg | ORAL_TABLET | Freq: Two times a day (BID) | ORAL | 0 refills | Status: AC
Start: 2024-05-17 — End: 2024-05-31

## 2024-05-17 NOTE — Telephone Encounter (Signed)
 Orthopedic Telephone Note  Patient with continued drainage. Surgery was 7/18. Said he notes it to be mostly clear, yellowish with small amount of blood. No fevers or chills. I told him I would send in an antibiotic which was sent in tonight. Will either see him tomorrow in office or Monday at 8:30. He was okay with either option. I will give him a call tomorrow to finalize that plan. Told him to call me back if there are any new or further issues.   Ozell DELENA Ada, MD Orthopedic Surgeon

## 2024-05-18 ENCOUNTER — Encounter: Payer: Self-pay | Admitting: Orthopedic Surgery

## 2024-05-19 ENCOUNTER — Ambulatory Visit: Admitting: Orthopedic Surgery

## 2024-05-19 DIAGNOSIS — Z9889 Other specified postprocedural states: Secondary | ICD-10-CM

## 2024-05-19 NOTE — Progress Notes (Signed)
 Orthopedic Surgery Post-operative Office Visit  Procedure: L4/5 laminectomy Date of Surgery: 05/09/2024 (~10 days post-op)  Assessment: Patient is a 53 y.o. who has noticed improvement in his pain but is having persistent drainage   Plan: -Operative plans complete -Out of bed as tolerated, no brace -No bending/lifting/twisting greater than 10 pounds -Can continue to let soap/water  run over the incision but do not submerge -Continue with the doxycycline  until course is completed -Applied Dermabond over the incision in the office today -If it continues to drain, will likely need operative irrigation and debridement -Pain management: Tylenol  as needed -Return to office in 2 weeks, x-rays needed at next visit: none  ___________________________________________________________________________   Subjective: Patient has noticed improvement in his pain since surgery and it has been gradually improving with time.  However, he has noticed drainage around his lumbar incision that has persisted.  He had previously called the on-call number and I have prescribed him doxycycline .  He has been taking that now for a day and a half.  He has not noticed any redness around the incision.  The drainage has been serosanguineous.  Objective:  General: no acute distress, appropriate affect Neurologic: alert, answering questions appropriately, following commands Respiratory: unlabored breathing on room air Skin: Incision is well-approximated with a small area where there is expressible drainage that is serosanguineous.  The area where it is draining is in the middle of the incision.  There is no erythema.  There is no active drainage but it is expressed was noted before.  MSK (spine):  -Strength exam      Left  Right  EHL    5/5  5/5 TA    5/5  5/5 GSC    5/5  5/5 Knee extension  5/5  5/5 Hip flexion   5/5  5/5  -Sensory exam    Sensation intact to light touch in L3-S1 nerve distributions of  bilateral lower extremities  Imaging: None obtained at today's visit   Patient name: Andrew Sharp Patient MRN: [id number] Date of visit: 05/19/24

## 2024-05-20 ENCOUNTER — Encounter: Payer: Self-pay | Admitting: Family Medicine

## 2024-05-22 ENCOUNTER — Encounter: Admitting: Orthopedic Surgery

## 2024-06-04 ENCOUNTER — Ambulatory Visit: Admitting: Orthopedic Surgery

## 2024-06-04 DIAGNOSIS — Z9889 Other specified postprocedural states: Secondary | ICD-10-CM

## 2024-06-04 NOTE — Progress Notes (Signed)
 Orthopedic Surgery Post-operative Office Visit   Procedure: L4/5 laminectomy Date of Surgery: 05/09/2024 (~4 weeks post-op)   Assessment: Patient is a 53 y.o. who is still having lower back pain and leg pain after surgery. Drainage has stopped     Plan: -Operative plans complete -Out of bed as tolerated, no brace -No bending/lifting/twisting greater than 10 pounds -Okay to submerge wound at this time -Will order a nerve study at our next visit if he is still having the leg pain to work it up further -Pain management: Tylenol  as needed -Return to office in 4 weeks, x-rays needed at next visit: none   ___________________________________________________________________________     Subjective: Patient still with low back pain that radiates into his right buttock and right posterior thigh.  He also has decreased sensation in that area.  He has not noticed any significant difference since the surgery.  He has not developed any new symptoms.  The drainage from his back is stopped and he has not noticed any further drainage.  He has not noticed any redness around the incision   Objective:   General: no acute distress, appropriate affect Neurologic: alert, answering questions appropriately, following commands Respiratory: unlabored breathing on room air Skin: Incision is well-healed with no erythema, induration, active/expressible drainage   MSK (spine):   -Strength exam                                                   Left                  Right   EHL                              5/5                  5/5 TA                                 5/5                  5/5 GSC                             5/5                  5/5 Knee extension            5/5                  5/5 Hip flexion                    5/5                  5/5   -Sensory exam                           Sensation intact to light touch in L3-S1 nerve distributions of bilateral lower extremities   Imaging: None  obtained at today's visit     Patient name: Andrew Sharp Patient MRN: [id number] Date of visit: 06/04/24

## 2024-06-18 ENCOUNTER — Ambulatory Visit: Admitting: Pulmonary Disease

## 2024-06-18 LAB — LAB REPORT - SCANNED
A1c: 6.1
Albumin, Urine POC: 0.2
Albumin/Creatinine Ratio, Urine, POC: 1.5
Creatinine, POC: 131 mg/dL

## 2024-06-25 ENCOUNTER — Ambulatory Visit: Admitting: Family Medicine

## 2024-07-07 ENCOUNTER — Ambulatory Visit (INDEPENDENT_AMBULATORY_CARE_PROVIDER_SITE_OTHER): Admitting: Orthopedic Surgery

## 2024-07-07 DIAGNOSIS — M5416 Radiculopathy, lumbar region: Secondary | ICD-10-CM

## 2024-07-07 NOTE — Progress Notes (Signed)
 Orthopedic Surgery Post-operative Office Visit   Procedure: L4/5 laminectomy Date of Surgery: 05/09/2024 (~8 weeks post-op)   Assessment: Patient is a 53 y.o. who is still having lower back pain and leg pain after surgery     Plan: -No spine specific precautions at this time -Patient is still having symptoms in his leg, so recommend EMG/NCS to workup further -Can use Tylenol  and ibuprofen to help with the pain -If he is not doing any better at her next visit, we will order an MRI of the lumbar spine to evaluate further -Return to office in 4 weeks, x-rays needed at next visit: AP/lateral/flex/ex lumbar   ___________________________________________________________________________     Subjective: Patient states he is maybe slightly better since he was last seen but he still has low back pain that radiates into the right buttock and right posterior thigh.  He said he noticed that the other night when he was watching volleyball.  He was standing for about 20 minutes and then pain started and he had to sit down.  He does not have any pain radiating into the left lower extremity.  He still feels limited in his activities as a result of this pain.  He has not noticed any redness or drainage around his incision.   Objective:   General: no acute distress, appropriate affect Neurologic: alert, answering questions appropriately, following commands Respiratory: unlabored breathing on room air Skin: Incision is well-healed   MSK (spine):   -Strength exam                                                   Left                  Right   EHL                              5/5                  5/5 TA                                 5/5                  5/5 GSC                             5/5                  5/5 Knee extension            5/5                  5/5 Hip flexion                    5/5                  5/5   -Sensory exam                           Sensation intact to light touch in  L3-S1 nerve distributions of bilateral lower extremities   Imaging: None obtained at today's visit  Patient name: Andrew Sharp Patient MRN: [id number] Date of visit: 07/07/24

## 2024-07-17 ENCOUNTER — Other Ambulatory Visit: Payer: Self-pay

## 2024-07-17 ENCOUNTER — Encounter: Payer: Self-pay | Admitting: Orthopedic Surgery

## 2024-07-17 DIAGNOSIS — R202 Paresthesia of skin: Secondary | ICD-10-CM

## 2024-08-04 ENCOUNTER — Ambulatory Visit: Admitting: Family Medicine

## 2024-08-04 ENCOUNTER — Encounter: Payer: Self-pay | Admitting: Family Medicine

## 2024-08-04 VITALS — BP 124/82 | HR 87 | Temp 98.2°F | Wt 257.0 lb

## 2024-08-04 DIAGNOSIS — I10 Essential (primary) hypertension: Secondary | ICD-10-CM

## 2024-08-04 DIAGNOSIS — N1831 Chronic kidney disease, stage 3a: Secondary | ICD-10-CM | POA: Insufficient documentation

## 2024-08-04 DIAGNOSIS — R7303 Prediabetes: Secondary | ICD-10-CM | POA: Diagnosis not present

## 2024-08-04 NOTE — Progress Notes (Signed)
   Subjective:    Patient ID: Andrew Sharp, male    DOB: January 27, 1971, 53 y.o.   MRN: [id number]  HPI Here to follow up on HTN and to go over some lab results which were drawn on 06-18-24. He had an exam to apply for life insurance, and he wants to discuss these. He feels fine in general, though he still has some low back pain after his laminectomy surgery on 05-09-24. These labs were remarkable for an A1c of 6.1%, LDL of 58, and creatinine of 1.5. they did not check a GFR, but this was 57 on 05-09-24. [facility]. He is concerned about his kidneys since his mother was on dialysis for years before she passed away.    Review of Systems  Constitutional: Negative.   Respiratory: Negative.    Cardiovascular: Negative.   Gastrointestinal: Negative.   Genitourinary: Negative.   Musculoskeletal:  Positive for back pain.  Neurological: Negative.        Objective:   Physical Exam Constitutional:      General: He is not in acute distress.    Appearance: Normal appearance.  Cardiovascular:     Rate and Rhythm: Normal rate and regular rhythm.     Pulses: Normal pulses.     Heart sounds: Normal heart sounds.  Pulmonary:     Effort: Pulmonary effort is normal.     Breath sounds: Normal breath sounds.  Neurological:     Mental Status: He is alert.           Assessment & Plan:  He has prediabetes, and this has been stable. He will follow up with the [facility]. He has stage 3 CKD, so we will refer him to Nephrology to evaluate. His HTN is stable.  Garnette Olmsted, MD

## 2024-08-12 ENCOUNTER — Ambulatory Visit: Admitting: Pulmonary Disease

## 2024-08-13 ENCOUNTER — Encounter: Payer: Self-pay | Admitting: Pulmonary Disease

## 2024-08-13 ENCOUNTER — Ambulatory Visit: Admitting: Pulmonary Disease

## 2024-08-13 VITALS — BP 132/86 | HR 91 | Temp 98.1°F | Ht 69.0 in | Wt 256.0 lb

## 2024-08-13 DIAGNOSIS — G4719 Other hypersomnia: Secondary | ICD-10-CM

## 2024-08-13 DIAGNOSIS — G4733 Obstructive sleep apnea (adult) (pediatric): Secondary | ICD-10-CM | POA: Diagnosis not present

## 2024-08-13 MED ORDER — MODAFINIL 200 MG PO TABS: 200.0000 mg | ORAL_TABLET | Freq: Every day | ORAL | 1 refills | Status: AC

## 2024-08-13 NOTE — Patient Instructions (Signed)
 Download from machine shows it is working well  Continue to work on efforts to help you get better sleep - look up more information about cognitive behavioral therapy  Will refill your prescription for Provigil   I will see you back in 6 months  Send us  a message if you feel that you are still not able to sleep despite change in habits that can help-then we can try a medication

## 2024-08-13 NOTE — Progress Notes (Signed)
 Andrew Sharp    [id number]    08-13-1971  Primary Care Physician:Fry, Garnette LABOR, MD  Referring Physician: Johnny Garnette LABOR, MD [address] [place],  [place] [address]  Chief complaint:   History of snoring, witnessed apneas Diagnosed with sleep apnea and has been using CPAP  HPI: Patient with daytime sleepiness despite optimally treated sleep sleep disordered breathing  Continues to use Provigil  daily which helps his daytime sleepiness  Remains very compliant with his CPAP  Wakes up feeling rested  Has been having more difficulty sleeping at night Difficulty getting to sleep and difficulty staying asleep He recognizes that some stress may be contributing to this and is working on this  Denies any significant shortness of breath His weight has been stable  Blood pressure controlled   Outpatient Encounter Medications as of 08/13/2024  Medication Sig   amLODipine  (NORVASC ) 10 MG tablet TAKE 1 TABLET(10 MG) BY MOUTH DAILY   buPROPion  (WELLBUTRIN  XL) 150 MG 24 hr tablet Take 1 tablet (150 mg total) by mouth daily.   cloNIDine  (CATAPRES ) 0.1 MG tablet TAKE 1 TABLET(0.1 MG) BY MOUTH TWICE DAILY   fluconazole  (DIFLUCAN ) 100 MG tablet Take 1 tablet (100 mg total) by mouth daily.   hydrALAZINE  (APRESOLINE ) 100 MG tablet Take 1 tablet (100 mg total) by mouth 3 (three) times daily.   losartan  (COZAAR ) 100 MG tablet Take 1 tablet (100 mg total) by mouth daily.   metoprolol  succinate (TOPROL -XL) 100 MG 24 hr tablet Take 1 tablet (100 mg total) by mouth daily. Take with or immediately following a meal.   Misc Natural Products (BEET ROOT PO) Take 1 tablet by mouth daily.   modafinil  (PROVIGIL ) 200 MG tablet Take 1 tablet (200 mg total) by mouth daily.   Multiple Vitamin (MULTIVITAMIN WITH MINERALS) TABS tablet Take 1 tablet by mouth daily.   naproxen sodium (ALEVE) 220 MG tablet Take 660 mg by mouth daily.   omeprazole  (PRILOSEC) 20 MG capsule TAKE 1 CAPSULE(20  MG) BY MOUTH IN THE MORNING AND AT BEDTIME   pregabalin  (LYRICA ) 75 MG capsule TAKE 1 CAPSULE(75 MG) BY MOUTH TWICE DAILY   spironolactone  (ALDACTONE ) 50 MG tablet Take 1 tablet (50 mg total) by mouth daily.   No facility-administered encounter medications on file as of 08/13/2024.    Allergies as of 08/13/2024   (No Known Allergies)    Past Medical History:  Diagnosis Date   Anxiety    Esophageal stricture    Fatty liver    GERD (gastroesophageal reflux disease)    High cholesterol    History of kidney stones    Hypertension    Joint pain    Pre-diabetes    Sleep apnea    Swelling of lower extremity     Past Surgical History:  Procedure Laterality Date   COLONOSCOPY  02/05/2023   per Dr. Leigh, tubular adenoma, repeat in 7 yrs   CT CORONARY CA SCORING  09/19/2011   for chest pains, negative    DECOMPRESSIVE LUMBAR LAMINECTOMY LEVEL 1 N/A 05/09/2024   Procedure: DECOMPRESSIVE LUMBAR LAMINECTOMY LEVEL 1;  Surgeon: Georgina Ozell LABOR, MD;  Location: [facility] OR;  Service: Orthopedics;  Laterality: N/A;  L4-5 LAMINECTOMY   ESOPHAGOGASTRODUODENOSCOPY (EGD) WITH PROPOFOL  N/A 04/09/2023   per Dr. Leigh, hyperplastic polyp, no follow up needed   HEMOSTASIS CLIP PLACEMENT  04/09/2023   Procedure: HEMOSTASIS CLIP PLACEMENT;  Surgeon: Leigh Elspeth SQUIBB, MD;  Location: [facility];  Service:  Gastroenterology;;   KNEE ARTHROPLASTY Left    POLYPECTOMY  04/09/2023   Procedure: POLYPECTOMY;  Surgeon: Leigh Elspeth SQUIBB, MD;  Location: [facility];  Service: Gastroenterology;;   MATIAS  04/09/2023   Procedure: MATIAS;  Surgeon: Leigh Elspeth SQUIBB, MD;  Location: [facility];  Service: Gastroenterology;;    Family History  Problem Relation Age of Onset   Heart disease Mother    Kidney disease Mother    Diabetes Mother    High blood pressure Mother    High Cholesterol Mother    Heart disease Father    Diabetes Brother        MOTHER,AUNT   Pancreatic  cancer Maternal Grandmother    Colon polyps Maternal Grandfather    Prostate cancer Maternal Grandfather    Colon cancer Maternal Aunt    Crohn's disease Other        niece   Esophageal cancer Neg Hx    Rectal cancer Neg Hx    Stomach cancer Neg Hx     Social History   Socioeconomic History   Marital status: Married    Spouse name: Not on file   Number of children: 1   Years of education: Not on file   Highest education level: Associate degree: occupational, Scientist, product/process development, or vocational program  Occupational History   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[employer]   Tobacco Use   Smoking status: Never   Smokeless tobacco: Never  Vaping Use   Vaping status: Never Used  Substance and Sexual Activity   Alcohol use: No    Alcohol/week: 0.0 standard drinks of alcohol   Drug use: No   Sexual activity: Not on file  Other Topics Concern   Not on file  Social History Narrative   Not on file   Social Drivers of Health   Financial Resource Strain: Medium Risk (08/04/2024)   Overall Financial Resource Strain (CARDIA)    Difficulty of Paying Living Expenses: Somewhat hard  Food Insecurity: Food Insecurity Present (08/04/2024)   Hunger Vital Sign    Worried About Running Out of Food in the Last Year: Sometimes true    Ran Out of Food in the Last Year: Never true  Transportation Needs: Patient Declined (08/04/2024)   PRAPARE - Transportation    Lack of Transportation (Medical): Patient declined    Lack of Transportation (Non-Medical): Patient declined  Physical Activity: Inactive (08/04/2024)   Exercise Vital Sign    Days of Exercise per Week: 0 days    Minutes of Exercise per Session: Not on file  Stress: Stress Concern Present (08/04/2024)   [employer] of Occupational Health - Occupational Stress Questionnaire    Feeling of Stress: Rather much  Social Connections: Unknown (08/04/2024)   Social Connection and Isolation Panel    Frequency of Communication with  Friends and Family: Patient declined    Frequency of Social Gatherings with Friends and Family: Patient declined    Attends Religious Services: Patient declined    Database administrator or Organizations: Patient declined    Attends Banker Meetings: Not on file    Marital Status: Patient declined  Intimate Partner Violence: Unknown (05/04/2022)   Received from [facility]   HITS    Physically Hurt: Not on file    Insult or Talk Down To: Not on file    Threaten Physical Harm: Not on file    Scream or Curse: Not on file    Review of Systems  Constitutional:  Positive  for fatigue.  Respiratory:  Positive for apnea.   Psychiatric/Behavioral:  Positive for sleep disturbance.     Vitals:   08/13/24 0955  BP: 132/86  Pulse: 91  Temp: 98.1 F (36.7 C)  SpO2: 98%     Physical Exam Constitutional:      Appearance: He is obese.  HENT:     Head: Normocephalic.  Eyes:     General: No scleral icterus.    Pupils: Pupils are equal, round, and reactive to light.  Cardiovascular:     Rate and Rhythm: Normal rate and regular rhythm.     Heart sounds: No murmur heard.    No friction rub.  Pulmonary:     Effort: No respiratory distress.     Breath sounds: No stridor. No wheezing or rhonchi.  Musculoskeletal:     Cervical back: No rigidity or tenderness.  Neurological:     Mental Status: He is alert.  Psychiatric:        Mood and Affect: Mood normal.    CPAP compliance shows 100% compliance average use of 7 hours 46 minutes AutoSet 5-15 95 percentile pressure of 10.5 Residual AHI of 2.2  Assessment:   Mild obstructive sleep apnea optimally treated with CPAP therapy  Daytime sleepiness despite CPAP use - Benefiting from Provigil  - No significant side effect from Provigil   Sleep onset and sleep maintenance insomnia - Likely related to stress -Discussed cognitive behavioral therapy - Information material provided   Plan/Recommendations:  Continue CPAP  nightly  Refills for Provigil  provided  Information material regarding cognitive behavioral therapy for insomnia provided - Encouraged to give us  a call if he continues to have significant difficulty sleeping despite trying to adjust with cognitive behavioral therapy  Will follow him up in about 6 months  I did reassure him that he can give us  a call if he feels he is still not able to sleep despite making changes and we can try medication at that time  I spent 32 minutes reviewing records, interviewing/examining patient, and managing orders.   Jennet Epley MD  Pulmonary and Critical Care 08/13/2024, 9:56 AM  CC: Johnny Garnette LABOR, MD

## 2024-08-15 ENCOUNTER — Encounter: Payer: Self-pay | Admitting: Family Medicine

## 2024-08-21 ENCOUNTER — Other Ambulatory Visit: Payer: Self-pay | Admitting: Orthopedic Surgery

## 2024-08-25 ENCOUNTER — Encounter: Payer: Self-pay | Admitting: Radiology

## 2024-09-22 ENCOUNTER — Telehealth: Payer: Self-pay

## 2024-09-22 ENCOUNTER — Other Ambulatory Visit (HOSPITAL_COMMUNITY): Payer: Self-pay

## 2024-09-22 MED ORDER — WEGOVY 0.25 MG/0.5ML ~~LOC~~ SOAJ: 0.2500 mg | SUBCUTANEOUS | 5 refills | Status: AC

## 2024-09-22 NOTE — Telephone Encounter (Signed)
 I sent in a RX for [facility] instead

## 2024-09-22 NOTE — Telephone Encounter (Signed)
 Ozempic Brena is approved exclusively as an adjunct to diet and exercise to improve glycemic  control in adults with type 2 diabetes mellitus. A review of patient's medical chart reveals no  documented diagnosis of type 2 diabetes or an A1C indicative of diabetes. Therefore, they do not  currently meet the criteria for prior authorization of this medication. If clinically appropriate, alternative  options such as Saxenda, Zepbound, or Wegovy  may be considered for this patient.   Patient Glucose 79 08/04/24. A1C 6.4 12/14/23.

## 2024-09-25 ENCOUNTER — Other Ambulatory Visit (HOSPITAL_COMMUNITY): Payer: Self-pay

## 2024-09-25 ENCOUNTER — Ambulatory Visit: Payer: Self-pay | Admitting: Neurology

## 2024-09-25 ENCOUNTER — Telehealth: Payer: Self-pay

## 2024-09-25 DIAGNOSIS — R202 Paresthesia of skin: Secondary | ICD-10-CM | POA: Diagnosis not present

## 2024-09-25 NOTE — Procedures (Signed)
  [facility]  [address] [place], [address]  [place], [place] [address] Tel: [phone] Fax: [phone] Test Date:  09/25/2024  Patient: Andrew Sharp DOB: [birth year] Physician: Tonita Blanch, DO  Sex: Male Height: 5' 9 Ref Phys: Ozell Ada, MD  ID#: [id number]   Technician:    History: This is a 53 year old man s/p L4-L5 laminectomy referred for evaluation of right leg pain.  NCV & EMG Findings: Extensive electrodiagnostic testing of the right lower extremity shows:  Right sural and superficial peroneal sensory responses are within normal limits. Right peroneal motor response is absent at the extensor digitorum brevis, normal at the tibialis anterior; in isolation, these findings are nonspecific.  Right tibial motor response is within normal limits. Right tibial H reflex study is within normal limits. There is no evidence of active or chronic motor axonal loss changes affecting any of the tested muscles.  Motor unit configuration and recruitment pattern is within normal limits.  Impression: This is a normal study of the right lower extremity.  In particular, previously seen L4-L5 radiculopathy on study dated 03/08/2023 has resolved.   ___________________________ Tonita Blanch, DO    Nerve Conduction Studies   Stim Site NR Peak (ms) Norm Peak (ms) O-P Amp (V) Norm O-P Amp  Right Sup Peroneal Anti Sensory (Ant Lat Mall)  32 C  12 cm    2.5 <4.6 8.1 >4  Right Sural Anti Sensory (Lat Mall)  32 C  Calf    2.2 <4.6 10.0 >4     Stim Site NR Onset (ms) Norm Onset (ms) O-P Amp (mV) Norm O-P Amp Site1 Site2 Delta-0 (ms) Dist (cm) Vel (m/s) Norm Vel (m/s)  Right Peroneal Motor (Ext Dig Brev)  32 C  Ankle *NR  <6.0  >2.5 B Fib Ankle  0.0  >40  B Fib *NR     Poplt B Fib  0.0  >40  Poplt *NR            Right Peroneal TA Motor (Tib Ant)  32 C  Fib Head    2.8 <4.5 6.1 >3 Poplit Fib Head 1.7 10.0 59 >40  Poplit    4.5 <5.7 5.8         Right Tibial Motor (Abd Hall  Brev)  32 C  Ankle    4.1 <6.0 8.0 >4 Knee Ankle 10.2 45.0 44 >40  Knee    14.3  6.4          Electromyography   Side Muscle Ins.Act Fibs Fasc Recrt Amp Dur Poly Activation Comment  Right AntTibialis Nml Nml Nml Nml Nml Nml Nml Nml N/A  Right Gastroc Nml Nml Nml Nml Nml Nml Nml Nml N/A  Right Flex Dig Long Nml Nml Nml Nml Nml Nml Nml Nml N/A  Right RectFemoris Nml Nml Nml Nml Nml Nml Nml Nml N/A  Right BicepsFemS Nml Nml Nml Nml Nml Nml Nml Nml N/A  Right GluteusMed Nml Nml Nml Nml Nml Nml Nml Nml N/A      Waveforms:

## 2024-09-25 NOTE — Telephone Encounter (Signed)
 Prior auth needed for provigil  200mg 

## 2024-09-26 LAB — LAB REPORT - SCANNED
Albumin, Urine POC: 24.4
Creatinine, POC: 370.6 mg/dL
EGFR: 58
Microalb Creat Ratio: 7

## 2024-10-07 ENCOUNTER — Encounter: Payer: Self-pay | Admitting: Family Medicine

## 2024-10-08 ENCOUNTER — Other Ambulatory Visit (HOSPITAL_COMMUNITY): Payer: Self-pay

## 2024-10-08 ENCOUNTER — Telehealth: Payer: Self-pay

## 2024-10-08 NOTE — Telephone Encounter (Signed)
*  Pulm  Pharmacy Patient Advocate Encounter   Received notification from Fax that prior authorization for Modafinil  200mg   is required/requested.   Insurance verification completed.   The patient is insured through [facility] MEDICAID.   Per test claim: PA required; PA submitted to above mentioned insurance via Latent Key/confirmation #/EOC [id number] Status is pending

## 2024-10-08 NOTE — Telephone Encounter (Signed)
 Your request has been approved Approved. MODAFINIL  200MG  Tablet is approved from 10/08/2024 to 10/08/2025. All strengths of the drug are approved. Authorization Expiration12/17/2026

## 2024-10-11 ENCOUNTER — Other Ambulatory Visit: Payer: Self-pay | Admitting: Family Medicine

## 2024-10-11 DIAGNOSIS — I10 Essential (primary) hypertension: Secondary | ICD-10-CM

## 2024-10-21 ENCOUNTER — Encounter: Payer: Self-pay | Admitting: Orthopedic Surgery

## 2024-11-05 ENCOUNTER — Other Ambulatory Visit: Payer: Self-pay | Admitting: Orthopedic Surgery

## 2024-11-10 ENCOUNTER — Encounter: Payer: Self-pay | Admitting: Radiology

## 2024-11-13 ENCOUNTER — Other Ambulatory Visit (HOSPITAL_COMMUNITY): Payer: Self-pay
